# Patient Record
Sex: Female | Born: 1975 | Race: Black or African American | Hispanic: No | Smoking: Former smoker
Health system: Southern US, Community
[De-identification: ages and names within clinical notes are randomized; demographics above are authoritative.]

## PROBLEM LIST (undated history)

## (undated) DIAGNOSIS — N809 Endometriosis, unspecified: Secondary | ICD-10-CM

## (undated) DIAGNOSIS — S83106A Unspecified dislocation of unspecified knee, initial encounter: Secondary | ICD-10-CM

## (undated) DIAGNOSIS — G5600 Carpal tunnel syndrome, unspecified upper limb: Secondary | ICD-10-CM

## (undated) DIAGNOSIS — K5900 Constipation, unspecified: Secondary | ICD-10-CM

## (undated) DIAGNOSIS — I10 Essential (primary) hypertension: Secondary | ICD-10-CM

## (undated) DIAGNOSIS — G459 Transient cerebral ischemic attack, unspecified: Secondary | ICD-10-CM

## (undated) DIAGNOSIS — K5790 Diverticulosis of intestine, part unspecified, without perforation or abscess without bleeding: Secondary | ICD-10-CM

## (undated) DIAGNOSIS — I639 Cerebral infarction, unspecified: Secondary | ICD-10-CM

## (undated) DIAGNOSIS — G9332 Myalgic encephalomyelitis/chronic fatigue syndrome: Secondary | ICD-10-CM

## (undated) DIAGNOSIS — M549 Dorsalgia, unspecified: Secondary | ICD-10-CM

## (undated) DIAGNOSIS — I83893 Varicose veins of bilateral lower extremities with other complications: Secondary | ICD-10-CM

## (undated) DIAGNOSIS — R079 Chest pain, unspecified: Secondary | ICD-10-CM

## (undated) DIAGNOSIS — N80129 Deep endometriosis of ovary, unspecified ovary: Secondary | ICD-10-CM

## (undated) DIAGNOSIS — M779 Enthesopathy, unspecified: Secondary | ICD-10-CM

## (undated) DIAGNOSIS — R002 Palpitations: Secondary | ICD-10-CM

## (undated) DIAGNOSIS — R1310 Dysphagia, unspecified: Secondary | ICD-10-CM

## (undated) DIAGNOSIS — M2550 Pain in unspecified joint: Secondary | ICD-10-CM

## (undated) DIAGNOSIS — M797 Fibromyalgia: Secondary | ICD-10-CM

## (undated) DIAGNOSIS — F419 Anxiety disorder, unspecified: Secondary | ICD-10-CM

## (undated) DIAGNOSIS — K589 Irritable bowel syndrome without diarrhea: Secondary | ICD-10-CM

## (undated) DIAGNOSIS — O2230 Deep phlebothrombosis in pregnancy, unspecified trimester: Secondary | ICD-10-CM

## (undated) DIAGNOSIS — M1990 Unspecified osteoarthritis, unspecified site: Secondary | ICD-10-CM

## (undated) DIAGNOSIS — G589 Mononeuropathy, unspecified: Secondary | ICD-10-CM

## (undated) DIAGNOSIS — R0602 Shortness of breath: Secondary | ICD-10-CM

## (undated) DIAGNOSIS — R5382 Chronic fatigue, unspecified: Secondary | ICD-10-CM

## (undated) DIAGNOSIS — G4730 Sleep apnea, unspecified: Secondary | ICD-10-CM

## (undated) DIAGNOSIS — E7800 Pure hypercholesterolemia, unspecified: Secondary | ICD-10-CM

## (undated) DIAGNOSIS — K760 Fatty (change of) liver, not elsewhere classified: Secondary | ICD-10-CM

## (undated) DIAGNOSIS — K829 Disease of gallbladder, unspecified: Secondary | ICD-10-CM

## (undated) DIAGNOSIS — U071 COVID-19: Secondary | ICD-10-CM

## (undated) DIAGNOSIS — E119 Type 2 diabetes mellitus without complications: Secondary | ICD-10-CM

## (undated) DIAGNOSIS — N3010 Interstitial cystitis (chronic) without hematuria: Secondary | ICD-10-CM

## (undated) DIAGNOSIS — E559 Vitamin D deficiency, unspecified: Secondary | ICD-10-CM

## (undated) DIAGNOSIS — F32A Depression, unspecified: Secondary | ICD-10-CM

## (undated) DIAGNOSIS — M5412 Radiculopathy, cervical region: Secondary | ICD-10-CM

## (undated) DIAGNOSIS — R12 Heartburn: Secondary | ICD-10-CM

## (undated) DIAGNOSIS — M7989 Other specified soft tissue disorders: Secondary | ICD-10-CM

## (undated) DIAGNOSIS — D649 Anemia, unspecified: Secondary | ICD-10-CM

## (undated) DIAGNOSIS — I82409 Acute embolism and thrombosis of unspecified deep veins of unspecified lower extremity: Secondary | ICD-10-CM

## (undated) HISTORY — DX: Sleep apnea, unspecified: G47.30

## (undated) HISTORY — DX: Dorsalgia, unspecified: M54.9

## (undated) HISTORY — DX: Dysphagia, unspecified: R13.10

## (undated) HISTORY — DX: Vitamin D deficiency, unspecified: E55.9

## (undated) HISTORY — DX: Palpitations: R00.2

## (undated) HISTORY — DX: Depression, unspecified: F32.A

## (undated) HISTORY — DX: Endometriosis, unspecified: N80.9

## (undated) HISTORY — DX: Enthesopathy, unspecified: M77.9

## (undated) HISTORY — DX: Essential (primary) hypertension: I10

## (undated) HISTORY — DX: Unspecified osteoarthritis, unspecified site: M19.90

## (undated) HISTORY — DX: Disease of gallbladder, unspecified: K82.9

## (undated) HISTORY — DX: Other specified soft tissue disorders: M79.89

## (undated) HISTORY — DX: Carpal tunnel syndrome, unspecified upper limb: G56.00

## (undated) HISTORY — DX: Chronic fatigue, unspecified: R53.82

## (undated) HISTORY — DX: Transient cerebral ischemic attack, unspecified: G45.9

## (undated) HISTORY — DX: Pure hypercholesterolemia, unspecified: E78.00

## (undated) HISTORY — DX: Myalgic encephalomyelitis/chronic fatigue syndrome: G93.32

## (undated) HISTORY — DX: Anxiety disorder, unspecified: F41.9

## (undated) HISTORY — DX: Heartburn: R12

## (undated) HISTORY — DX: Acute embolism and thrombosis of unspecified deep veins of unspecified lower extremity: I82.409

## (undated) HISTORY — DX: Cerebral infarction, unspecified: I63.9

## (undated) HISTORY — DX: Radiculopathy, cervical region: M54.12

## (undated) HISTORY — DX: Varicose veins of bilateral lower extremities with other complications: I83.893

## (undated) HISTORY — DX: Fatty (change of) liver, not elsewhere classified: K76.0

## (undated) HISTORY — DX: Unspecified dislocation of unspecified knee, initial encounter: S83.106A

## (undated) HISTORY — DX: Constipation, unspecified: K59.00

## (undated) HISTORY — DX: Chest pain, unspecified: R07.9

## (undated) HISTORY — DX: Anemia, unspecified: D64.9

## (undated) HISTORY — DX: Mononeuropathy, unspecified: G58.9

## (undated) HISTORY — DX: Deep endometriosis of ovary, unspecified ovary: N80.129

## (undated) HISTORY — DX: Shortness of breath: R06.02

## (undated) HISTORY — DX: Pain in unspecified joint: M25.50

---

## 1994-01-09 HISTORY — PX: CHOLECYSTECTOMY: SHX55

## 2007-07-25 ENCOUNTER — Emergency Department: Payer: Self-pay | Admitting: Emergency Medicine

## 2007-11-08 ENCOUNTER — Emergency Department: Payer: Self-pay | Admitting: Emergency Medicine

## 2008-02-10 ENCOUNTER — Emergency Department: Payer: Self-pay | Admitting: Emergency Medicine

## 2008-02-21 ENCOUNTER — Inpatient Hospital Stay: Payer: Self-pay | Admitting: Otolaryngology

## 2008-03-06 ENCOUNTER — Ambulatory Visit: Payer: Self-pay | Admitting: Specialist

## 2008-04-21 ENCOUNTER — Ambulatory Visit: Payer: Self-pay | Admitting: Family

## 2008-07-17 ENCOUNTER — Ambulatory Visit: Payer: Self-pay | Admitting: Otolaryngology

## 2008-07-27 ENCOUNTER — Ambulatory Visit: Payer: Self-pay | Admitting: Otolaryngology

## 2008-10-21 ENCOUNTER — Emergency Department: Payer: Self-pay | Admitting: Emergency Medicine

## 2009-02-25 ENCOUNTER — Ambulatory Visit: Payer: Self-pay | Admitting: Family Medicine

## 2009-10-06 ENCOUNTER — Emergency Department: Payer: Self-pay | Admitting: Emergency Medicine

## 2010-06-06 ENCOUNTER — Ambulatory Visit: Payer: Self-pay | Admitting: Family Medicine

## 2010-06-20 ENCOUNTER — Ambulatory Visit: Payer: Self-pay | Admitting: Family Medicine

## 2011-12-12 ENCOUNTER — Emergency Department: Payer: Self-pay | Admitting: Emergency Medicine

## 2011-12-12 LAB — URINALYSIS, COMPLETE
Bilirubin,UR: NEGATIVE
Ketone: NEGATIVE
Nitrite: NEGATIVE
Ph: 5 (ref 4.5–8.0)
Protein: NEGATIVE
RBC,UR: 1 /HPF (ref 0–5)
Specific Gravity: 1.018 (ref 1.003–1.030)

## 2011-12-12 LAB — COMPREHENSIVE METABOLIC PANEL
Albumin: 3.6 g/dL (ref 3.4–5.0)
Alkaline Phosphatase: 123 U/L (ref 50–136)
BUN: 10 mg/dL (ref 7–18)
Bilirubin,Total: 0.7 mg/dL (ref 0.2–1.0)
Chloride: 106 mmol/L (ref 98–107)
Creatinine: 0.73 mg/dL (ref 0.60–1.30)
Glucose: 108 mg/dL — ABNORMAL HIGH (ref 65–99)
Osmolality: 275 (ref 275–301)
SGPT (ALT): 42 U/L (ref 12–78)
Total Protein: 7.9 g/dL (ref 6.4–8.2)

## 2011-12-12 LAB — CBC
HGB: 12 g/dL (ref 12.0–16.0)
MCH: 26.1 pg (ref 26.0–34.0)
MCHC: 31.7 g/dL — ABNORMAL LOW (ref 32.0–36.0)
MCV: 82 fL (ref 80–100)
RBC: 4.6 10*6/uL (ref 3.80–5.20)
RDW: 15.7 % — ABNORMAL HIGH (ref 11.5–14.5)

## 2011-12-12 LAB — PREGNANCY, URINE: Pregnancy Test, Urine: NEGATIVE m[IU]/mL

## 2013-02-05 ENCOUNTER — Emergency Department: Payer: Self-pay | Admitting: Emergency Medicine

## 2013-10-23 ENCOUNTER — Emergency Department: Payer: Self-pay | Admitting: Emergency Medicine

## 2013-10-26 LAB — BETA STREP CULTURE(ARMC)

## 2014-01-18 ENCOUNTER — Emergency Department: Payer: Self-pay | Admitting: Student

## 2014-01-18 LAB — ED INFLUENZA
H1N1FLUPCR: NOT DETECTED
INFLAPCR: NEGATIVE
Influenza B By PCR: NEGATIVE

## 2014-01-18 LAB — URINALYSIS, COMPLETE
Bacteria: NONE SEEN
Bilirubin,UR: NEGATIVE
Glucose,UR: NEGATIVE mg/dL (ref 0–75)
Ketone: NEGATIVE
LEUKOCYTE ESTERASE: NEGATIVE
NITRITE: NEGATIVE
PH: 5 (ref 4.5–8.0)
Protein: NEGATIVE
RBC,UR: <1 /HPF (ref 0–5)
Specific Gravity: 1.019 (ref 1.003–1.030)
Squamous Epithelial: 2
WBC UR: 2 /HPF (ref 0–5)

## 2014-03-03 ENCOUNTER — Ambulatory Visit: Payer: Self-pay | Admitting: Family Medicine

## 2014-03-11 ENCOUNTER — Ambulatory Visit: Payer: Self-pay | Admitting: Family Medicine

## 2014-03-20 ENCOUNTER — Ambulatory Visit: Payer: Self-pay

## 2014-04-20 ENCOUNTER — Ambulatory Visit
Admit: 2014-04-20 | Disposition: A | Discharge status: Home / Self Care | Payer: Self-pay | Attending: Urology | Admitting: Urology

## 2014-04-20 LAB — CBC
HCT: 37.5 % (ref 35.0–47.0)
HGB: 11.7 g/dL — ABNORMAL LOW (ref 12.0–16.0)
MCH: 25.7 pg — ABNORMAL LOW (ref 26.0–34.0)
MCHC: 31.3 g/dL — ABNORMAL LOW (ref 32.0–36.0)
MCV: 82 fL (ref 80–100)
Platelet: 383 10*3/uL (ref 150–440)
RBC: 4.56 10*6/uL (ref 3.80–5.20)
RDW: 16.1 % — AB (ref 11.5–14.5)
WBC: 11.8 10*3/uL — AB (ref 3.6–11.0)

## 2014-04-20 LAB — BASIC METABOLIC PANEL
Anion Gap: 7 (ref 7–16)
BUN: 10 mg/dL
CREATININE: 0.59 mg/dL
Calcium, Total: 9 mg/dL
Chloride: 103 mmol/L
Co2: 30 mmol/L
Glucose: 111 mg/dL — ABNORMAL HIGH
Potassium: 3.7 mmol/L
SODIUM: 140 mmol/L

## 2014-05-10 NOTE — Op Note (Signed)
PATIENT NAME:  Jennifer Dickson, DIPERNA MR#:  [id number] DATE OF BIRTH:  10-Jan-1975  PREOPERATIVE DIAGNOSIS:  Microhematuria.   POSTOPERATIVE DIAGNOSIS:  Microhematuria.   PROCEDURE: cystoscopy bilateral retrograde pyelogram.   FINDINGS:  Normal bilateral retrograde pyelogram.   ANESTHESIA:  General.   PROCEDURE IN DETAIL:  The patient was sterilely prepped and draped after being placed in the supine lithotomy position.  Appropriate timeout was done.  After the appropriate timeout, we did a cystoscopy.  Cystoscopy revealed a normal-appearing bladder with the ureters in the B position, so the patient may actually have minor reflux as a cause of her microhematuria.  A bilateral retrograde was done utilizing a 5-French ureteral catheter.  The ureters have small lumen, no filling defects, normal all the way, and no filling defects in the kidneys.  Both ureters empty promptly and rapidly.  The rest of the bladder, as I said, appeared normal and no tumors, masses, trabeculation, or evidence of neurogenic disease.  The rectal exam revealed no tumors, masses, or growths in the rectum.  This was done at the end of the procedure for the B and O suppository placement.  At the end of the procedure, the bladder was emptied and 30 mL of 0.5% Marcaine were placed in the bladder with the B and O suppository being placed in the rectum.  She was sent to recovery in satisfactory condition.    ____________________________ Janice Coffin. Elnoria Howard, [place] rdh:kc D: 04/20/2014 13:59:23 ET T: 04/20/2014 14:34:36 ET JOB#: [id number]  cc: Janice Coffin. Elnoria Howard, DO, <Dictator> Miku Udall D Jovanka Westgate DO ELECTRONICALLY SIGNED 05/04/2014 10:24

## 2014-10-09 ENCOUNTER — Emergency Department (INDEPENDENT_AMBULATORY_CARE_PROVIDER_SITE_OTHER)
Admission: EM | Admit: 2014-10-09 | Discharge: 2014-10-09 | Disposition: A | Discharge status: Nursing Home (Includes ICF) | Payer: Medicaid Other | Source: Home / Self Care | Attending: Emergency Medicine | Admitting: Emergency Medicine

## 2014-10-09 ENCOUNTER — Emergency Department (HOSPITAL_COMMUNITY): Payer: Medicaid Other

## 2014-10-09 ENCOUNTER — Encounter (HOSPITAL_COMMUNITY): Payer: Self-pay | Admitting: Emergency Medicine

## 2014-10-09 ENCOUNTER — Emergency Department (HOSPITAL_COMMUNITY)
Admission: EM | Admit: 2014-10-09 | Discharge: 2014-10-10 | Disposition: A | Discharge status: Home / Self Care | Payer: Medicaid Other | Attending: Emergency Medicine | Admitting: Emergency Medicine

## 2014-10-09 ENCOUNTER — Encounter (HOSPITAL_COMMUNITY): Payer: Self-pay | Admitting: *Deleted

## 2014-10-09 DIAGNOSIS — R079 Chest pain, unspecified: Secondary | ICD-10-CM

## 2014-10-09 DIAGNOSIS — E119 Type 2 diabetes mellitus without complications: Secondary | ICD-10-CM | POA: Insufficient documentation

## 2014-10-09 DIAGNOSIS — Z79899 Other long term (current) drug therapy: Secondary | ICD-10-CM | POA: Diagnosis not present

## 2014-10-09 DIAGNOSIS — T754XXA Electrocution, initial encounter: Secondary | ICD-10-CM

## 2014-10-09 DIAGNOSIS — Z87891 Personal history of nicotine dependence: Secondary | ICD-10-CM | POA: Insufficient documentation

## 2014-10-09 HISTORY — DX: Type 2 diabetes mellitus without complications: E11.9

## 2014-10-09 HISTORY — DX: Interstitial cystitis (chronic) without hematuria: N30.10

## 2014-10-09 LAB — POCT I-STAT, CHEM 8
BUN: 10 mg/dL (ref 6–20)
Calcium, Ion: 1.14 mmol/L (ref 1.12–1.23)
Chloride: 103 mmol/L (ref 101–111)
Creatinine, Ser: 0.7 mg/dL (ref 0.44–1.00)
Glucose, Bld: 103 mg/dL — ABNORMAL HIGH (ref 65–99)
HCT: 41 % (ref 36.0–46.0)
Hemoglobin: 13.9 g/dL (ref 12.0–15.0)
Potassium: 3.6 mmol/L (ref 3.5–5.1)
SODIUM: 140 mmol/L (ref 135–145)
TCO2: 26 mmol/L (ref 0–100)

## 2014-10-09 LAB — POCT URINALYSIS DIP (DEVICE)
BILIRUBIN URINE: NEGATIVE
GLUCOSE, UA: NEGATIVE mg/dL
Ketones, ur: NEGATIVE mg/dL
Leukocytes, UA: NEGATIVE
Nitrite: NEGATIVE
PROTEIN: NEGATIVE mg/dL
Urobilinogen, UA: 0.2 mg/dL (ref 0.0–1.0)
pH: 5.5 (ref 5.0–8.0)

## 2014-10-09 LAB — CBC WITH DIFFERENTIAL/PLATELET
Basophils Absolute: 0 10*3/uL (ref 0.0–0.1)
Basophils Relative: 0 %
Eosinophils Absolute: 0.1 10*3/uL (ref 0.0–0.7)
Eosinophils Relative: 1 %
HCT: 38.1 % (ref 36.0–46.0)
Hemoglobin: 12 g/dL (ref 12.0–15.0)
Lymphocytes Relative: 28 %
Lymphs Abs: 3.1 10*3/uL (ref 0.7–4.0)
MCH: 26 pg (ref 26.0–34.0)
MCHC: 31.5 g/dL (ref 30.0–36.0)
MCV: 82.6 fL (ref 78.0–100.0)
Monocytes Absolute: 0.5 10*3/uL (ref 0.1–1.0)
Monocytes Relative: 5 %
Neutro Abs: 7.4 10*3/uL (ref 1.7–7.7)
Neutrophils Relative %: 66 %
Platelets: 436 10*3/uL — ABNORMAL HIGH (ref 150–400)
RBC: 4.61 MIL/uL (ref 3.87–5.11)
RDW: 15.7 % — ABNORMAL HIGH (ref 11.5–15.5)
WBC: 11.1 10*3/uL — ABNORMAL HIGH (ref 4.0–10.5)

## 2014-10-09 LAB — BASIC METABOLIC PANEL
Anion gap: 8 (ref 5–15)
BUN: 7 mg/dL (ref 6–20)
CO2: 28 mmol/L (ref 22–32)
Calcium: 8.9 mg/dL (ref 8.9–10.3)
Chloride: 100 mmol/L — ABNORMAL LOW (ref 101–111)
Creatinine, Ser: 0.64 mg/dL (ref 0.44–1.00)
GFR calc Af Amer: >60 mL/min (ref >60)
GFR calc non Af Amer: >60 mL/min (ref >60)
Glucose, Bld: 100 mg/dL — ABNORMAL HIGH (ref 65–99)
Potassium: 3.3 mmol/L — ABNORMAL LOW (ref 3.5–5.1)
Sodium: 136 mmol/L (ref 135–145)

## 2014-10-09 LAB — I-STAT TROPONIN, ED: Troponin i, poc: 0 ng/mL (ref 0.00–0.08)

## 2014-10-09 LAB — CK TOTAL AND CKMB (NOT AT ARMC)
CK, MB: 1.6 ng/mL (ref 0.5–5.0)
RELATIVE INDEX: 1 (ref 0.0–2.5)
Total CK: 156 U/L (ref 38–234)

## 2014-10-09 LAB — TROPONIN I: Troponin I: 0.04 ng/mL — ABNORMAL HIGH (ref <0.031)

## 2014-10-09 MED ORDER — SODIUM CHLORIDE 0.9 % IJ SOLN
INTRAMUSCULAR | Status: AC
  Filled 2014-10-09: qty 10

## 2014-10-09 MED ORDER — LORAZEPAM 2 MG/ML IJ SOLN
0.5000 mg | Freq: Once | INTRAMUSCULAR | Status: AC
  Administered 2014-10-09: 0.5 mg via INTRAVENOUS
  Filled 2014-10-09: qty 1

## 2014-10-09 MED ORDER — KETOROLAC TROMETHAMINE 15 MG/ML IJ SOLN
15.0000 mg | Freq: Once | INTRAMUSCULAR | Status: AC
  Administered 2014-10-09: 15 mg via INTRAVENOUS
  Filled 2014-10-09: qty 1

## 2014-10-09 MED ORDER — ASPIRIN 81 MG PO CHEW
324.0000 mg | CHEWABLE_TABLET | Freq: Once | ORAL | Status: DC
Start: 2014-10-09 — End: 2014-10-09

## 2014-10-09 NOTE — ED Notes (Signed)
Patient has pain in center chest and right side, axilla line. Patient is currently hyperventilating.  Patient reports plugging in an extension cord and feeling shock. Patient felt like shock went through torso, from left to right.   Reports hand being black initially.  Patient's hand is not discolored at all, no break in skin integrity.  Right side does not have any injury.  No visible injury to skin or surrounding tissues.

## 2014-10-09 NOTE — ED Provider Notes (Signed)
CSN: [id number]     Arrival date & time 10/09/14  1732 History   First MD Initiated Contact with Patient 10/09/14 1810     Chief Complaint  Patient presents with  . Chest Pain   (Consider location/radiation/quality/duration/timing/severity/associated sxs/prior Treatment) HPI  She is a 39 year old woman here for evaluation of chest pain. She states she was electrocuted last night around 11 PM. She reports holding an extension cord and she didn't realize there was a break in the cord. She was holding the cord in her left hand. She could feel it go through to her right arm and right leg. She denies any loss of consciousness or fall. She states her left hand was black immediately afterwards, but this washed off this morning. Today, she has burning pain in her central chest and right axillary line. She also reports her chest feels tight. She describes some intermittent cramping in her right leg.  Past Medical History  Diagnosis Date  . Interstitial cystitis    Past Surgical History  Procedure Laterality Date  . Cholecystectomy     No family history on file. Social History  Substance Use Topics  . Smoking status: Never Smoker   . Smokeless tobacco: None  . Alcohol Use: No   OB History    No data available     Review of Systems As in history of present illness Allergies  Augmentin and Flexeril  Home Medications   Prior to Admission medications   Not on File   Meds Ordered and Administered this Visit  Medications - No data to display  BP 146/85 mmHg  Pulse 95  Temp(Src) 99.3 F (37.4 C) (Oral)  Resp 16  SpO2 98%  LMP 10/08/2014 No data found.   Physical Exam  Constitutional: She is oriented to person, place, and time. She appears well-developed and well-nourished. She appears distressed.  Neck: Neck supple.  Cardiovascular: Normal rate, regular rhythm and normal heart sounds.   No murmur heard. Pulmonary/Chest: She exhibits tenderness.    Patient is tachypneic,  but normal work of breathing. Lung sounds are clear.  Musculoskeletal: She exhibits no edema.  Neurological: She is alert and oriented to person, place, and time. She exhibits normal muscle tone.  Skin: Skin is warm and dry.  No burns. Skin is intact.    ED Course  Procedures (including critical care time) ED ECG REPORT   Date: 10/09/2014  Rate: 80  Rhythm: normal sinus rhythm  QRS Axis: normal  Intervals: normal  ST/T Wave abnormalities: normal  Conduction Disutrbances:none  Narrative Interpretation: normal ekg  Old EKG Reviewed: unchanged  I have personally reviewed the EKG tracing and agree with the computerized printout as noted.   Labs Review Labs Reviewed  POCT URINALYSIS DIP (DEVICE) - Abnormal; Notable for the following:    Hgb urine dipstick TRACE (*)    All other components within normal limits  CK TOTAL AND CKMB (NOT AT [facility])    Imaging Review No results found.  MDM   1. Chest pain, unspecified chest pain type   2. Electric shock, initial encounter    Spoke with Dr. Ralene Bathe in the emergency room.  Discussed that the electric current was likely not enough to cause significant damage, however a 39 year old woman with chest pain likely needs to be evaluated with troponins. She does have trace hemoglobin in her urine, this raises the possibility of muscle breakdown from electric shock.  I-STAT and CK levels drawn. We'll transfer to [facility] ED via EMS  for additional monitoring and evaluation.    Melony Overly, MD 10/09/14 1910

## 2014-10-09 NOTE — ED Notes (Signed)
Reportedly was shocked w an electrical card last PM, and c/o she has felt faint due to the pain. Drove herself to the [facility]. States she had the cord in her left hand, but the pain is all on her right side

## 2014-10-09 NOTE — ED Notes (Signed)
Pt states she was electocuted last night when plugging in a frayed cord.  Pt states she was being shocked for a "couple of minutes" before letting go.  Pt states she had brown marks on her left hand that are not noticeable now.  Pt states chest pain since last night central chest 5/10 radiates to right side of chest and upper middle abdomin.  Pt states she feels SOB (99%RA).  Pt was given 324 asp, 1 nitro ( helped chest pain but gave her a HA).  EMS 130/9, 90, 100% RA, 16 resp.  18 R AC

## 2014-10-09 NOTE — ED Notes (Signed)
Pt returned to room from xray.

## 2014-10-09 NOTE — ED Notes (Signed)
[facility] notified 

## 2014-10-09 NOTE — Discharge Instructions (Signed)

## 2014-10-09 NOTE — ED Notes (Signed)
Spoke to [facility], reported they have contacted ems to get patient

## 2014-10-09 NOTE — ED Notes (Signed)
Patient is nauseated

## 2014-10-09 NOTE — ED Notes (Signed)
Off unit with Xray. 

## 2014-10-10 NOTE — ED Provider Notes (Signed)
CSN: [id number]     Arrival date & time 10/09/14  2021 History   First MD Initiated Contact with Patient 10/09/14 2040     Chief Complaint  Patient presents with  . Chest Pain     (Consider location/radiation/quality/duration/timing/severity/associated sxs/prior Treatment) HPI   38yF with CP. Noticed this morning when she woke up and has been persist since. Burning in the center of her chest which has been constant. Occasionally gets sharp pain in R axillary region, just below and lateral to R breast. Comes and goes "before I even have a chance to yell." She seems to relate it to electrocuting herself after grabbing an electrical cord last night around 2300.  Past Medical History  Diagnosis Date  . Interstitial cystitis   . Diabetes mellitus without complication    Past Surgical History  Procedure Laterality Date  . Cholecystectomy     History reviewed. No pertinent family history. Social History  Substance Use Topics  . Smoking status: Former Smoker -- 5 years  . Smokeless tobacco: None  . Alcohol Use: No   OB History    No data available     Review of Systems  All systems reviewed and negative, other than as noted in HPI.   Allergies  Shellfish allergy; Augmentin; and Flexeril  Home Medications   Prior to Admission medications   Medication Sig Start Date End Date Taking? Authorizing Provider  acetaminophen-codeine (TYLENOL #3) 300-30 MG tablet Take 1-2 tablets by mouth every 4 (four) hours as needed for moderate pain.   Yes Historical Provider, MD  esomeprazole (NEXIUM) 40 MG capsule Take 40 mg by mouth daily. 11/21/13  Yes Historical Provider, MD  fluticasone (FLONASE) 50 MCG/ACT nasal spray Place 2 sprays into the nose daily as needed for allergies or rhinitis.  03/10/14 03/10/15 Yes Historical Provider, MD  Meth-Hyo-M Barnett Hatter Phos-Ph Sal (URIBEL) 118 MG CAPS Take 118 mg by mouth daily as needed (urinary pain).   Yes Historical Provider, MD  prochlorperazine (COMPAZINE)  5 MG tablet Take 5 mg by mouth every 6 (six) hours as needed for nausea or vomiting.   Yes Historical Provider, MD  topiramate (TOPAMAX) 25 MG tablet Take 25 mg by mouth at bedtime as needed (headaches).  03/10/14  Yes Historical Provider, MD   BP 125/77 mmHg  Pulse 84  Temp(Src) 98.3 F (36.8 C) (Oral)  Resp 20  SpO2 99%  LMP 10/05/2014 Physical Exam  Constitutional: She appears well-developed and well-nourished. No distress.  HENT:  Head: Normocephalic and atraumatic.  Eyes: Conjunctivae are normal. Right eye exhibits no discharge. Left eye exhibits no discharge.  Neck: Neck supple.  Cardiovascular: Normal rate, regular rhythm and normal heart sounds.  Exam reveals no gallop and no friction rub.   No murmur heard. Pulmonary/Chest: Effort normal and breath sounds normal. No respiratory distress.  TTP over sternum. No concerning skin changes. Cannot reproduce pain r axillary region with palpation.   Abdominal: Soft. She exhibits no distension.  Musculoskeletal: She exhibits no edema or tenderness.  Lower extremities symmetric as compared to each other. No calf tenderness. Negative Homan's. No palpable cords.   Neurological: She is alert.  Skin: Skin is warm and dry.  Psychiatric: She has a normal mood and affect. Her behavior is normal. Thought content normal.  Nursing note and vitals reviewed.   ED Course  Procedures (including critical care time) Labs Review Labs Reviewed  CBC WITH DIFFERENTIAL/PLATELET - Abnormal; Notable for the following:    WBC 11.1 (*)  RDW 15.7 (*)    Platelets 436 (*)    All other components within normal limits  BASIC METABOLIC PANEL - Abnormal; Notable for the following:    Potassium 3.3 (*)    Chloride 100 (*)    Glucose, Bld 100 (*)    All other components within normal limits  TROPONIN I - Abnormal; Notable for the following:    Troponin I 0.04 (*)    All other components within normal limits  I-STAT TROPOININ, ED    Imaging Review Dg  Chest 2 View  10/09/2014   CLINICAL DATA:  Status post accidental electrocution. Right-sided chest pain and shortness of breath. Nausea, headache and dizziness. Initial encounter.  EXAM: CHEST  2 VIEW  COMPARISON:  None.  FINDINGS: The lungs are well-aerated and clear. There is no evidence of focal opacification, pleural effusion or pneumothorax.  The heart is normal in size; the mediastinal contour is within normal limits. No acute osseous abnormalities are seen.  IMPRESSION: No acute cardiopulmonary process seen.   Electronically Signed   By: Garald Balding M.D.   On: 10/09/2014 22:24   I have personally reviewed and evaluated these images and lab results as part of my medical decision-making.   EKG Interpretation   Date/Time:  Friday October 09 2014 20:28:57 EDT Ventricular Rate:  84 PR Interval:  141 QRS Duration: 91 QT Interval:  415 QTC Calculation: 491 R Axis:   81 Text Interpretation:  Sinus rhythm Borderline prolonged QT interval  Non-specific ST-t changes Confirmed by Wilson Singer  MD, Ellia Knowlton ([id number]) on  10/09/2014 10:45:59 PM      MDM   Final diagnoses:  Chest pain, unspecified chest pain type    38yF with CP. Seems very atypical. EKG with nonspecific changes. Doesn't appear significantly changed from prior. CXR clear. CK normal. Troponin just over reference range of normal. I think very atypical for ACS. Symptoms have been ongoing for well over 8 hours. Sternal pain is reproducible. Would suspect to significantly higher if "real." Repeated another which is normal. HEART score of 2. I feel stable for DC at this time.     Virgel Manifold, MD 10/15/14 [phone]

## 2015-05-20 ENCOUNTER — Encounter: Payer: Self-pay | Admitting: *Deleted

## 2015-06-16 ENCOUNTER — Encounter: Payer: Self-pay | Admitting: Obstetrics & Gynecology

## 2015-06-16 ENCOUNTER — Ambulatory Visit (INDEPENDENT_AMBULATORY_CARE_PROVIDER_SITE_OTHER): Payer: Medicaid Other | Admitting: Obstetrics & Gynecology

## 2015-06-16 DIAGNOSIS — R1909 Other intra-abdominal and pelvic swelling, mass and lump: Secondary | ICD-10-CM | POA: Diagnosis not present

## 2015-06-16 DIAGNOSIS — Z6841 Body Mass Index (BMI) 40.0 and over, adult: Secondary | ICD-10-CM | POA: Insufficient documentation

## 2015-06-16 NOTE — Progress Notes (Signed)
Patient ID: Jennifer Dickson, female   DOB: 1975-07-06, 40 y.o.   MRN: [id number]  Chief Complaint  Patient presents with  . Referral    [facility]  RLQ abd wall mass s/p C/S 9 yr ago  HPI Jennifer Dickson is a 40 y.o. female.  [id number] Patient's last menstrual period was 06/01/2015 (exact date). [place] and CT showed RLQ abd wall nodule 3x2cm some pain.  Heavy menses with cramps before and after her flow. Has nausea, epigastric pain and heartburn despite Nexium tx.  HPI  Past Medical History  Diagnosis Date  . Interstitial cystitis   . Diabetes mellitus without complication [facility])     Past Surgical History  Procedure Laterality Date  . Cholecystectomy      Family History  Problem Relation Age of Onset  . Diabetes Mother   . Cancer Mother   . Heart disease Mother   . Heart disease Brother   . Cancer Sister     lung  . Cancer Sister     brain    Social History Social History  Substance Use Topics  . Smoking status: Former Smoker -- 5 years  . Smokeless tobacco: None  . Alcohol Use: No    Allergies  Allergen Reactions  . Shellfish Allergy Anaphylaxis  . Augmentin [Amoxicillin-Pot Clavulanate] Rash  . Flexeril [Cyclobenzaprine] Rash    Current Outpatient Prescriptions  Medication Sig Dispense Refill  . esomeprazole (NEXIUM) 40 MG capsule Take 40 mg by mouth daily.    . Hydrocodone-Acetaminophen 5-300 MG TABS Take 1 tablet by mouth every 8 (eight) hours as needed.    . Meth-Hyo-M Bl-Na Phos-Ph Sal (URIBEL) 118 MG CAPS Take 118 mg by mouth daily as needed (urinary pain).    . naproxen (NAPROSYN) 500 MG tablet Take 500 mg by mouth 2 (two) times daily with a meal.    . promethazine (PHENERGAN) 25 MG tablet Take 25 mg by mouth every 6 (six) hours as needed for nausea or vomiting.    . topiramate (TOPAMAX) 25 MG tablet Take 50 mg by mouth daily.     . traMADol (ULTRAM) 50 MG tablet Take by mouth every 8 (eight) hours as needed.    Marland Kitchen acetaminophen-codeine  (TYLENOL #3) 300-30 MG tablet Take 1-2 tablets by mouth every 4 (four) hours as needed for moderate pain. Reported on 06/16/2015    . fluticasone (FLONASE) 50 MCG/ACT nasal spray Place 2 sprays into the nose daily as needed for allergies or rhinitis.      No current facility-administered medications for this visit.    Review of Systems Review of Systems  Constitutional: Negative.  Negative for unexpected weight change.  Respiratory: Negative.   Gastrointestinal: Positive for nausea and abdominal pain (epigastric). Negative for diarrhea and constipation.       Reflux  Genitourinary: Positive for menstrual problem and pelvic pain. Negative for vaginal bleeding, vaginal discharge and vaginal pain.    Blood pressure 115/68, pulse 84, temperature 98.4 F (36.9 C), temperature source Oral, resp. rate 20, height 5\' 7"  (1.702 m), weight 344 lb 9.6 oz (156.31 kg), last menstrual period 06/01/2015.  Physical Exam Physical Exam  Constitutional: She is oriented to person, place, and time. She appears well-developed. No distress.  obese  Pulmonary/Chest: Effort normal. No respiratory distress.  Abdominal: Soft. There is tenderness (mild).  Palpable mass 3 cm abdominal wall RLQ  Neurological: She is alert and oriented to person, place, and time.  Skin: Skin is warm and dry.  Psychiatric: She has a normal mood and affect. Her behavior is normal.    Data Reviewed  INDICATION: PELVIC NODULE, ABNORMAL ULTRASOUND   TECHNIQUE: Axial scans through the pelvis. No intravenous contrast. Oral contrast administered. Radiation dose reduction was utilized (automated exposure control, mA or kV adjustment based on patient size, or iterative image reconstruction). No prior  CT available for comparison.  Exam date/time: 04/28/2015 12:15 PM   FINDINGS: In the deep subcutaneous tissues in the right lower pelvis anterior to the right anterior abdominal fascia and contiguous with the fascia is a 3.1 x 2.5 cm focal  soft tissue mass versus abnormal lymph node. No adnexal mass. No free fluid. Small umbilical hernia that contains fat. No bowel abnormalities evident.   Impression: 3.1 x 2.5 cm soft tissue mass in the deep subcutaneous tissue of the right lower pelvis located anterior to the anterior pelvic fascia and contiguous with the fascia. The etiology of this mass is indeterminate with possibilities including an  abnormal lymph node versus other soft tissue mass. No adnexal mass or other pelvic abnormality evident.    Electronically Signed by Danella Deis    Radiation dose reduction was utilized (automated exposure control, mA or kV adjustment based on patient size, or iterative image reconstruction  TECHNIQUE: Abdominal ultrasound. Compare None.  Indication: Right lower quadrant and pelvic pain  FINDINGS:   Aorta and IVC: Vessels are normal in caliber..  Pancreas:  partially obscured by gas, normal where visible. Liver:  No focal lesions or dilated bile ducts. Liver is enlarged measuring 21.6 cm in length. Possible fatty infiltration Portal venous flow: Hepatopetal.  Gallbladder: Cholecystectomy.   CBD: Unable to be visualized Right kidney: 14.3 cm. No stones, mass or hydronephrosis Left kidney: 12.2 cm. No stones, mass or hydronephrosis Spleen measures 10.4 cm.   No ascites is present.   IMPRESSION:Enlarged possibly fatty liver. No acute findings noted.  Electronically Signed by Sharin Mons  Assessment    Abnormal CT and [place] result reviewed, no adnexal or uterine mass   Dysmenorrhea and menorrhagia Abdominal wall mass possible seroma Plan    Candidate for OCP, Mirena or ablation. Information given and will RTC to discuss.     Consider general surgery f/u for abdominal wall lesion    Murlene Revell 06/16/2015, 2:57 PM

## 2015-06-16 NOTE — Progress Notes (Signed)
Pt referred due to fibroids and possible pelvic mass.

## 2015-06-16 NOTE — Patient Instructions (Signed)
Endometrial Ablation Endometrial ablation removes the lining of the uterus (endometrium). It is usually a same-day, outpatient treatment. Ablation helps avoid major surgery, such as surgery to remove the cervix and uterus (hysterectomy). After endometrial ablation, you will have little or no menstrual bleeding and may not be able to have children. However, if you are premenopausal, you will need to use a reliable method of birth control following the procedure because of the small chance that pregnancy can occur. There are different reasons to have this procedure. These reasons include:  Heavy periods.  Bleeding that is causing anemia.  Irregular bleeding.  Bleeding fibroids on the lining inside the uterus if they are smaller than 3 centimeters. This procedure may not be possible for you if:   You want to have children in the future.   You have severe cramps with your menstrual period.   You have precancerous or cancerous cells in your uterus.   You were recently pregnant.   You have gone through menopause.   You have had major surgery on your uterus, resulting in thinning of the uterine wall. Surgeries may include:  The removal of one or more uterine fibroids (myomectomy).  A cesarean section with a classic (vertical) incision on your uterus. Ask your health care provider what type of cesarean you had. Sometimes the scar on your skin is different than the scar on your uterus. Even if you have had surgery on your uterus, certain types of ablation may still be safe for you. Talk with your health care provider. LET YOUR HEALTH CARE PROVIDER KNOW ABOUT:  Any allergies you have.  All medicines you are taking, including vitamins, herbs, eye drops, creams, and over-the-counter medicines.  Previous problems you or members of your family have had with the use of anesthetics.  Any blood disorders you have.  Previous surgeries you have had.  Medical conditions you have. RISKS AND  COMPLICATIONS  Generally, this is a safe procedure. However, as with any procedure, complications can occur. Possible complications include:  Perforation of the uterus.  Bleeding.  Infection of the uterus, bladder, or vagina.  Injury to surrounding organs.  An air bubble to the lung (air embolus).  Pregnancy following the procedure.  Failure of the procedure to help the problem, requiring hysterectomy.  Decreased ability to diagnose cancer in the lining of the uterus. BEFORE THE PROCEDURE  The lining of the uterus must be tested to make sure there is no pre-cancerous or cancer cells present.  An ultrasound may be performed to look at the size of the uterus and to check for abnormalities.  Medicines may be given to thin the lining of the uterus. PROCEDURE  During the procedure, your health care provider will use a tool called a resectoscope to help see inside your uterus. There are different ways to remove the lining of your uterus.   Radiofrequency - This method uses a radiofrequency-alternating electric current to remove the lining of the uterus.  Cryotherapy - This method uses extreme cold to freeze the lining of the uterus.  Heated-Free Liquid - This method uses heated salt (saline) solution to remove the lining of the uterus.  Microwave - This method uses high-energy microwaves to heat up the lining of the uterus to remove it.  Thermal balloon - This method involves inserting a catheter with a balloon tip into the uterus. The balloon tip is filled with heated fluid to remove the lining of the uterus. AFTER THE PROCEDURE  After your procedure, do   not have sexual intercourse or insert anything into your vagina until permitted by your health care provider. After the procedure, you may experience:  Cramps.  Vaginal discharge.  Frequent urination.   This information is not intended to replace advice given to you by your health care provider. Make sure you discuss any  questions you have with your health care provider.   Document Released: 11/05/2003 Document Revised: 09/16/2014 Document Reviewed: 05/29/2012 Elsevier Interactive Patient Education 2016 [employer]. Intrauterine Device Information An intrauterine device (IUD) is inserted into your uterus to prevent pregnancy. There are two types of IUDs available:   Copper IUD--This type of IUD is wrapped in copper wire and is placed inside the uterus. Copper makes the uterus and fallopian tubes produce a fluid that kills sperm. The copper IUD can stay in place for 10 years.  Hormone IUD--This type of IUD contains the hormone progestin (synthetic progesterone). The hormone thickens the cervical mucus and prevents sperm from entering the uterus. It also thins the uterine lining to prevent implantation of a fertilized egg. The hormone can weaken or kill the sperm that get into the uterus. One type of hormone IUD can stay in place for 5 years, and another type can stay in place for 3 years. Your health care provider will make sure you are a good candidate for a contraceptive IUD. Discuss with your health care provider the possible side effects.  ADVANTAGES OF AN INTRAUTERINE DEVICE  IUDs are highly effective, reversible, long acting, and low maintenance.   There are no estrogen-related side effects.   An IUD can be used when breastfeeding.   IUDs are not associated with weight gain.   The copper IUD works immediately after insertion.   The hormone IUD works right away if inserted within 7 days of your period starting. You will need to use a backup method of birth control for 7 days if the hormone IUD is inserted at any other time in your cycle.  The copper IUD does not interfere with your female hormones.   The hormone IUD can make heavy menstrual periods lighter and decrease cramping.   The hormone IUD can be used for 3 or 5 years.   The copper IUD can be used for 10 years. DISADVANTAGES OF AN  INTRAUTERINE DEVICE  The hormone IUD can be associated with irregular bleeding patterns.   The copper IUD can make your menstrual flow heavier and more painful.   You may experience cramping and vaginal bleeding after insertion.    This information is not intended to replace advice given to you by your health care provider. Make sure you discuss any questions you have with your health care provider.   Document Released: 11/30/2003 Document Revised: 08/28/2012 Document Reviewed: 06/16/2012 Elsevier Interactive Patient Education [employer].

## 2015-07-19 ENCOUNTER — Ambulatory Visit: Payer: Medicaid Other | Admitting: Obstetrics & Gynecology

## 2015-10-12 ENCOUNTER — Other Ambulatory Visit: Payer: Self-pay | Admitting: Vascular Surgery

## 2015-10-12 DIAGNOSIS — I83893 Varicose veins of bilateral lower extremities with other complications: Secondary | ICD-10-CM

## 2015-10-28 ENCOUNTER — Emergency Department (HOSPITAL_COMMUNITY)
Admission: EM | Admit: 2015-10-28 | Discharge: 2015-10-29 | Disposition: A | Discharge status: Home / Self Care | Payer: Medicaid Other | Attending: Emergency Medicine | Admitting: Emergency Medicine

## 2015-10-28 ENCOUNTER — Encounter (HOSPITAL_COMMUNITY): Payer: Self-pay | Admitting: Emergency Medicine

## 2015-10-28 DIAGNOSIS — Z87891 Personal history of nicotine dependence: Secondary | ICD-10-CM | POA: Diagnosis not present

## 2015-10-28 DIAGNOSIS — R51 Headache: Secondary | ICD-10-CM | POA: Diagnosis present

## 2015-10-28 DIAGNOSIS — R1030 Lower abdominal pain, unspecified: Secondary | ICD-10-CM | POA: Insufficient documentation

## 2015-10-28 DIAGNOSIS — D649 Anemia, unspecified: Secondary | ICD-10-CM | POA: Insufficient documentation

## 2015-10-28 DIAGNOSIS — G43809 Other migraine, not intractable, without status migrainosus: Secondary | ICD-10-CM | POA: Insufficient documentation

## 2015-10-28 DIAGNOSIS — E119 Type 2 diabetes mellitus without complications: Secondary | ICD-10-CM | POA: Insufficient documentation

## 2015-10-28 DIAGNOSIS — R1011 Right upper quadrant pain: Secondary | ICD-10-CM | POA: Diagnosis not present

## 2015-10-28 DIAGNOSIS — E876 Hypokalemia: Secondary | ICD-10-CM | POA: Diagnosis not present

## 2015-10-28 HISTORY — DX: Fibromyalgia: M79.7

## 2015-10-28 HISTORY — DX: Diverticulosis of intestine, part unspecified, without perforation or abscess without bleeding: K57.90

## 2015-10-28 HISTORY — DX: Unspecified osteoarthritis, unspecified site: M19.90

## 2015-10-28 HISTORY — DX: Irritable bowel syndrome, unspecified: K58.9

## 2015-10-28 MED ORDER — PROCHLORPERAZINE EDISYLATE 5 MG/ML IJ SOLN
10.0000 mg | Freq: Once | INTRAMUSCULAR | Status: AC
  Administered 2015-10-29: 10 mg via INTRAVENOUS
  Filled 2015-10-28: qty 2

## 2015-10-28 MED ORDER — SODIUM CHLORIDE 0.9 % IV BOLUS (SEPSIS)
1000.0000 mL | Freq: Once | INTRAVENOUS | Status: AC
  Administered 2015-10-29: 1000 mL via INTRAVENOUS

## 2015-10-28 MED ORDER — DIPHENHYDRAMINE HCL 50 MG/ML IJ SOLN
12.5000 mg | Freq: Once | INTRAMUSCULAR | Status: AC
  Administered 2015-10-29: 12.5 mg via INTRAVENOUS
  Filled 2015-10-28: qty 1

## 2015-10-28 NOTE — ED Triage Notes (Signed)
Per EMS, pt from home with c/o migraine x 3 days getting progressively worse. Pt having light sensitivity and nausea. Pt received 4mg  zofran  PTA. CBG-110, BP-134/85, SpO2-98% ra, HR-91

## 2015-10-29 LAB — CBC WITH DIFFERENTIAL/PLATELET
BASOS ABS: 0 10*3/uL (ref 0.0–0.1)
Basophils Relative: 0 %
EOS ABS: 0.1 10*3/uL (ref 0.0–0.7)
EOS PCT: 1 %
HCT: 36.8 % (ref 36.0–46.0)
Hemoglobin: 11.4 g/dL — ABNORMAL LOW (ref 12.0–15.0)
LYMPHS ABS: 2.5 10*3/uL (ref 0.7–4.0)
LYMPHS PCT: 23 %
MCH: 25 pg — AB (ref 26.0–34.0)
MCHC: 31 g/dL (ref 30.0–36.0)
MCV: 80.7 fL (ref 78.0–100.0)
MONO ABS: 0.6 10*3/uL (ref 0.1–1.0)
Monocytes Relative: 5 %
Neutro Abs: 7.9 10*3/uL — ABNORMAL HIGH (ref 1.7–7.7)
Neutrophils Relative %: 71 %
PLATELETS: 488 10*3/uL — AB (ref 150–400)
RBC: 4.56 MIL/uL (ref 3.87–5.11)
RDW: 17.4 % — AB (ref 11.5–15.5)
WBC: 11 10*3/uL — AB (ref 4.0–10.5)

## 2015-10-29 LAB — COMPREHENSIVE METABOLIC PANEL
ALT: 20 U/L (ref 14–54)
ANION GAP: 5 (ref 5–15)
AST: 15 U/L (ref 15–41)
Albumin: 3.3 g/dL — ABNORMAL LOW (ref 3.5–5.0)
Alkaline Phosphatase: 88 U/L (ref 38–126)
BUN: 9 mg/dL (ref 6–20)
CHLORIDE: 103 mmol/L (ref 101–111)
CO2: 29 mmol/L (ref 22–32)
Calcium: 9.3 mg/dL (ref 8.9–10.3)
Creatinine, Ser: 0.74 mg/dL (ref 0.44–1.00)
Glucose, Bld: 117 mg/dL — ABNORMAL HIGH (ref 65–99)
POTASSIUM: 3.4 mmol/L — AB (ref 3.5–5.1)
SODIUM: 137 mmol/L (ref 135–145)
Total Bilirubin: 0.5 mg/dL (ref 0.3–1.2)
Total Protein: 7.1 g/dL (ref 6.5–8.1)

## 2015-10-29 LAB — URINALYSIS, ROUTINE W REFLEX MICROSCOPIC
BILIRUBIN URINE: NEGATIVE
GLUCOSE, UA: NEGATIVE mg/dL
HGB URINE DIPSTICK: NEGATIVE
Ketones, ur: NEGATIVE mg/dL
Leukocytes, UA: NEGATIVE
Nitrite: NEGATIVE
PROTEIN: NEGATIVE mg/dL
Specific Gravity, Urine: 1.012 (ref 1.005–1.030)
pH: 5.5 (ref 5.0–8.0)

## 2015-10-29 LAB — I-STAT BETA HCG BLOOD, ED (MC, WL, AP ONLY)

## 2015-10-29 LAB — LIPASE, BLOOD: LIPASE: 22 U/L (ref 11–51)

## 2015-10-29 NOTE — ED Provider Notes (Signed)
[name]   CSN: [id number] Arrival date & time: 10/28/15  2256     History   Chief Complaint Chief Complaint  Patient presents with  . Migraine  . Abdominal Pain    HPI Jennifer Dickson is a 40 y.o. female with a hx of Non-insulin-dependent diabetes, migraine headache, diverticulosis, fibromyalgia, interstitial cystitis, cholecystectomy presents to the Emergency Department complaining of gradual, persistent, progressively worsening generalized and throbbing headache onset 3 days ago. Associated symptoms include photophobia and nausea.  Patient reports she has taken her pain medications at home without relief. Nothing makes it better and light makes it worse.  Patient reports she has previously been followed by neurology for her migraine headaches. She was also previously on Topamax but this did not help prevent her headaches.  Patient denies constant headache, headache worse in the morning or vision changes.  Additionally patient complains of right upper quadrant and suprapubic abdominal pain onset 2 days ago. She denies fever, chills, vomiting, weakness, dizziness, syncope. She reports it is not common for her to develop abdominal pain with her migraines. Pt reports the pain is rated at a 5/10 and described as sharp and stabbing. It does not radiate. Nothing makes it better or worse..     The history is provided by the patient and medical records. No language interpreter was used.  Abdominal Pain   Associated symptoms include nausea and headaches.    Past Medical History:  Diagnosis Date  . Diabetes mellitus without complication ([place])   . Diverticulosis   . Fibromyalgia   . IBS (irritable bowel syndrome)   . Interstitial cystitis   . Osteoarthritis     Patient Active Problem List   Diagnosis Date Noted  . Obesity, morbid, BMI 50 or higher ([place]) 06/16/2015  . Abdominal wall mass of suprapubic region 06/16/2015    Past Surgical History:    Procedure Laterality Date  . CHOLECYSTECTOMY      OB History    Gravida Para Term Preterm AB Living   5 4 4   1 4    SAB TAB Ectopic Multiple Live Births   1               Home Medications    Prior to Admission medications   Medication Sig Start Date End Date Taking? Authorizing Provider  amLODipine (NORVASC) 5 MG tablet Take 5 mg by mouth daily.   Yes Historical Provider, MD  DULoxetine (CYMBALTA) 30 MG capsule Take 30 mg by mouth at bedtime.   Yes Historical Provider, MD  esomeprazole (NEXIUM) 40 MG capsule Take 40 mg by mouth daily. 11/21/13  Yes Historical Provider, MD  gabapentin (NEURONTIN) 300 MG capsule Take 300 mg by mouth 2 (two) times daily.   Yes Historical Provider, MD  Hydrocodone-Acetaminophen 5-300 MG TABS Take 1 tablet by mouth every 8 (eight) hours.    Yes Historical Provider, MD  ibuprofen (ADVIL,MOTRIN) 200 MG tablet Take 200 mg by mouth every 6 (six) hours as needed for headache.   Yes Historical Provider, MD  promethazine (PHENERGAN) 25 MG tablet Take 25 mg by mouth every 6 (six) hours as needed for nausea or vomiting.   Yes Historical Provider, MD  traMADol (ULTRAM) 50 MG tablet Take 50 mg by mouth every 8 (eight) hours.    Yes Historical Provider, MD    Family History Family History  Problem Relation Age of Onset  . Diabetes Mother   . Cancer Mother   . Heart disease Mother   .  Heart disease Brother   . Cancer Sister     lung  . Cancer Sister     brain    Social History Social History  Substance Use Topics  . Smoking status: Former Smoker    Years: 5.00  . Smokeless tobacco: Not on file  . Alcohol use No     Allergies   Shellfish allergy; Augmentin [amoxicillin-pot clavulanate]; and Flexeril [cyclobenzaprine]   Review of Systems Review of Systems  Eyes: Positive for photophobia.  Gastrointestinal: Positive for abdominal pain and nausea.  Neurological: Positive for headaches.  All other systems reviewed and are  negative.    Physical Exam Updated Vital Signs BP 120/67   Pulse 84   Temp 98.7 F (37.1 C) (Oral)   Resp 18   Ht 5\' 6"  (1.676 m)   Wt (!) 154.2 kg   LMP 10/12/2015   SpO2 94%   BMI 54.88 kg/m   Physical Exam  Constitutional: She is oriented to person, place, and time. She appears well-developed and well-nourished. No distress.  Awake, alert, nontoxic appearance  HENT:  Head: Normocephalic and atraumatic.  Mouth/Throat: Oropharynx is clear and moist. No oropharyngeal exudate.  Eyes: Conjunctivae and EOM are normal. Pupils are equal, round, and reactive to light. No scleral icterus.  No horizontal, vertical or rotational nystagmus  Neck: Normal range of motion. Neck supple.  Full active and passive ROM without pain No midline or paraspinal tenderness No nuchal rigidity or meningeal signs  Cardiovascular: Normal rate, regular rhythm, normal heart sounds and intact distal pulses.   No murmur heard. Pulmonary/Chest: Effort normal and breath sounds normal. No respiratory distress. She has no wheezes. She has no rales.  Equal chest expansion  Abdominal: Soft. Bowel sounds are normal. She exhibits no mass. There is tenderness in the right upper quadrant and suprapubic area. There is no rigidity, no rebound, no guarding and no CVA tenderness.  Genitourinary: Uterus normal.  Musculoskeletal: Normal range of motion. She exhibits no edema.  Lymphadenopathy:    She has no cervical adenopathy.  Neurological: She is alert and oriented to person, place, and time. She has normal reflexes. No cranial nerve deficit. She exhibits normal muscle tone. Coordination normal.  Mental Status:  Alert, oriented, thought content appropriate. Speech fluent without evidence of aphasia. Able to follow 2 step commands without difficulty.  Cranial Nerves:  II:  Peripheral visual fields grossly normal, pupils equal, round, reactive to light III,IV, VI: ptosis not present, extra-ocular motions intact  bilaterally  V,VII: smile symmetric, facial light touch sensation equal VIII: hearing grossly normal bilaterally  IX,X: midline uvula rise  XI: bilateral shoulder shrug equal and strong XII: midline tongue extension  Motor:  5/5 in upper and lower extremities bilaterally including strong and equal grip strength and dorsiflexion/plantar flexion Sensory: Pinprick and light touch normal in all extremities.  Deep Tendon Reflexes: 2+ and symmetric  Cerebellar: normal finger-to-nose with bilateral upper extremities Gait: normal gait and balance CV: distal pulses palpable throughout   Skin: Skin is warm and dry. No rash noted. She is not diaphoretic. No erythema.  Psychiatric: She has a normal mood and affect. Her behavior is normal. Judgment and thought content normal.  Nursing note and vitals reviewed.    ED Treatments / Results  Labs (all labs ordered are listed, but only abnormal results are displayed) Labs Reviewed  CBC WITH DIFFERENTIAL/PLATELET - Abnormal; Notable for the following:       Result Value   WBC 11.0 (*)  Hemoglobin 11.4 (*)    MCH 25.0 (*)    RDW 17.4 (*)    Platelets 488 (*)    Neutro Abs 7.9 (*)    All other components within normal limits  COMPREHENSIVE METABOLIC PANEL - Abnormal; Notable for the following:    Potassium 3.4 (*)    Glucose, Bld 117 (*)    Albumin 3.3 (*)    All other components within normal limits  WET PREP, GENITAL  URINALYSIS, ROUTINE W REFLEX MICROSCOPIC (NOT AT [facility])  LIPASE, BLOOD  I-STAT BETA HCG BLOOD, ED ([facility], [facility], [facility] ONLY)  GC/CHLAMYDIA PROBE AMP ([place]) NOT AT [facility]    EKG  EKG Interpretation None       Radiology No results found.  Procedures Procedures (including critical care time)  Medications Ordered in ED Medications  sodium chloride 0.9 % bolus 1,000 mL (1,000 mLs Intravenous New Bag/Given 10/29/15 0048)  prochlorperazine (COMPAZINE) injection 10 mg (10 mg Intravenous Given 10/29/15 0048)   diphenhydrAMINE (BENADRYL) injection 12.5 mg (12.5 mg Intravenous Given 10/29/15 0048)     Initial Impression / Assessment and Plan / ED Course  I have reviewed the triage vital signs and the nursing notes.  Pertinent labs & imaging results that were available during my care of the patient were reviewed by me and considered in my medical decision making (see chart for details).  Clinical Course  Value Comment By Time  BP: 128/77 VSS, no tachycardia Abigail Butts, PA-C 10/19 2333  I-stat hCG, quantitative: <5.0 Negative Bennetta Rudden, PA-C 10/20 0100  Potassium: (!) 3.4 Mild hypokalemia Abigail Butts, PA-C 10/20 0146  WBC: (!) 11.0 Mild leukocytosis Abigail Butts, PA-C 10/20 0146  Hemoglobin: (!) 11.4 Mild anemia Kannon Baum, PA-C 10/20 0146  Leukocytes, UA: NEGATIVE No evidence of UTI Abigail Butts, PA-C 10/20 0146  Lipase: 22 Within normal limits [facility], PA-C 10/20 0146  AST: 15 No elevation in liver enzymes Abigail Butts, PA-C 10/20 0146   Pt reports her headache is much better.  She is now refusing her pelvic exam.  She reports she would rather her PCP perform this.  I expressed my concern about a potential abd infection including PID that could cause complication, sepsis, infertility or death if not diagnosed and treated. She states understanding and agrees to return if she changes her mind or symptoms worsen.   Jarrett Soho Nimrat Woolworth, PA-C 10/20 0205    Pt HA treated and improved while in ED.  Presentation is like pts typical HA and non concerning for [facility], ICH, Meningitis, or temporal arteritis. Pt is afebrile with no focal neuro deficits, nuchal rigidity, or change in vision. Pt is to follow up with PCP to discuss prophylactic medication. Pt verbalizes understanding and is agreeable with plan to dc.   Patient is nontoxic, nonseptic appearing, in no apparent distress.  Patient's pain and other symptoms improved in emergency  department.  Fluid bolus given.  Labs, imaging and vitals reviewed.  Patient does not meet the SIRS or Sepsis criteria.  On repeat exam patient does not have a surgical abdomin and there are no peritoneal signs.  No indication of appendicitis, bowel obstruction, bowel perforation, cholecystitis, diverticulitis, ectopic pregnancy.  Pt refused pelvic therefore unable to assess for PID. Patient discharged home with strict instructions for follow-up with their primary care physician.  I have also discussed reasons to return immediately to the ER.  We also discussed that she may return at ay time fore further evaluation of her abd pain including a  pelvic exam. Patient expresses understanding and agrees with plan.     Final Clinical Impressions(s) / ED Diagnoses   Final diagnoses:  Other migraine without status migrainosus, not intractable  Right upper quadrant abdominal pain  Lower abdominal pain    New Prescriptions New Prescriptions   No medications on file     Abigail Butts, PA-C 10/29/15 [place], DO 10/29/15 [phone]

## 2015-10-29 NOTE — ED Notes (Signed)
Pt refuses to have a pelvic exam completed by provider.

## 2015-10-29 NOTE — ED Notes (Signed)
Pt refused pelvic exam at this time. EDP aware.

## 2015-10-29 NOTE — Discharge Instructions (Signed)
1. Medications: usual home medications 2. Treatment: rest, drink plenty of fluids,  3. Follow Up: Please followup with your primary doctor in 2 days for discussion of your diagnoses and further evaluation after today's visit; if you do not have a primary care doctor use the resource guide provided to find one; Please return to the ER for persistent vomiting, high fevers or worsening symptoms

## 2015-11-24 ENCOUNTER — Encounter: Payer: Self-pay | Admitting: Vascular Surgery

## 2015-11-30 ENCOUNTER — Ambulatory Visit (HOSPITAL_COMMUNITY)
Admission: RE | Admit: 2015-11-30 | Discharge: 2015-11-30 | Disposition: A | Discharge status: Home / Self Care | Payer: Medicaid Other | Source: Ambulatory Visit | Attending: Vascular Surgery | Admitting: Vascular Surgery

## 2015-11-30 DIAGNOSIS — I83893 Varicose veins of bilateral lower extremities with other complications: Secondary | ICD-10-CM | POA: Insufficient documentation

## 2015-12-01 ENCOUNTER — Ambulatory Visit (INDEPENDENT_AMBULATORY_CARE_PROVIDER_SITE_OTHER): Payer: Medicaid Other | Admitting: Vascular Surgery

## 2015-12-01 ENCOUNTER — Encounter (HOSPITAL_COMMUNITY): Payer: Medicaid Other

## 2015-12-01 ENCOUNTER — Encounter: Payer: Self-pay | Admitting: Vascular Surgery

## 2015-12-01 VITALS — BP 131/90 | HR 85 | Temp 99.4°F | Resp 18 | Ht 66.0 in | Wt 351.3 lb

## 2015-12-01 DIAGNOSIS — I83813 Varicose veins of bilateral lower extremities with pain: Secondary | ICD-10-CM

## 2015-12-01 NOTE — Progress Notes (Signed)
Patient name: Jennifer Dickson MRN: [id number] DOB: 12-08-1975 Sex: female  REASON FOR CONSULT: Bilateral lower extremity varicose veins with pain and swelling. Referred by Gwenlyn Perking, NP.  HPI: Jennifer Dickson is a 40 y.o. female, who has had a long history of pain of both legs more significantly on the right side. This has been going on for least a year. She describes "tightness" in her legs and also swelling. She has noticed some varicose veins in the right leg but no significant varicose veins on the left. She denies any history of DVT or phlebitis.  She also has a history of fibromyalgia and I think some of her leg pain could certainly be related to this.  She does not work. She spends a lot of time at home sitting. She is not especially active because of her fibromyalgia and limitations from her weight.  Past Medical History:  Diagnosis Date  . Diabetes mellitus without complication ([place])   . Diverticulosis   . Fibromyalgia   . IBS (irritable bowel syndrome)   . Interstitial cystitis   . Osteoarthritis     Family History  Problem Relation Age of Onset  . Diabetes Mother   . Cancer Mother   . Heart disease Mother   . Heart disease Brother   . Cancer Sister     lung  . Cancer Sister     brain    SOCIAL HISTORY: Social History   Social History  . Marital status: Single    Spouse name: N/A  . Number of children: N/A  . Years of education: N/A   Occupational History  . Not on file.   Social History Main Topics  . Smoking status: Former Smoker    Years: 5.00  . Smokeless tobacco: Never Used  . Alcohol use No  . Drug use: No  . Sexual activity: No   Other Topics Concern  . Not on file   Social History Narrative  . No narrative on file    Allergies  Allergen Reactions  . Shellfish Allergy Anaphylaxis  . Augmentin [Amoxicillin-Pot Clavulanate] Rash  . Flexeril [Cyclobenzaprine] Rash    Current Outpatient Prescriptions  Medication Sig  Dispense Refill  . amLODipine (NORVASC) 5 MG tablet Take 5 mg by mouth daily.    . DULoxetine (CYMBALTA) 30 MG capsule Take 30 mg by mouth at bedtime.    Marland Kitchen esomeprazole (NEXIUM) 40 MG capsule Take 40 mg by mouth daily.    Marland Kitchen gabapentin (NEURONTIN) 300 MG capsule Take 300 mg by mouth 2 (two) times daily.    . Hydrocodone-Acetaminophen 5-300 MG TABS Take 1 tablet by mouth every 8 (eight) hours.     Marland Kitchen ibuprofen (ADVIL,MOTRIN) 200 MG tablet Take 200 mg by mouth every 6 (six) hours as needed for headache.    . promethazine (PHENERGAN) 25 MG tablet Take 25 mg by mouth every 6 (six) hours as needed for nausea or vomiting.    . traMADol (ULTRAM) 50 MG tablet Take 50 mg by mouth every 8 (eight) hours.      No current facility-administered medications for this visit.     REVIEW OF SYSTEMS:  [X]  denotes positive finding, [ ]  denotes negative finding Cardiac  Comments:  Chest pain or chest pressure: X   Shortness of breath upon exertion:    Short of breath when lying flat:    Irregular heart rhythm:        Vascular    Pain in calf, thigh, or  hip brought on by ambulation: X   Pain in feet at night that wakes you up from your sleep:  X   Blood clot in your veins:    Leg swelling:  X       Pulmonary    Oxygen at home:    Productive cough:     Wheezing:         Neurologic    Sudden weakness in arms or legs:     Sudden numbness in arms or legs:     Sudden onset of difficulty speaking or slurred speech:    Temporary loss of vision in one eye:     Problems with dizziness:  X       Gastrointestinal    Blood in stool:     Vomited blood:         Genitourinary    Burning when urinating:  X   Blood in urine:        Psychiatric    Major depression:         Hematologic    Bleeding problems:    Problems with blood clotting too easily:        Skin    Rashes or ulcers:        Constitutional    Fever or chills:      PHYSICAL EXAM: Vitals:   12/01/15 0937  BP: 131/90  Pulse: 85    Resp: 18  Temp: 99.4 F (37.4 C)  TempSrc: Oral  SpO2: 99%  Weight: (!) 351 lb 4.8 oz (159.3 kg)  Height: 5\' 6"  (1.676 m)    GENERAL: The patient is a well-nourished female, in no acute distress. The vital signs are documented above. CARDIAC: There is a regular rate and rhythm.  VASCULAR: I do not detect carotid bruits. She has palpable dorsalis pedis pulses bilaterally. She has mild bilateral lower extremity swelling. PULMONARY: There is good air exchange bilaterally without wheezing or rales. ABDOMEN: Soft and non-tender with normal pitched bowel sounds.  MUSCULOSKELETAL: There are no major deformities or cyanosis. NEUROLOGIC: No focal weakness or paresthesias are detected. SKIN: There are no ulcers or rashes noted. PSYCHIATRIC: The patient has a normal affect.  DATA:   BILATERAL LOWER EXTREMITY VENOUS DUPLEX: I have reviewed her bilateral lower extreme venous duplex study.  On the right side, there is no evidence of DVT or superficial thrombophlebitis. There is reflux in the deep system on the right involving the common femoral vein. There is also reflux in the saphenofemoral junction on the right. There is no reflux in the great saphenous vein or small saphenous vein.  On the left side, there is no evidence of DVT or superficial thrombophlebitis. She has deep vein reflux on the left involving the common femoral vein. There is also reflux in the left great saphenous vein.  MEDICAL ISSUES:  CHRONIC VENOUS INSUFFICIENCY: Based on her exam and duplex, she has evidence of bilateral lower extremity chronic venous insufficiency. I have discussed with her the importance of intermittent leg elevation in the proper positioning for this. I have also written her a prescription for knee-high compression stockings with a mild gradient. I have encouraged her to avoid prolonged sitting and standing and to exercise and walk as much as possible. We also discussed water aerobics which I think is  very helpful for people with chronic venous insufficiency. I'll be happy to see her in any point in the future if her symptoms progress. Currently I do not think she is a  good candidate for laser ablation of the saphenous vein on the left which does have some reflux in the proximal and mid segments.  Deitra Mayo [facility] [phone]

## 2015-12-07 ENCOUNTER — Encounter: Payer: Self-pay | Admitting: Vascular Surgery

## 2016-01-28 ENCOUNTER — Ambulatory Visit: Payer: Medicaid Other | Admitting: Neurology

## 2016-01-31 ENCOUNTER — Ambulatory Visit: Payer: Medicaid Other | Admitting: Obstetrics & Gynecology

## 2016-02-09 ENCOUNTER — Encounter: Payer: Self-pay | Admitting: Obstetrics and Gynecology

## 2016-02-09 ENCOUNTER — Ambulatory Visit (INDEPENDENT_AMBULATORY_CARE_PROVIDER_SITE_OTHER): Payer: Medicaid Other | Admitting: Obstetrics and Gynecology

## 2016-02-09 VITALS — BP 127/86 | HR 90 | Wt 353.0 lb

## 2016-02-09 DIAGNOSIS — N761 Subacute and chronic vaginitis: Secondary | ICD-10-CM | POA: Diagnosis not present

## 2016-02-09 MED ORDER — NORETHINDRONE 0.35 MG PO TABS: 1.0000 | ORAL_TABLET | Freq: Every day | ORAL | 11 refills | Status: DC

## 2016-02-09 NOTE — Progress Notes (Signed)
41 yo [id number] presenting today for the evaluation of recurrent vaginitis. Patient is currently without symptoms but has questions as to the etiology. Patient reports a 7-day period with passage of clots. She was last seen in the fall when medical management was discussed as well as endometrial ablation. Patient remains undecided on all options. She was not seen by general surgery regarding abdominal wall mass. Patient also reports a history of interstitial cystitis but has not seen a urologist in years  Past Medical History:  Diagnosis Date  . Diabetes mellitus without complication ([place])   . Diverticulosis   . Fibromyalgia   . IBS (irritable bowel syndrome)   . Interstitial cystitis   . Osteoarthritis    Past Surgical History:  Procedure Laterality Date  . CHOLECYSTECTOMY     Family History  Problem Relation Age of Onset  . Diabetes Mother   . Cancer Mother   . Heart disease Mother   . Heart disease Brother   . Cancer Sister     lung  . Cancer Sister     brain   Social History  Substance Use Topics  . Smoking status: Former Smoker    Years: 5.00  . Smokeless tobacco: Never Used  . Alcohol use No   ROS See pertinent in HPI  Blood pressure 127/86, pulse 90, weight (!) 353 lb (160.1 kg). GENERAL: Well-developed, well-nourished female in no acute distress. Obese NEURO: alert and oriented x 3  A/P 41 yo with recurrent vaginitis  - Discussed different environmental factors that may contribute (sweating, poor hygiene, laundry detergent, body soap, underwear brand) as well as her recent diagnosis of diabetes. - Reviewed medical management of heavy cycle and patient is interested in trying the pill- progesterone only option provided. Patient was interested in depo-provera but is aware of the weight gain associated with that method - Advised patient to follow up with PCP regarding abdominal wall mass as all the work up was done through her office for referrals. Along with referral  for IC follow up - RTC prn

## 2016-02-16 ENCOUNTER — Encounter: Payer: Self-pay | Admitting: Neurology

## 2016-02-16 ENCOUNTER — Ambulatory Visit (INDEPENDENT_AMBULATORY_CARE_PROVIDER_SITE_OTHER): Payer: Medicaid Other | Admitting: Neurology

## 2016-02-16 VITALS — BP 133/88 | HR 74 | Ht 66.0 in | Wt 354.4 lb

## 2016-02-16 DIAGNOSIS — R202 Paresthesia of skin: Secondary | ICD-10-CM

## 2016-02-16 DIAGNOSIS — R200 Anesthesia of skin: Secondary | ICD-10-CM

## 2016-02-16 DIAGNOSIS — H539 Unspecified visual disturbance: Secondary | ICD-10-CM

## 2016-02-16 DIAGNOSIS — G43709 Chronic migraine without aura, not intractable, without status migrainosus: Secondary | ICD-10-CM

## 2016-02-16 DIAGNOSIS — R531 Weakness: Secondary | ICD-10-CM | POA: Diagnosis not present

## 2016-02-16 DIAGNOSIS — R42 Dizziness and giddiness: Secondary | ICD-10-CM

## 2016-02-16 DIAGNOSIS — R52 Pain, unspecified: Secondary | ICD-10-CM

## 2016-02-16 DIAGNOSIS — M791 Myalgia, unspecified site: Secondary | ICD-10-CM

## 2016-02-16 DIAGNOSIS — M79601 Pain in right arm: Secondary | ICD-10-CM

## 2016-02-16 DIAGNOSIS — M542 Cervicalgia: Secondary | ICD-10-CM

## 2016-02-16 NOTE — Progress Notes (Signed)
[facility]    Provider:  Dr Jaynee Eagles Referring Provider: Barrie Lyme, FNP Primary Care Physician:  Barrie Lyme, FNP  CC:  Pain all over the body  HPI:  Jennifer Dickson is a 41 y.o. female here as a referral from Dr. Mel Almond for migaines and pain all over he body. PMHx migraines, fibromyalgia, interstitial cystitis, DM, morbid obesity, hypertension, depression, anxiety, cervical radiculopathy, tendinitis, osteoarthritis. She has pains, her hands shake all over due to the pain, and in the legs, her body jumps and she has to grab her right arm to stop it but it can happen on both side. She can see the muscle jump. And hands will shake. Mostly on the right side with pain into the hand and cramping or the right hand. She has neck pain in her shoulders and has swelling in her shoulders and tightness and pain in the cervical muscles and if she moves her arms her shoulder pops out of place. Pain shoots lal over in her back, her legs, her arms, "all over her body" she can't sit or stand too long. Started 3 years ago while working at [employer] and all of a sudden she started having pain all over her body, dizzy spells. She also has chronic headaches.She has severe headaches that shoots up from the back of the head and into the forehead she has been to the hospital, light and sound sensitivity, pounding on the right side and a lot of her pain is on the right side. She has weakness mostly on the right side and numbness on the right side and tremors on the right side. She used ot get shots in her neck for cervical radiculopathy. She also has neck pain and shoulders and back. She has tendinitis and osteoarthritis. She has vision changes and she loses her vision and can't see people. There is significant stress in her life. No other focal neurologic deficits, associated symptoms, inciting events or modifiable factors.  Reviewed notes, labs and imaging from outside physicians, which showed:    Reviewed primary care notes. Patient has fibromyalgia which is a chronic condition with many tender areas throughout the body. Symptoms worse upon waking. She follows with her primary care doctor for this. She has generalized intermittent pain all over. Topamax was restarted and there was no improvement with symptoms. She was started on Cymbalta and advised to follow up with psychiatry for counseling and evaluation. Topamax was stopped for possible side effects and she was given Phenergan for nausea. She was given gabapentin and referred to vascular studies for the lower legs.   She presented to the emergency room department complaining of gradual persistent progressive worsening generalized and throbbing headache onset. She described photophobia and nausea. Nothing made it better and light. It worse. She had been previously followed by neurology for her migraine headaches. She said that she was on Topamax in the past but this did not help. She also is complaining of right upper quadrant and suprapubic abdominal pain. No fevers chills vomiting weakness dizziness. Patient's medication list included Cymbalta and gabapentin. She is a former smoker. No alcohol use. Exam was normal including head ears ears neck cardiovascular abdominal musculoskeletal neurologic cranial nerves and motor cardiovascular skin. They noted that patient appeared in no apparent distress and appears nontoxic. Fluids were given. Patient was discharged with follow-up to primary care. Urinalysis was negative. CMP 10/28/2015 was unremarkable with BUN 9 and creatinine 0.74, CBC showed slightly elevated white blood cells 11 and slight  anemia with hemoglobin 11.4. Her medication list with the times that she was taking 50 mg by mouth daily if Topamax.  Review of Systems: Patient complains of symptoms per HPI as well as the following symptoms: Weight gain, blurred vision, loss of vision, easy bruising, easy bleeding, feeling hot, feeling cold,  joint pain, joint swelling, memory loss, confusion, headache, numbness, weakness, slurred speech, dizziness, sleepiness, snoring, restless legs, depression, anxiety, disinterest in activities. Pertinent negatives per HPI. All others negative.   Social History   Social History  . Marital status: Divorced    Spouse name: N/A  . Number of children: 4  . Years of education: Some college   Occupational History  . N/A    Social History Main Topics  . Smoking status: Former Smoker    Years: 5.00    Quit date: 2007  . Smokeless tobacco: Never Used  . Alcohol use No     Comment: None since 2013  . Drug use: No  . Sexual activity: No   Other Topics Concern  . Not on file   Social History Narrative   Lives at home w/ 3 of her children   Right-handed   Caffeine: 1-2 cups of coffee, 4 teas/sodas per day    Family History  Problem Relation Age of Onset  . Diabetes Mother   . Cancer Mother   . Heart disease Mother   . Heart disease Brother   . Cancer Sister     lung  . Cancer Sister     brain  . Migraines Neg Hx     Past Medical History:  Diagnosis Date  . Cervical radiculopathy   . Diabetes mellitus without complication ([place])   . Diverticulosis   . Endometrioma   . Fibromyalgia   . High blood pressure   . IBS (irritable bowel syndrome)   . Interstitial cystitis   . Osteoarthritis   . Tendonitis     Past Surgical History:  Procedure Laterality Date  . CHOLECYSTECTOMY  1996    Current Outpatient Prescriptions  Medication Sig Dispense Refill  . amLODipine (NORVASC) 5 MG tablet Take 5 mg by mouth daily.    Marland Kitchen esomeprazole (NEXIUM) 40 MG capsule Take 40 mg by mouth daily.    . Hydrocodone-Acetaminophen 5-300 MG TABS Take 1 tablet by mouth every 8 (eight) hours.     Marland Kitchen ibuprofen (ADVIL,MOTRIN) 200 MG tablet Take 200 mg by mouth every 6 (six) hours as needed for headache.    . metFORMIN (GLUCOPHAGE) 500 MG tablet Take by mouth.    . naproxen (NAPROSYN) 500 MG tablet  take 1 tablet BY MOUTH TWICE DAILY w/ food for 2 or 3 weeks then AS NEEDED  1  . norethindrone (MICRONOR,CAMILA,ERRIN) 0.35 MG tablet Take 1 tablet (0.35 mg total) by mouth daily. 1 Package 11  . promethazine (PHENERGAN) 25 MG tablet Take 25 mg by mouth every 6 (six) hours as needed for nausea or vomiting.    . traMADol (ULTRAM) 50 MG tablet Take 50 mg by mouth every 8 (eight) hours.     . DULoxetine (CYMBALTA) 30 MG capsule Take 30 mg by mouth at bedtime.    . gabapentin (NEURONTIN) 300 MG capsule Take 300 mg by mouth 2 (two) times daily.     No current facility-administered medications for this visit.     Allergies as of 02/16/2016 - Review Complete 02/16/2016  Allergen Reaction Noted  . Shellfish allergy Anaphylaxis 10/09/2014  . Augmentin [amoxicillin-pot clavulanate] Rash 10/09/2014  .  Flexeril [cyclobenzaprine] Rash 10/09/2014    Vitals: BP 133/88   Pulse 74   Ht 5\' 6"  (1.676 m)   Wt (!) 354 lb 6.4 oz (160.8 kg)   BMI 57.20 kg/m  Last Weight:  Wt Readings from Last 1 Encounters:  02/16/16 (!) 354 lb 6.4 oz (160.8 kg)   Last Height:   Ht Readings from Last 1 Encounters:  02/16/16 5\' 6"  (1.676 m)    Physical exam: Exam: Gen: anxious, teary, conversant,morbidly  Obese, but in no apparent distress or pain                   CV: RRR, no MRG. No Carotid Bruits. No peripheral edema, warm, nontender Eyes: Conjunctivae clear without exudates or hemorrhage  Neuro: Detailed Neurologic Exam  Speech:    Speech is normal; fluent and spontaneous with normal comprehension.  Cognition:    The patient is oriented to person, place, and time;     recent and remote memory intact;     language fluent;     normal attention, concentration,     fund of knowledge Cranial Nerves:    The pupils are equal, round, and reactive to light. The fundi are normal and spontaneous venous pulsations are present. Visual fields are full to finger confrontation. Extraocular movements are intact.  Trigeminal sensation is intact and the muscles of mastication are normal. The face is symmetric. The palate elevates in the midline. Hearing intact. Voice is normal. Shoulder shrug is normal. The tongue has normal motion without fasciculations.   Coordination:    Normal finger to nose and heel to shin. Normal rapid alternating movements.   Gait:    Wide based (likely due to extremely large body habitus) and antalgic  Motor Observation:    No asymmetry, no atrophy, and no involuntary movements noted. Tone:    Normal muscle tone.    Posture:    Posture is normal. normal erect    Strength: Poor effort and giveway throughout appears normal         Sensation: intact to LT     Reflex Exam:  DTR's:    Deep tendon reflexes in the upper and lower extremities are normal bilaterally.   Toes:    The toes are downgoing bilaterally.   Clonus:    Clonus is absent.      Assessment/Plan:  41 year old with multiple neurologic complaints, migraines and "pain all over my body". PMHx migraines, fibromyalgia, interstitial cystitis, DM, morbid obesity, hypertension, depression, anxiety, cervical radiculopathy, tendinitis, osteoarthritis. Neurologic exam is non focal.   - Emg/ncs of the bilateral upper extremities due to her right hand numbness and arm pain to eval for CTS vs Radiculopathy - MRI of the brain w/wo contrast due to headaches, right-sided symptoms, vision changes, dizziness to eval for intracranial causes such as stroke given her vascular risk factors or MS given her multiple neurologic complaints - Musculoskeletal neck pain and radicular symptoms: Physical Therapy - Morbid obesity: Recommended weight loss  Discussed: To prevent or relieve headaches, try the following: Cool Compress. Lie down and place a cool compress on your head.  Avoid headache triggers. If certain foods or odors seem to have triggered your migraines in the past, avoid them. A headache diary might help you identify  triggers.  Include physical activity in your daily routine. Try a daily walk or other moderate aerobic exercise.  Manage stress. Find healthy ways to cope with the stressors, such as delegating tasks on your to-do  list.  Practice relaxation techniques. Try deep breathing, yoga, massage and visualization.  Eat regularly. Eating regularly scheduled meals and maintaining a healthy diet might help prevent headaches. Also, drink plenty of fluids.  Follow a regular sleep schedule. Sleep deprivation might contribute to headaches Consider biofeedback. With this mind-body technique, you learn to control certain bodily functions - such as muscle tension, heart rate and blood pressure - to prevent headaches or reduce headache pain.    Proceed to emergency room if you experience new or worsening symptoms or symptoms do not resolve, if you have new neurologic symptoms or if headache is severe, or for any concerning symptom.   Cc: Dr. Janann August, MD  [facility] [address] [place], [place] [address]  Phone [phone] Fax [phone]

## 2016-02-16 NOTE — Patient Instructions (Signed)
Remember to drink plenty of fluid, eat healthy meals and do not skip any meals. Try to eat protein with a every meal and eat a healthy snack such as fruit or nuts in between meals. Try to keep a regular sleep-wake schedule and try to exercise daily, particularly in the form of walking, 20-30 minutes a day, if you can.   As far as your medications are concerned, I would like to suggest: We will consider a re-trial of Topiramate (Topamax) after workup complete for migraines  As far as diagnostic testing: MRI brain, emg/ncs and Physical Therapy  I would like to see you back for emg/ncs then in 3 months, sooner if we need to. Please call us with any interim questions, concerns, problems, updates or refill requests.   Our phone number is [phone]. We also have an after hours call service for urgent matters and there is a physician on-call for urgent questions. For any emergencies you know to call 911 or go to the nearest emergency room

## 2016-02-17 ENCOUNTER — Telehealth: Payer: Self-pay

## 2016-02-17 ENCOUNTER — Other Ambulatory Visit: Payer: Self-pay | Admitting: Sports Medicine

## 2016-02-17 DIAGNOSIS — M25511 Pain in right shoulder: Secondary | ICD-10-CM

## 2016-02-17 LAB — BASIC METABOLIC PANEL
BUN / CREAT RATIO: 16 (ref 9–23)
BUN: 12 mg/dL (ref 6–24)
CO2: 23 mmol/L (ref 18–29)
Calcium: 9.6 mg/dL (ref 8.7–10.2)
Chloride: 101 mmol/L (ref 96–106)
Creatinine, Ser: 0.77 mg/dL (ref 0.57–1.00)
GFR, EST AFRICAN AMERICAN: 110 mL/min/{1.73_m2} (ref >59)
GFR, EST NON AFRICAN AMERICAN: 96 mL/min/{1.73_m2} (ref >59)
Glucose: 96 mg/dL (ref 65–99)
POTASSIUM: 4 mmol/L (ref 3.5–5.2)
SODIUM: 142 mmol/L (ref 134–144)

## 2016-02-17 NOTE — Telephone Encounter (Signed)
-----   Message from Melvenia Beam, MD sent at 02/17/2016  9:50 AM EST ----- Labs normal thanks

## 2016-02-17 NOTE — Telephone Encounter (Signed)
Called pt w/ unremarkable lab results. Asks if she could start medication now since her kidney function came back OK.

## 2016-02-20 NOTE — Telephone Encounter (Signed)
I do want to retry Topamax it looks like she was only on 50mg  a day and often we need to go higher than this. I will start her at 50mg  at bedtime and in 1 week she can increase to 100mg  at bedtime. It takes 4-6 weeks for medications to start working and at that time we can further increase as needed. Please discuss with patient thanks

## 2016-02-21 MED ORDER — TOPIRAMATE 50 MG PO TABS: ORAL_TABLET | ORAL | 0 refills | Status: DC

## 2016-02-21 NOTE — Telephone Encounter (Signed)
Called pt with instructions on re-starting Topamax. Rx e-scribed to pharmacy as verified w/ pt. Let her know that dosage could be increase even further as needed. Verbalized understanding and appreciation for call.

## 2016-02-21 NOTE — Addendum Note (Signed)
Addended by: Monte Fantasia on: 02/21/2016 05:35 PM   Modules accepted: Orders

## 2016-02-25 ENCOUNTER — Ambulatory Visit: Payer: Medicaid Other | Attending: Neurology | Admitting: Rehabilitation

## 2016-02-25 ENCOUNTER — Encounter: Payer: Self-pay | Admitting: Rehabilitation

## 2016-02-25 DIAGNOSIS — G8929 Other chronic pain: Secondary | ICD-10-CM | POA: Insufficient documentation

## 2016-02-25 DIAGNOSIS — M6281 Muscle weakness (generalized): Secondary | ICD-10-CM | POA: Insufficient documentation

## 2016-02-25 DIAGNOSIS — R2681 Unsteadiness on feet: Secondary | ICD-10-CM | POA: Diagnosis present

## 2016-02-25 DIAGNOSIS — M542 Cervicalgia: Secondary | ICD-10-CM | POA: Diagnosis present

## 2016-02-25 DIAGNOSIS — M545 Low back pain: Secondary | ICD-10-CM | POA: Insufficient documentation

## 2016-02-25 NOTE — Patient Instructions (Signed)
Thoracic Self-Mobilization (Supine)    With rolled towel placed lengthwise at lower ribs level (do this as able-or if your bed is squishy), lie back on towel with arms outstretched. Hold __1-2__ seconds. Relax. Repeat _2-3___ times per set. Do __2__ sets per session. Do ___2-3_ sessions per day.  http://orth.exer.us/1001   Copyright  VHI. All rights reserved.

## 2016-02-25 NOTE — Therapy (Signed)
[facility] [address] [place], [place], [address] Phone: [phone]   Fax:  [phone]  Physical Therapy Evaluation  Patient Details  Name: Jennifer Dickson MRN: [id number] Date of Birth: [birth year] Referring Provider: Sarina Ill, MD  Encounter Date: 02/25/2016      PT End of Session - 02/25/16 1546    Visit Number 1   Number of Visits 4   Date for PT Re-Evaluation 04/25/16   Authorization Type MCD (awaiting approval)   PT Start Time 0930   PT Stop Time 1016   PT Time Calculation (min) 46 min   Activity Tolerance Patient limited by pain   Behavior During Therapy [facility] for tasks assessed/performed      Past Medical History:  Diagnosis Date  . Cervical radiculopathy   . Diabetes mellitus without complication ([place])   . Diverticulosis   . Endometrioma   . Fibromyalgia   . High blood pressure   . IBS (irritable bowel syndrome)   . Interstitial cystitis   . Osteoarthritis   . Tendonitis     Past Surgical History:  Procedure Laterality Date  . CHOLECYSTECTOMY  1996    There were no vitals filed for this visit.       Subjective Assessment - 02/25/16 0936    Subjective "I was over at the neurologist and I was telling them about the pain in my shoulder and they think its coming from my neck."     Limitations House hold activities;Walking;Standing   How long can you stand comfortably? 5-10 mins   How long can you walk comfortably? 5-10 mins (if that)    Patient Stated Goals "reduce this pain.  I want to be able to move."    Currently in Pain? Yes   Pain Score 10-Worst pain ever   Pain Location Generalized  Most in R shoulder   Pain Orientation Right   Pain Descriptors / Indicators Aching;Shooting;Sharp   Pain Type Chronic pain   Pain Onset More than a month ago   Pain Frequency Intermittent   Aggravating Factors  moving but sometimes laying also   Pain Relieving Factors rest, heating blanket              [facility] PT Assessment - 02/25/16 0941      Assessment   Medical Diagnosis Cervical radiculopathy, back pain, leg pain, shoulder pain   Referring Provider Sarina Ill, MD   Onset Date/Surgical Date --  In the past year pain has gotten worse   Hand Dominance Right     Precautions   Precautions Fall   Precaution Comments Note she is going to GI surgeon next week to scan for abdominal cancer     Restrictions   Weight Bearing Restrictions No     Balance Screen   Has the patient fallen in the past 6 months Yes   How many times? 1   Has the patient had a decrease in activity level because of a fear of falling?  No  more related to pain rather than fall risk   Is the patient reluctant to leave their home because of a fear of falling?  No     Home Environment   Living Environment Private residence   Living Arrangements Children  two 10 y/os and 49 y/o   Available Help at Discharge --  just kids   Type of [place] to enter   Entrance Stairs-Number of Steps 4  Entrance Stairs-Rails Right;Left;Can reach both   Home Layout One level   [employer] - single point;Tub bench     Prior Function   Level of Independence Independent   Vocation Unemployed   Leisure Likes to bake and write poems (daily devotional)     Cognition   Overall Cognitive Status Impaired/Different from baseline   Area of Impairment Attention;Memory   Attention Comments States she has a hard time processing or getting point across to others   Memory Decreased short-term memory     Sensation   Light Touch Impaired Detail   Light Touch Impaired Details Impaired RLE  increased sensitiivty, duller light touch   Hot/Cold Appears Intact     Coordination   Gross Motor Movements are Fluid and Coordinated No   Fine Motor Movements are Fluid and Coordinated No   Coordination and Movement Description Pt limited due to pain, weakness and body habitus      Posture/Postural Control   Posture/Postural Control Postural limitations   Postural Limitations Forward head;Rounded Shoulders;Flexed trunk;Posterior pelvic tilt   Posture Comments Note marked tenderness and upper trap/paraspinal tightness on R<L      ROM / Strength   AROM / PROM / Strength AROM;Strength     AROM   Overall AROM  Deficits   AROM Assessment Site Cervical   Cervical Flexion 22 deg   Cervical Extension 30 deg  with increased pain   Cervical - Right Rotation 20 deg   Cervical - Left Rotation 30 deg     Strength   Overall Strength Deficits   Overall Strength Comments RLE with overall 3/5 strength, unable to provide resistance due to increased pain.  LLE grossly 4/5     Flexibility   Soft Tissue Assessment /Muscle Length no     Ambulation/Gait   Ambulation/Gait Yes   Ambulation/Gait Assistance 5: Supervision   Ambulation/Gait Assistance Details Pt with very antalgic gait pattern due to back pain, leg pain and tendonitis in R ankle, therefore highly recommend she use cane for safety and also recommend she wear tennis shoes for improved support and stability     Ambulation Distance (Feet) 55 Feet   Assistive device None                           PT Education - 02/25/16 1546    Education provided Yes   Education Details evaluation findings, POC, possible need for dry needling-transferring to [employer].    Person(s) Educated Patient   Methods Explanation   Comprehension Verbalized understanding          PT Short Term Goals - 02/25/16 1554      PT SHORT TERM GOAL #1   Title Pt will report compliance with initial HEP to indicate improved functional mobility and decreased pain.  (target: following first visit after eval)   Baseline dependent      PT SHORT TERM GOAL #2   Title Will assess/perform dry needling if able to upper cervical musculature to assist with tightness and decrease pain.     Baseline Have sent for follow up appts at [address] [facility]  for assessment for DN.            PT Long Term Goals - 02/25/16 1555      PT LONG TERM GOAL #1   Title Pt will be independent with final HEP in order to indicate improved functional mobility and decreased pain.  (target: following third visit  after eval)   Baseline dependent      PT LONG TERM GOAL #2   Title Pt will report no more than 7/10 pain with functional mobility in order to indicate pain decreased limiting factor.     Baseline 10/10 pain at all times     PT LONG TERM GOAL #3   Title Pt will improve cervical ROM by 10 deg in all tested ranges in order to indicate improved ability to return to ADLs.     Baseline 22 deg cerv flex, 30 deg cerv ext, 30 deg L rotation, 20 deg R rotation     PT LONG TERM GOAL #4   Title Pt will demonstrate safe use of SPC during gait in order to improve balance and decrease fall risk.     Baseline not using cane, increased fall risk due to antalgic gait pattern.                Plan - 02/25/16 1548    Clinical Impression Statement Pt presents with cervical radiculopathy (Cervical root disorder, not elsewhere specified G54.2), decreased cervical and shoulder muscle flexibility with increased pain to these areas, decreased cervical ROM, low back pain and decreased balance due to pain issues.  Note history of DM, fibromyalgia, and obesity that could impact progress.  Upon PT evaluation, note severely decreased ROM in neck causing pt not to be able to drive,  pain with all ROM, increased muscle tightness/trigger points in upper cervical/paraspinal region and limited shoulder ROM due to pain.  Pt is of evolving presentation and moderate complexity per PT POC standpoing.  Pt will benefit from skilled OP PT in order to address deficits.  This PT to transfer care to ortho clinic for possible dry needling.        Rehab Potential Fair   Clinical Impairments Affecting Rehab Potential co morbidities and visit limitations due to MCD   PT Frequency Other  (comment)  1 visit every 2 weeks for 3 visits total   PT Duration Other (comment)  1 visit every 2 weeks for 3 visit total   PT Treatment/Interventions ADLs/Self Care Home Management;Electrical Stimulation;Moist Heat;Traction;Ultrasound;DME Instruction;Gait training;Functional mobility training;Therapeutic activities;Therapeutic exercise;Balance training;Neuromuscular re-education;Patient/family education;Manual techniques;Passive range of motion;Dry needling;Taping   PT Next Visit Plan (OPPT-please add goals as you see appropriate!) assess for appropriateness for dry needling, see if she went to GI MD regarding abdominal scan (possible stomach cancer??), give HEP for cervical ROM/strengthening, anterior chest stretch, gait with SPC   PT Home Exercise Plan Provided initial HEP for limited cervical ROM/stretching and modified self thoracic mobiliation for ant chest stretch   Consulted and Agree with Plan of Care Patient      Patient will benefit from skilled therapeutic intervention in order to improve the following deficits and impairments:  Abnormal gait, Decreased activity tolerance, Decreased balance, Decreased knowledge of use of DME, Decreased mobility, Decreased range of motion, Decreased safety awareness, Decreased strength, Difficulty walking, Impaired perceived functional ability, Impaired flexibility, Impaired sensation, Impaired UE functional use, Improper body mechanics, Postural dysfunction, Pain  Visit Diagnosis: Cervicalgia - Plan: PT plan of care cert/re-cert  Muscle weakness (generalized) - Plan: PT plan of care cert/re-cert  Chronic bilateral low back pain, with sciatica presence unspecified - Plan: PT plan of care cert/re-cert  Unsteadiness on feet - Plan: PT plan of care cert/re-cert     Problem List Patient Active Problem List   Diagnosis Date Noted  . Obesity, morbid, BMI 50 or higher ([place]) 06/16/2015  .  Abdominal wall mass of suprapubic region 06/16/2015     Cameron Sprang, PT, MPT [facility] [address] [place], [place], [address] Phone: [phone]   Fax:  [phone] 02/25/16, 4:03 PM  Name: Jennifer Dickson MRN: [id number] Date of Birth: 06-24-1975

## 2016-03-01 ENCOUNTER — Other Ambulatory Visit: Payer: Medicaid Other

## 2016-03-07 ENCOUNTER — Inpatient Hospital Stay
Admission: RE | Admit: 2016-03-07 | Discharge: 2016-03-07 | Disposition: A | Discharge status: Home / Self Care | Payer: Medicaid Other | Source: Ambulatory Visit | Attending: Sports Medicine | Admitting: Sports Medicine

## 2016-03-07 ENCOUNTER — Inpatient Hospital Stay: Admission: RE | Admit: 2016-03-07 | Payer: Medicaid Other | Source: Ambulatory Visit

## 2016-03-08 ENCOUNTER — Telehealth: Payer: Self-pay | Admitting: Physical Therapy

## 2016-03-08 ENCOUNTER — Ambulatory Visit: Payer: Medicaid Other | Admitting: Physical Therapy

## 2016-03-08 NOTE — Telephone Encounter (Signed)
LVM about missing her appointment today at 11:45. Reminded pt of when her next scheduled appointment is and if she feels she cannot make it that she can call and cancel.

## 2016-03-15 ENCOUNTER — Telehealth: Payer: Self-pay | Admitting: Physical Therapy

## 2016-03-15 ENCOUNTER — Ambulatory Visit: Payer: Medicaid Other | Attending: Neurology | Admitting: Physical Therapy

## 2016-03-15 DIAGNOSIS — M6281 Muscle weakness (generalized): Secondary | ICD-10-CM | POA: Insufficient documentation

## 2016-03-15 DIAGNOSIS — R2681 Unsteadiness on feet: Secondary | ICD-10-CM | POA: Insufficient documentation

## 2016-03-15 DIAGNOSIS — G8929 Other chronic pain: Secondary | ICD-10-CM | POA: Insufficient documentation

## 2016-03-15 DIAGNOSIS — M545 Low back pain: Secondary | ICD-10-CM | POA: Insufficient documentation

## 2016-03-15 DIAGNOSIS — M542 Cervicalgia: Secondary | ICD-10-CM | POA: Insufficient documentation

## 2016-03-15 NOTE — Telephone Encounter (Signed)
Spoke with pt regarding missing her last 2 scheduled appointments. She stated she didn't even know she had appointments. Discussed missed appointment policy and that we will cancel all visits but the next one; Also that 3 missed appointments is grounds for discharge. Reminded pt of her next appointment schedule, and if she can't make it then she can cancel. Provided her with the clinic address and phone number.

## 2016-03-16 ENCOUNTER — Telehealth: Payer: Self-pay | Admitting: *Deleted

## 2016-03-16 NOTE — Telephone Encounter (Signed)
Pt called in complaining of pain in the back of her right calf that she has had for about a week. She feels a hardness back there and sees some swelling at her ankle, though she reports swelling does happen in her ankle from time to time. Her foot has no swelling. The area feels warm. Reviewed her study from her visit 11/2015. No reflux in the RSSV. Told the patient to try Ibuprofen 600mg  po with food 3x's a day, heat and elevation. Long discussion regarding this probably being acute thrombophlebitis, DVT's, coughing up blood, chest pain. Pt expressed understanding. She will try the measures suggested and call me next week with an update. Follow prn.

## 2016-03-17 ENCOUNTER — Other Ambulatory Visit: Payer: Self-pay | Admitting: Neurology

## 2016-03-22 ENCOUNTER — Encounter: Payer: Self-pay | Admitting: Physical Therapy

## 2016-03-22 ENCOUNTER — Ambulatory Visit: Payer: Medicaid Other | Admitting: Physical Therapy

## 2016-03-22 DIAGNOSIS — M542 Cervicalgia: Secondary | ICD-10-CM | POA: Diagnosis present

## 2016-03-22 DIAGNOSIS — R2681 Unsteadiness on feet: Secondary | ICD-10-CM

## 2016-03-22 DIAGNOSIS — M545 Low back pain: Secondary | ICD-10-CM

## 2016-03-22 DIAGNOSIS — M6281 Muscle weakness (generalized): Secondary | ICD-10-CM | POA: Diagnosis present

## 2016-03-22 DIAGNOSIS — G8929 Other chronic pain: Secondary | ICD-10-CM | POA: Diagnosis present

## 2016-03-22 NOTE — Therapy (Addendum)
[place], [place], [address] Phone: [phone]   Fax:  [phone]  Physical Therapy Treatment / discharge summary  Patient Details  Name: Jennifer Dickson MRN: [id number] Date of Birth: [birth year] Referring Provider: Sarina Ill, MD  Encounter Date: 03/22/2016      PT End of Session - 03/22/16 1247    Visit Number 2   Number of Visits 4   Date for PT Re-Evaluation 04/25/16   PT Start Time 1448   PT Stop Time 1242   PT Time Calculation (min) 55 min   Activity Tolerance Patient tolerated treatment well   Behavior During Therapy [facility] for tasks assessed/performed      Past Medical History:  Diagnosis Date  . Cervical radiculopathy   . Diabetes mellitus without complication ([place])   . Diverticulosis   . Endometrioma   . Fibromyalgia   . High blood pressure   . IBS (irritable bowel syndrome)   . Interstitial cystitis   . Osteoarthritis   . Tendonitis     Past Surgical History:  Procedure Laterality Date  . CHOLECYSTECTOMY  1996    There were no vitals filed for this visit.      Subjective Assessment - 03/22/16 1147    Subjective I am feeling like im in alot of pain, numbness/ tingling in the R.  currently reports Head, double vision, but denies tinnitus. My MD reports it may be due to the fibromyalgia, but I think it goes deeper then and was diagnosed with cervical radiculopathy"    Currently in Pain? Yes   Pain Score 8    Pain Location Neck   Pain Orientation Right   Pain Descriptors / Indicators Sharp   Pain Type Chronic pain   Pain Onset More than a month ago   Pain Frequency Intermittent   Aggravating Factors  looking to the R, lifting the R arm.    Pain Relieving Factors medication, ice/ heat but doesn't help            [facility] PT Assessment - 03/22/16 0001      ROM / Strength   AROM / PROM / Strength AROM     AROM   AROM Assessment Site Shoulder   Right/Left  Shoulder Left;Right   Right Shoulder Extension 35 Degrees  ERP   Right Shoulder Flexion 80 Degrees  end range pain with popping   Right Shoulder ABduction 80 Degrees  end range pain   Right Shoulder Internal Rotation --  to stomach   Right Shoulder External Rotation 37 Degrees   Left Shoulder Extension 60 Degrees   Left Shoulder Flexion 125 Degrees   Left Shoulder ABduction 95 Degrees   Left Shoulder Internal Rotation --  stomach   Left Shoulder External Rotation 46 Degrees     Palpation   Palpation comment significant tightness in bil upper trap/ levator scapule. tightness in sub-occipitals                     [facility] Adult PT Treatment/Exercise - 03/22/16 0001      Neck Exercises: Supine   Neck Retraction 10 reps;3 secs     Modalities   Modalities Moist Heat     Moist Heat Therapy   Number Minutes Moist Heat 10 Minutes   Moist Heat Location Cervical     Manual Therapy   Manual Therapy Soft tissue mobilization   Manual therapy comments sub-occiptial release  provided tennis balls for sub-occiptal release  at home   Soft tissue mobilization DTM over the R upper trap     Neck Exercises: Stretches   Upper Trapezius Stretch 30 seconds;2 reps          Trigger Point Dry Needling - 03/22/16 1242    Consent Given? Yes   Education Handout Provided Yes   Muscles Treated Upper Body Upper trapezius   Upper Trapezius Response Twitch reponse elicited;Palpable increased muscle length              PT Education - 03/22/16 1248    Education provided Yes   Education Details updated HEP with proper form/ rationale, benefits of sub-occipital release and how ot perform at home. Anatomy regarding trigger point formation/ referral of pain. What TPDN is, benefits, what to expect and after care.    Person(s) Educated Patient   Methods Explanation;Verbal cues;Handout   Comprehension Verbalized understanding;Verbal cues required          PT Short Term Goals -  03/22/16 1252      PT SHORT TERM GOAL #1   Title Pt will report compliance with initial HEP to indicate improved functional mobility and decreased pain.  (target: following first visit after eval)   Baseline Not consistent with HEP   Time 2   Period Weeks   Status On-going     PT SHORT TERM GOAL #2   Title Will assess/perform dry needling if able to upper cervical musculature to assist with tightness and decrease pain.     Time 2   Period Weeks   Status Partially Met           PT Long Term Goals - 03/22/16 1253      PT LONG TERM GOAL #1   Title Pt will be independent with final HEP in order to indicate improved functional mobility and decreased pain.  (target: following third visit after eval)   Baseline Not consistent   Time 2   Period Weeks   Status New     PT LONG TERM GOAL #2   Title Pt will report no more than 7/10 pain with functional mobility in order to indicate pain decreased limiting factor.     Baseline 7/10 pain today   Period Weeks   Status On-going     PT LONG TERM GOAL #3   Title Pt will demonstrate reduced upper trap tightness to improve cervical ROM by 10 deg in all tested ranges in order to indicate improved ability to return to ADLs.     Baseline 22 deg cerv flex, 30 deg cerv ext, 30 deg L rotation, 20 deg R rotation   Time 2   Period Weeks   Status Revised     PT LONG TERM GOAL #4   Title Pt will demonstrate safe use of SPC during gait in order to improve balance and decrease fall risk.     Baseline continues to not using cane, increased fall risk due to antalgic gait pattern.    Time 2   Period Weeks   Status On-going               Plan - 03/22/16 1249    Clinical Impression Statement Mrs. Lepp reports no improvement in pain. edcuated and performed TPDN over the R upper trap, soft tissue massage was performed following TPDN. Following DN and sub-occipital release she reported reduced headache. utilized MHP post sessiont to calm down  soreness from DN and decrease tightness.    PT Frequency 1x / week  PT Duration 2 weeks   PT Next Visit Plan assess response to DN, thoracic mobility cervical mobility, posture, manaul PRN,    PT Home Exercise Plan Provided initial HEP for limited cervical ROM/stretching and modified self thoracic mobiliation for ant chest stretch, seated thoracic extension, chin tuck, scapular retraction, upper trap stretch   Consulted and Agree with Plan of Care Patient      Patient will benefit from skilled therapeutic intervention in order to improve the following deficits and impairments:  Abnormal gait, Decreased activity tolerance, Decreased balance, Decreased knowledge of use of DME, Decreased mobility, Decreased range of motion, Decreased safety awareness, Decreased strength, Difficulty walking, Impaired perceived functional ability, Impaired flexibility, Impaired sensation, Impaired UE functional use, Improper body mechanics, Postural dysfunction, Pain  Visit Diagnosis: Cervicalgia  Muscle weakness (generalized)  Chronic bilateral low back pain, with sciatica presence unspecified  Unsteadiness on feet     Problem List Patient Active Problem List   Diagnosis Date Noted  . Obesity, morbid, BMI 50 or higher ([place]) 06/16/2015  . Abdominal wall mass of suprapubic region 06/16/2015   Starr Lake PT, DPT, LAT, ATC  03/22/16  12:56 PM      [facility] [address] [place], [place], [address] Phone: [phone]   Fax:  [phone]  Name: Alisan Dokes MRN: [id number] Date of Birth: 09/12/1975      PHYSICAL THERAPY DISCHARGE SUMMARY  Visits from Start of Care: 2  Current functional level related to goals / functional outcomes: See goals   Remaining deficits: Unknown    Education / Equipment: HEP  Plan: Patient agrees to discharge.  Patient goals were not met. Patient is being discharged due to not returning  since the last visit.  ?????     Hamdan Toscano PT, DPT, LAT, ATC  04/11/16  8:23 AM

## 2016-03-23 ENCOUNTER — Other Ambulatory Visit: Payer: Medicaid Other

## 2016-03-23 ENCOUNTER — Inpatient Hospital Stay
Admission: RE | Admit: 2016-03-23 | Discharge: 2016-03-23 | Disposition: A | Discharge status: Home / Self Care | Payer: Medicaid Other | Source: Ambulatory Visit | Attending: Sports Medicine | Admitting: Sports Medicine

## 2016-03-29 ENCOUNTER — Ambulatory Visit: Payer: Medicaid Other | Admitting: Physical Therapy

## 2016-04-04 ENCOUNTER — Ambulatory Visit
Admission: RE | Admit: 2016-04-04 | Discharge: 2016-04-04 | Disposition: A | Discharge status: Home / Self Care | Payer: Medicaid Other | Source: Ambulatory Visit | Attending: Neurology | Admitting: Neurology

## 2016-04-04 DIAGNOSIS — H539 Unspecified visual disturbance: Secondary | ICD-10-CM

## 2016-04-04 DIAGNOSIS — R200 Anesthesia of skin: Secondary | ICD-10-CM

## 2016-04-04 DIAGNOSIS — M791 Myalgia, unspecified site: Secondary | ICD-10-CM

## 2016-04-04 DIAGNOSIS — G43709 Chronic migraine without aura, not intractable, without status migrainosus: Secondary | ICD-10-CM

## 2016-04-04 DIAGNOSIS — R531 Weakness: Secondary | ICD-10-CM

## 2016-04-04 DIAGNOSIS — R42 Dizziness and giddiness: Secondary | ICD-10-CM

## 2016-04-04 MED ORDER — GADOBENATE DIMEGLUMINE 529 MG/ML IV SOLN
20.0000 mL | Freq: Once | INTRAVENOUS | Status: AC | PRN
  Administered 2016-04-04: 20 mL via INTRAVENOUS

## 2016-04-05 ENCOUNTER — Ambulatory Visit: Payer: Medicaid Other | Admitting: Physical Therapy

## 2016-04-06 ENCOUNTER — Telehealth: Payer: Self-pay

## 2016-04-06 ENCOUNTER — Encounter: Payer: Medicaid Other | Admitting: Neurology

## 2016-04-06 NOTE — Telephone Encounter (Signed)
Pt no-showed EMG/NCV today.

## 2016-04-07 ENCOUNTER — Encounter: Payer: Self-pay | Admitting: Neurology

## 2016-04-10 ENCOUNTER — Encounter: Payer: Self-pay | Admitting: Neurology

## 2016-04-10 ENCOUNTER — Telehealth: Payer: Self-pay | Admitting: *Deleted

## 2016-04-10 DIAGNOSIS — I776 Arteritis, unspecified: Secondary | ICD-10-CM

## 2016-04-10 NOTE — Telephone Encounter (Signed)
-----   Message from Melvenia Beam, MD sent at 04/07/2016 12:19 PM EDT ----- MRI of the brain was unremarkable. She has some white matter changes which are commonly seen on MRIs and can be due to normal again as well as migraines. She is coming in for emg in may so I can show it to her then. In the meantime Id like to order an MRA of the head just to take a look at her blood vessels (to rule out vasculitis) but I think these findings are not worrisome, see them in most people. If she agrees please order MRA head wo contrast for evaluation of vasculitis.

## 2016-04-10 NOTE — Telephone Encounter (Signed)
See result note/fim 

## 2016-04-10 NOTE — Telephone Encounter (Signed)
Pt went on mychart and saw test results but still has questions, is asking for a call back for a better understanding

## 2016-04-10 NOTE — Telephone Encounter (Signed)
I have spoken with Jennifer Dickson this morning and per AA, reviewed MRI results as below.  She verbalized understanding of same, is agreeable with MRA  head.  Order placed in EPIC/fim

## 2016-04-12 ENCOUNTER — Other Ambulatory Visit: Payer: Self-pay | Admitting: Neurology

## 2016-04-13 ENCOUNTER — Ambulatory Visit
Admission: RE | Admit: 2016-04-13 | Discharge: 2016-04-13 | Disposition: A | Discharge status: Home / Self Care | Payer: Medicaid Other | Source: Ambulatory Visit | Attending: Sports Medicine | Admitting: Sports Medicine

## 2016-04-13 ENCOUNTER — Telehealth: Payer: Self-pay | Admitting: Neurology

## 2016-04-13 DIAGNOSIS — M25511 Pain in right shoulder: Secondary | ICD-10-CM

## 2016-04-13 MED ORDER — IOPAMIDOL (ISOVUE-M 200) INJECTION 41%
15.0000 mL | Freq: Once | INTRAMUSCULAR | Status: AC
  Administered 2016-04-13: 15 mL via INTRA_ARTICULAR

## 2016-04-13 NOTE — Telephone Encounter (Signed)
I called peer to peer line and provided additional info.  MRA head without contrast approved (CPT code [id number]), PA# [id number], for dates 04-12-16 thru 05-12-16/fim

## 2016-04-13 NOTE — Telephone Encounter (Signed)
This is a Dr. Jaynee Eagles patient.. Medicaid did not approve the MRA Head needing more clinical information. The case number is [id number] and the phone number for the peer to peer is [phone].Marland KitchenShe is scheduled to have this exam done on Monday 04/17/16 at [facility]. Thank you for your help!

## 2016-04-14 ENCOUNTER — Ambulatory Visit
Admission: RE | Admit: 2016-04-14 | Discharge: 2016-04-14 | Disposition: A | Discharge status: Home / Self Care | Payer: Medicaid Other | Source: Ambulatory Visit | Attending: Neurology | Admitting: Neurology

## 2016-04-14 DIAGNOSIS — I776 Arteritis, unspecified: Secondary | ICD-10-CM

## 2016-04-14 NOTE — Telephone Encounter (Signed)
Noted, thank you Faith.  °

## 2016-04-15 ENCOUNTER — Other Ambulatory Visit: Payer: Medicaid Other

## 2016-04-17 ENCOUNTER — Other Ambulatory Visit: Payer: Medicaid Other

## 2016-04-17 ENCOUNTER — Telehealth: Payer: Self-pay

## 2016-04-17 ENCOUNTER — Other Ambulatory Visit: Payer: Self-pay | Admitting: Neurology

## 2016-04-17 MED ORDER — SUMATRIPTAN SUCCINATE 100 MG PO TABS: 100.0000 mg | ORAL_TABLET | Freq: Once | ORAL | 12 refills | Status: DC | PRN

## 2016-04-17 NOTE — Telephone Encounter (Signed)
-----   Message from Melvenia Beam, MD sent at 04/17/2016  9:31 AM EDT ----- MRI is normal thanks

## 2016-04-17 NOTE — Telephone Encounter (Signed)
Called pt w/ normal MRA. Questions answered re: "small outpouching from the right supraclinoid ICA on reformats is an infundibulum based on source images." Let pt know that despite "outpouching" MRA was negative w/ no vasculitis, aneurysm or stenosis. Verbalized understanding and appreciation for call.  Reports that since she started topiramate, her HAs have gotten worse. However, she has only been taking 1 tab (50 mg). Encouraged her to increase to 100 mg at bedtime for prevention. Would like to consider rescue med for migraines while waiting for Topamax to become therapeutic.

## 2016-04-17 NOTE — Telephone Encounter (Signed)
Patient calling to discuss MRI results. She has questions.

## 2016-04-17 NOTE — Telephone Encounter (Signed)
Yes we can try Imitrex for acute management of her severe headaches. Please take one tablet at the onset of your headache. If it does not improve the symptoms please take one additional tablet in 2 hours. Do not take more then 2 tablets in 24hrs. Do not use more then 2 to 3 times in a week. The most common side-effects are feeling sick (nausea), dizziness and dry mouth. In addition, triptans can also cause some people to experience strange sensations. These may be a tightness, tingling, flushing, and feelings of heaviness or pressure in areas such as the face and limbs, and occasionally the chest. Serious side effects can include stroke, cardiac side effects such as chest tightness, shortness of breath and possible cardiovascular adverse effects.

## 2016-04-19 NOTE — Telephone Encounter (Signed)
Called pt to notify of new rescue med (sent to pharmacy) and instructions. May call back w/ questions/concerns.

## 2016-04-22 ENCOUNTER — Other Ambulatory Visit: Payer: Medicaid Other

## 2016-05-18 ENCOUNTER — Encounter: Payer: Medicaid Other | Admitting: Neurology

## 2016-05-18 ENCOUNTER — Ambulatory Visit: Payer: Medicaid Other | Admitting: Nurse Practitioner

## 2016-05-25 ENCOUNTER — Ambulatory Visit: Payer: Medicaid Other | Admitting: Nurse Practitioner

## 2016-06-06 ENCOUNTER — Telehealth: Payer: Self-pay

## 2016-06-06 MED ORDER — TOPIRAMATE 100 MG PO TABS: 100.0000 mg | ORAL_TABLET | Freq: Every day | ORAL | 1 refills | Status: DC

## 2016-06-06 NOTE — Telephone Encounter (Signed)
Pt called this afternoon w/ c/o severe HA and episodes of "her one side going out." She was seen today by her PCP for c/o R hip pain/popping. Says that she could not tolerate Imitrex d/t side effects. Reports taking only 50 mg of topiramate at bedtime. Advised her again to increase dose to 100 mg (2 tabs) daily. New rx for 100 mg tabs also e-scribed to pharmacy. She was seen as a new pt on 02/16/16 for migraines and numbness, then no showed NCV/EMG in March, cancelled NCV/EMG and f/u appt w/ Hoyle Sauer NP earlier this month.  States that she only has insurance until 06/08/16. Currently, there are no open appts this week on either Dr. Jaynee Eagles or Carolyn's schedule.

## 2016-06-07 MED ORDER — RIZATRIPTAN BENZOATE 10 MG PO TBDP: 10.0000 mg | ORAL_TABLET | ORAL | 11 refills | Status: DC | PRN

## 2016-06-07 NOTE — Addendum Note (Signed)
Addended by: Monte Fantasia on: 06/07/2016 10:04 AM   Modules accepted: Orders

## 2016-06-07 NOTE — Telephone Encounter (Signed)
I agree, increase Topiramate. Can prescribe rizatriptan and see if that works better than imitrex thanks

## 2016-06-07 NOTE — Telephone Encounter (Signed)
Rx for rizatriptan e-scribed to pt's pharmacy.

## 2016-07-06 ENCOUNTER — Telehealth: Payer: Self-pay | Admitting: Neurology

## 2016-07-06 ENCOUNTER — Other Ambulatory Visit: Payer: Self-pay | Admitting: Neurology

## 2016-07-06 MED ORDER — RIZATRIPTAN BENZOATE 10 MG PO TABS: 10.0000 mg | ORAL_TABLET | ORAL | 11 refills | Status: DC | PRN

## 2016-07-06 NOTE — Addendum Note (Signed)
Addended by: Monte Fantasia on: 07/06/2016 05:05 PM   Modules accepted: Orders

## 2016-07-06 NOTE — Telephone Encounter (Signed)
Pt's insurance does not cover disintegrating tabs. New rx sent in for regular tabs.

## 2016-07-06 NOTE — Telephone Encounter (Signed)
Pharmacist Rachel Bo requesting prior auth for rizatriptan (MAXALT-MLT) 10 MG disintegrating tablet

## 2016-07-11 ENCOUNTER — Telehealth: Payer: Self-pay

## 2016-07-11 NOTE — Telephone Encounter (Signed)
Called [employer] to initiate PAs. However, pt only has family planning coverage for meds such as birth control and anti-infectives. Pt will need to call Medicaid case worker to change plan or pay for medications out of pocket.

## 2016-09-03 ENCOUNTER — Encounter (HOSPITAL_COMMUNITY): Payer: Self-pay | Admitting: Emergency Medicine

## 2016-09-03 ENCOUNTER — Emergency Department (HOSPITAL_COMMUNITY): Payer: Medicaid Other

## 2016-09-03 ENCOUNTER — Emergency Department (HOSPITAL_COMMUNITY)
Admission: EM | Admit: 2016-09-03 | Discharge: 2016-09-03 | Disposition: A | Discharge status: Home / Self Care | Payer: Medicaid Other | Attending: Emergency Medicine | Admitting: Emergency Medicine

## 2016-09-03 DIAGNOSIS — Z87891 Personal history of nicotine dependence: Secondary | ICD-10-CM | POA: Insufficient documentation

## 2016-09-03 DIAGNOSIS — R1084 Generalized abdominal pain: Secondary | ICD-10-CM | POA: Diagnosis not present

## 2016-09-03 DIAGNOSIS — E119 Type 2 diabetes mellitus without complications: Secondary | ICD-10-CM | POA: Insufficient documentation

## 2016-09-03 DIAGNOSIS — Z79899 Other long term (current) drug therapy: Secondary | ICD-10-CM | POA: Diagnosis not present

## 2016-09-03 DIAGNOSIS — Z7984 Long term (current) use of oral hypoglycemic drugs: Secondary | ICD-10-CM | POA: Insufficient documentation

## 2016-09-03 LAB — COMPREHENSIVE METABOLIC PANEL
ALBUMIN: 3.8 g/dL (ref 3.5–5.0)
ALT: 21 U/L (ref 14–54)
ANION GAP: 8 (ref 5–15)
AST: 22 U/L (ref 15–41)
Alkaline Phosphatase: 93 U/L (ref 38–126)
BILIRUBIN TOTAL: 0.4 mg/dL (ref 0.3–1.2)
BUN: 12 mg/dL (ref 6–20)
CHLORIDE: 103 mmol/L (ref 101–111)
CO2: 26 mmol/L (ref 22–32)
Calcium: 9.1 mg/dL (ref 8.9–10.3)
Creatinine, Ser: 0.61 mg/dL (ref 0.44–1.00)
GFR calc Af Amer: >60 mL/min (ref >60)
GFR calc non Af Amer: >60 mL/min (ref >60)
GLUCOSE: 101 mg/dL — AB (ref 65–99)
POTASSIUM: 3.6 mmol/L (ref 3.5–5.1)
SODIUM: 137 mmol/L (ref 135–145)
Total Protein: 7.9 g/dL (ref 6.5–8.1)

## 2016-09-03 LAB — CBC WITH DIFFERENTIAL/PLATELET
BASOS PCT: 0 %
Basophils Absolute: 0.1 10*3/uL (ref 0.0–0.1)
Eosinophils Absolute: 0.1 10*3/uL (ref 0.0–0.7)
Eosinophils Relative: 1 %
HEMATOCRIT: 39.4 % (ref 36.0–46.0)
HEMOGLOBIN: 12.4 g/dL (ref 12.0–15.0)
LYMPHS ABS: 3.9 10*3/uL (ref 0.7–4.0)
LYMPHS PCT: 28 %
MCH: 25.4 pg — ABNORMAL LOW (ref 26.0–34.0)
MCHC: 31.5 g/dL (ref 30.0–36.0)
MCV: 80.6 fL (ref 78.0–100.0)
MONO ABS: 0.7 10*3/uL (ref 0.1–1.0)
MONOS PCT: 5 %
NEUTROS ABS: 9.2 10*3/uL — AB (ref 1.7–7.7)
NEUTROS PCT: 66 %
Platelets: 515 10*3/uL — ABNORMAL HIGH (ref 150–400)
RBC: 4.89 MIL/uL (ref 3.87–5.11)
RDW: 16.8 % — AB (ref 11.5–15.5)
WBC: 13.8 10*3/uL — ABNORMAL HIGH (ref 4.0–10.5)

## 2016-09-03 LAB — URINALYSIS, ROUTINE W REFLEX MICROSCOPIC
BILIRUBIN URINE: NEGATIVE
Glucose, UA: NEGATIVE mg/dL
Hgb urine dipstick: NEGATIVE
KETONES UR: NEGATIVE mg/dL
LEUKOCYTES UA: NEGATIVE
NITRITE: NEGATIVE
PH: 7.5 (ref 5.0–8.0)
Protein, ur: NEGATIVE mg/dL
Specific Gravity, Urine: 1.01 (ref 1.005–1.030)

## 2016-09-03 LAB — LIPASE, BLOOD: Lipase: 29 U/L (ref 11–51)

## 2016-09-03 LAB — I-STAT BETA HCG BLOOD, ED (MC, WL, AP ONLY): I-stat hCG, quantitative: <5 m[IU]/mL (ref <5)

## 2016-09-03 MED ORDER — PROMETHAZINE HCL 25 MG PO TABS: 25.0000 mg | ORAL_TABLET | Freq: Four times a day (QID) | ORAL | 0 refills | Status: DC | PRN

## 2016-09-03 MED ORDER — FAMOTIDINE IN NACL 20-0.9 MG/50ML-% IV SOLN
20.0000 mg | Freq: Once | INTRAVENOUS | Status: AC
  Administered 2016-09-03: 20 mg via INTRAVENOUS
  Filled 2016-09-03: qty 50

## 2016-09-03 MED ORDER — DICYCLOMINE HCL 20 MG PO TABS: 20.0000 mg | ORAL_TABLET | Freq: Two times a day (BID) | ORAL | 0 refills | Status: DC

## 2016-09-03 MED ORDER — GI COCKTAIL ~~LOC~~
30.0000 mL | Freq: Once | ORAL | Status: AC
  Administered 2016-09-03: 30 mL via ORAL
  Filled 2016-09-03: qty 30

## 2016-09-03 MED ORDER — PANTOPRAZOLE SODIUM 40 MG IV SOLR
40.0000 mg | Freq: Once | INTRAVENOUS | Status: AC
  Administered 2016-09-03: 40 mg via INTRAVENOUS
  Filled 2016-09-03: qty 40

## 2016-09-03 MED ORDER — ONDANSETRON HCL 4 MG/2ML IJ SOLN
4.0000 mg | Freq: Once | INTRAMUSCULAR | Status: AC
  Administered 2016-09-03: 4 mg via INTRAVENOUS
  Filled 2016-09-03: qty 2

## 2016-09-03 MED ORDER — IOPAMIDOL (ISOVUE-300) INJECTION 61%
INTRAVENOUS | Status: AC
  Administered 2016-09-03: 100 mL via INTRAVENOUS
  Filled 2016-09-03: qty 100

## 2016-09-03 MED ORDER — MORPHINE SULFATE (PF) 4 MG/ML IV SOLN
4.0000 mg | Freq: Once | INTRAVENOUS | Status: AC
  Administered 2016-09-03: 4 mg via INTRAVENOUS
  Filled 2016-09-03: qty 1

## 2016-09-03 MED ORDER — SODIUM CHLORIDE 0.9 % IV BOLUS (SEPSIS)
1000.0000 mL | Freq: Once | INTRAVENOUS | Status: AC
  Administered 2016-09-03: 1000 mL via INTRAVENOUS

## 2016-09-03 MED ORDER — IOPAMIDOL (ISOVUE-300) INJECTION 61%
INTRAVENOUS | Status: AC
  Filled 2016-09-03: qty 100

## 2016-09-03 NOTE — ED Notes (Signed)
Patient tolerating PO fluids 

## 2016-09-03 NOTE — ED Notes (Signed)
Patient given water

## 2016-09-03 NOTE — ED Notes (Signed)
Writer states they went into patients room to speak with patient and the visitor at bed side began asking questions about why Probation officer had radio on and why Probation officer had an earpiece in the radio, and "what is the Probation officer listening to?". Writer explained it was how the staff communicated with the rest of the staff in the ED and the earpiece was to keep things confidential. Visitor began explaining HIPAA laws to Probation officer. Writer listened to what visitor had to say and began speaking with patient after the visitor finished speaking.

## 2016-09-03 NOTE — Discharge Instructions (Signed)
There were no significant or acute abnormalities on the lab work or imaging. Please follow-up with your primary care provider as soon as possible on this matter for any further management. Return to the ED as needed.  May take the Bentyl for abdominal pain or discomfort. Phenergan for nausea.

## 2016-09-03 NOTE — ED Notes (Signed)
Patient transported to CT 

## 2016-09-03 NOTE — ED Notes (Signed)
Patient stated she could not void at this time but would give urine specimen when able.

## 2016-09-03 NOTE — ED Triage Notes (Signed)
Patient here from home via EMS with complaints of upper abdominal pain x3 days. Nausea/vomiting. Hx of IBS and diverticulitis. Ambulatory.

## 2016-09-03 NOTE — ED Provider Notes (Signed)
[facility] DEPT Provider Note   CSN: [id number] Arrival date & time: 09/03/16  1329     History   Chief Complaint Chief Complaint  Patient presents with  . Abdominal Pain    HPI Jennifer Dickson is a 41 y.o. female.  HPI   Jennifer Dickson is a 41 y.o. female, with a history of fibromyalgia, endometrioma, interstitial cystitis, and DM, presenting to the ED with abdominal pain beginning 2-3 days ago. Pain waxes and wanes, sharp, initially only RLQ, but now also periumbilical and radiating to the epigastrium, rated 10/10. Also endorses nausea, vomiting, and constipation. She started taking a stool softener yesterday. Last BM was this morning, but was minimal and hard.   She has had a "knot" in the region for almost ten years, but it has been causing monthly pain for the past year. She states she can not remember the cause of the problem, but has been there since she gave birth to her last child. Is being followed by [facility] in [place] for the mass in the RLQ. She states she was told she would need surgery, but they wanted to put her on hormonal therapy first. She has been on OCP for almost a year.   LMP "about 2 weeks ago."   Denies fever, hematemesis, hematochezia/melena, abnormal vaginal discharge/bleeding, urinary complaints, back pain, or any other complaints.     Past Medical History:  Diagnosis Date  . Cervical radiculopathy   . Diabetes mellitus without complication ([place])   . Diverticulosis   . Endometrioma   . Fibromyalgia   . High blood pressure   . IBS (irritable bowel syndrome)   . Interstitial cystitis   . Osteoarthritis   . Tendonitis     Patient Active Problem List   Diagnosis Date Noted  . Obesity, morbid, BMI 50 or higher ([place]) 06/16/2015  . Abdominal wall mass of suprapubic region 06/16/2015    Past Surgical History:  Procedure Laterality Date  . CHOLECYSTECTOMY  1996    OB History    Gravida Para Term Preterm AB Living    5 4 4   1 4    SAB TAB Ectopic Multiple Live Births   1               Home Medications    Prior to Admission medications   Medication Sig Start Date End Date Taking? Authorizing Provider  amLODipine (NORVASC) 5 MG tablet Take 5 mg by mouth daily.   Yes [provider]  DULoxetine (CYMBALTA) 30 MG capsule Take 30 mg by mouth at bedtime.   Yes [provider]  esomeprazole (NEXIUM) 40 MG capsule Take 40 mg by mouth daily. 11/21/13  Yes [provider]  gabapentin (NEURONTIN) 300 MG capsule Take 300 mg by mouth 2 (two) times daily.   Yes [provider]  Hydrocodone-Acetaminophen 5-300 MG TABS Take 1 tablet by mouth every 8 (eight) hours.    Yes [provider]  ibuprofen (ADVIL,MOTRIN) 200 MG tablet Take 200 mg by mouth every 6 (six) hours as needed for headache.   Yes [provider]  metFORMIN (GLUCOPHAGE) 500 MG tablet Take by mouth. 01/19/16  Yes [provider]  naproxen (NAPROSYN) 500 MG tablet take 1 tablet BY MOUTH TWICE DAILY w/ food for 2 or 3 weeks then AS NEEDED 02/14/16  Yes [provider]  norethindrone (MICRONOR,CAMILA,ERRIN) 0.35 MG tablet Take 1 tablet (0.35 mg total) by mouth daily. 02/09/16  Yes Constant, Vickii Chafe, MD  promethazine (PHENERGAN) 25 MG tablet Take 25 mg by mouth every 6 (six) hours as needed for nausea or vomiting.   Yes [provider]  rizatriptan (MAXALT) 10 MG tablet Take 1 tablet (10 mg total) by mouth as needed for migraine. May repeat in 2 hours if needed 07/06/16  Yes Melvenia Beam, MD  topiramate (TOPAMAX) 100 MG tablet Take 1 tablet (100 mg total) by mouth daily. 06/06/16  Yes Melvenia Beam, MD  traMADol (ULTRAM) 50 MG tablet Take 50 mg by mouth every 8 (eight) hours.    Yes [provider]  dicyclomine (BENTYL) 20 MG tablet Take 1 tablet (20 mg total) by mouth 2 (two) times daily. 09/03/16   Joy, Shawn C, PA-C  promethazine (PHENERGAN) 25 MG tablet Take 1 tablet  (25 mg total) by mouth every 6 (six) hours as needed for nausea or vomiting. 09/03/16   Joy, Helane Gunther, PA-C    Family History Family History  Problem Relation Age of Onset  . Diabetes Mother   . Cancer Mother   . Heart disease Mother   . Heart disease Brother   . Cancer Sister        lung  . Cancer Sister        brain  . Migraines Neg Hx     Social History Social History  Substance Use Topics  . Smoking status: Former Smoker    Years: 5.00    Quit date: 2007  . Smokeless tobacco: Never Used  . Alcohol use No     Comment: None since 2013     Allergies   Shellfish allergy; Augmentin [amoxicillin-pot clavulanate]; and Flexeril [cyclobenzaprine]   Review of Systems Review of Systems  Constitutional: Negative for chills, diaphoresis and fever.  Respiratory: Negative for cough and shortness of breath.   Cardiovascular: Negative for chest pain.  Gastrointestinal: Positive for abdominal pain, constipation, nausea and vomiting. Negative for blood in stool.  Genitourinary: Negative for difficulty urinating, dysuria, hematuria, vaginal bleeding and vaginal discharge.  Musculoskeletal: Negative for back pain.  All other systems reviewed and are negative.    Physical Exam Updated Vital Signs BP 132/83 (BP Location: Left Arm)   Pulse 86   Temp 98.2 F (36.8 C) (Oral)   Resp 18   SpO2 95%   Physical Exam  Constitutional: She appears well-developed and well-nourished. No distress.  Morbidly obese black female  HENT:  Head: Normocephalic and atraumatic.  Eyes: Conjunctivae are normal.  Neck: Neck supple.  Cardiovascular: Normal rate, regular rhythm, normal heart sounds and intact distal pulses.   Pulmonary/Chest: Effort normal and breath sounds normal. No respiratory distress.  Abdominal: Soft. Bowel sounds are normal. There is tenderness in the right lower quadrant, epigastric area and periumbilical area. There is no guarding and no CVA tenderness.  Musculoskeletal: She  exhibits no edema.  Lymphadenopathy:    She has no cervical adenopathy.  Neurological: She is alert.  Skin: Skin is warm and dry. She is not diaphoretic.  Psychiatric: She has a normal mood and affect. Her behavior is normal.  Nursing note and vitals reviewed.    ED Treatments / Results  Labs (all labs ordered are listed, but only abnormal results are displayed) Labs Reviewed  COMPREHENSIVE METABOLIC PANEL - Abnormal; Notable for the following:       Result Value   Glucose, Bld 101 (*)    All other components within normal limits  CBC WITH DIFFERENTIAL/PLATELET - Abnormal; Notable for the following:  WBC 13.8 (*)    MCH 25.4 (*)    RDW 16.8 (*)    Platelets 515 (*)    Neutro Abs 9.2 (*)    All other components within normal limits  URINALYSIS, ROUTINE W REFLEX MICROSCOPIC  LIPASE, BLOOD  I-STAT BETA HCG BLOOD, ED ([facility], [facility], [facility] ONLY)    EKG  EKG Interpretation None       Radiology Ct Abdomen Pelvis W Contrast  Result Date: 09/03/2016 CLINICAL DATA:  Upper abdominal pain x3 days EXAM: CT ABDOMEN AND PELVIS WITH CONTRAST TECHNIQUE: Multidetector CT imaging of the abdomen and pelvis was performed using the standard protocol following bolus administration of intravenous contrast. CONTRAST:  100 mL Isovue 300 IV COMPARISON:  None. FINDINGS: Lower chest: Lung bases are clear. Hepatobiliary: Liver is within normal limits. Status post cholecystectomy. Pancreas: Within normal limits. Spleen: Within normal limits. Adrenals/Urinary Tract: Adrenal glands are within normal limits. Malrotated right kidney. Left kidney is within normal limits. No renal calculi or hydronephrosis. Bladder is within normal limits. Stomach/Bowel: Stomach is within normal limits. No evidence of bowel obstruction. Normal appendix (series 2/image 59). Vascular/Lymphatic: No evidence of abdominal aortic aneurysm. No suspicious abdominopelvic lymphadenopathy. Reproductive: Heterogeneous appearance of the uterus with  is suspected dominant 3.8 cm subserosal right fundal fibroid (series 2/image 68). Bilateral ovaries are within normal limits. Other: No abdominopelvic ascites. Musculoskeletal: 2.4 x 2.8 x 2.9 cm soft tissue lesion along the right lower anterior abdominal wall (series 2/image 69). When correlating with prior [facility] CT pelvis report dated 04/28/2015 (using [place]), this previously measured 3.1 x 2.5 cm, grossly unchanged. Visualized osseous structures are within normal limits. IMPRESSION: No CT findings to account for the patient's upper abdominal pain. 2.9 cm soft tissue lesion along the right lower abdominal wall, likely reflecting scar endometriosis versus an abdominal wall desmoid, unchanged since 2017 by report. Electronically Signed   By: Julian Hy M.D.   On: 09/03/2016 18:50    Procedures Procedures (including critical care time)  Medications Ordered in ED Medications  morphine 4 MG/ML injection 4 mg (4 mg Intravenous Given 09/03/16 1711)  ondansetron (ZOFRAN) injection 4 mg (4 mg Intravenous Given 09/03/16 1706)  sodium chloride 0.9 % bolus 1,000 mL (0 mLs Intravenous Stopped 09/03/16 2010)  iopamidol (ISOVUE-300) 61 % injection (100 mLs Intravenous Contrast Given 09/03/16 1823)  pantoprazole (PROTONIX) injection 40 mg (40 mg Intravenous Given 09/03/16 1851)  famotidine (PEPCID) IVPB 20 mg premix (0 mg Intravenous Stopped 09/03/16 1943)  gi cocktail (Maalox,Lidocaine,Donnatal) (30 mLs Oral Given 09/03/16 1956)  morphine 4 MG/ML injection 4 mg (4 mg Intravenous Given 09/03/16 1956)     Initial Impression / Assessment and Plan / ED Course  I have reviewed the triage vital signs and the nursing notes.  Pertinent labs & imaging results that were available during my care of the patient were reviewed by me and considered in my medical decision making (see chart for details).  Clinical Course as of Sep 05 10  Sun Sep 03, 2016  1807 Patient states her GERD symptoms are starting  to "act up." Requests protonix, pepcid, and GI cocktail.  [SJ]    Clinical Course User Index [SJ] Joy, Shawn C, PA-C    Patient presents with abdominal pain. Patient is nontoxic appearing, afebrile, not tachycardic, not tachypneic, not hypotensive, and maintains excellent SPO2 on room air. Patient has no signs of sepsis or other serious or life-threatening condition. Lab results reassuring. No acute abnormality on CT. Able to pass  PO challenge. PCP follow-up. The patient was given instructions for home care as well as return precautions. Patient voices understanding of these instructions, accepts the plan, and is comfortable with discharge.  Pain and exam is not suggestive of AAA. LE pulses equal bilaterally. She has no abdominal aortic abnormalities on previous abdominal CT in 2016. No hypertension here in the ED. Non-smoker.   Chart review reveals patient was diagnosed with an endometrioma in Feb 2018. Is being evaluated for it by [facility]. Her medical management appears to have been done by Gwenlyn Perking, [place].    Findings and plan of care discussed with Tanna Furry, MD.   Vitals:   09/03/16 1341 09/03/16 1515 09/03/16 1710 09/03/16 1912  BP: 132/83 119/69 134/80 117/70  Pulse: 86 75 74 77  Resp: 18 16 18 18   Temp: 98.2 F (36.8 C)     TempSrc: Oral     SpO2: 95% 97% 100% 100%     Final Clinical Impressions(s) / ED Diagnoses   Final diagnoses:  Generalized abdominal pain    New Prescriptions Discharge Medication List as of 09/03/2016  7:50 PM    START taking these medications   Details  dicyclomine (BENTYL) 20 MG tablet Take 1 tablet (20 mg total) by mouth 2 (two) times daily., Starting Sun 09/03/2016, Print    !! promethazine (PHENERGAN) 25 MG tablet Take 1 tablet (25 mg total) by mouth every 6 (six) hours as needed for nausea or vomiting., Starting Sun 09/03/2016, Print     !! - Potential duplicate medications found.  Please discuss with provider.       Lorayne Bender, PA-C 09/04/16 0012    Tanna Furry, MD 09/12/16 1450

## 2016-09-04 ENCOUNTER — Encounter (HOSPITAL_COMMUNITY): Payer: Self-pay | Admitting: Emergency Medicine

## 2016-09-04 ENCOUNTER — Emergency Department (HOSPITAL_COMMUNITY)
Admission: EM | Admit: 2016-09-04 | Discharge: 2016-09-05 | Disposition: A | Discharge status: Home / Self Care | Payer: Medicaid Other | Attending: Emergency Medicine | Admitting: Emergency Medicine

## 2016-09-04 ENCOUNTER — Emergency Department (HOSPITAL_COMMUNITY): Payer: Medicaid Other

## 2016-09-04 DIAGNOSIS — Z87891 Personal history of nicotine dependence: Secondary | ICD-10-CM | POA: Diagnosis not present

## 2016-09-04 DIAGNOSIS — E119 Type 2 diabetes mellitus without complications: Secondary | ICD-10-CM | POA: Insufficient documentation

## 2016-09-04 DIAGNOSIS — Z79899 Other long term (current) drug therapy: Secondary | ICD-10-CM | POA: Diagnosis not present

## 2016-09-04 DIAGNOSIS — R072 Precordial pain: Secondary | ICD-10-CM | POA: Insufficient documentation

## 2016-09-04 DIAGNOSIS — R0602 Shortness of breath: Secondary | ICD-10-CM | POA: Diagnosis not present

## 2016-09-04 DIAGNOSIS — R079 Chest pain, unspecified: Secondary | ICD-10-CM | POA: Diagnosis present

## 2016-09-04 LAB — BASIC METABOLIC PANEL
ANION GAP: 9 (ref 5–15)
BUN: 6 mg/dL (ref 6–20)
CALCIUM: 8.9 mg/dL (ref 8.9–10.3)
CO2: 25 mmol/L (ref 22–32)
Chloride: 105 mmol/L (ref 101–111)
Creatinine, Ser: 0.67 mg/dL (ref 0.44–1.00)
GFR calc Af Amer: >60 mL/min (ref >60)
GFR calc non Af Amer: >60 mL/min (ref >60)
GLUCOSE: 101 mg/dL — AB (ref 65–99)
Potassium: 3.9 mmol/L (ref 3.5–5.1)
Sodium: 139 mmol/L (ref 135–145)

## 2016-09-04 LAB — CBC WITH DIFFERENTIAL/PLATELET
Basophils Absolute: 0 10*3/uL (ref 0.0–0.1)
Basophils Relative: 0 %
EOS PCT: 1 %
Eosinophils Absolute: 0.1 10*3/uL (ref 0.0–0.7)
HEMATOCRIT: 37.1 % (ref 36.0–46.0)
HEMOGLOBIN: 11.2 g/dL — AB (ref 12.0–15.0)
LYMPHS ABS: 3.8 10*3/uL (ref 0.7–4.0)
LYMPHS PCT: 37 %
MCH: 24.5 pg — AB (ref 26.0–34.0)
MCHC: 30.2 g/dL (ref 30.0–36.0)
MCV: 81.2 fL (ref 78.0–100.0)
Monocytes Absolute: 0.4 10*3/uL (ref 0.1–1.0)
Monocytes Relative: 4 %
NEUTROS ABS: 5.9 10*3/uL (ref 1.7–7.7)
NEUTROS PCT: 58 %
Platelets: 479 10*3/uL — ABNORMAL HIGH (ref 150–400)
RBC: 4.57 MIL/uL (ref 3.87–5.11)
RDW: 16.9 % — ABNORMAL HIGH (ref 11.5–15.5)
WBC: 10.1 10*3/uL (ref 4.0–10.5)

## 2016-09-04 LAB — PROTIME-INR
INR: 1.05
Prothrombin Time: 13.7 seconds (ref 11.4–15.2)

## 2016-09-04 LAB — I-STAT TROPONIN, ED: Troponin i, poc: 0 ng/mL (ref 0.00–0.08)

## 2016-09-04 MED ORDER — MORPHINE SULFATE (PF) 4 MG/ML IV SOLN
4.0000 mg | Freq: Once | INTRAVENOUS | Status: AC
  Administered 2016-09-04: 4 mg via INTRAVENOUS
  Filled 2016-09-04: qty 1

## 2016-09-04 MED ORDER — SODIUM CHLORIDE 0.9 % IV SOLN
Freq: Once | INTRAVENOUS | Status: AC
  Administered 2016-09-04: 20:00:00 via INTRAVENOUS

## 2016-09-04 MED ORDER — IOPAMIDOL (ISOVUE-370) INJECTION 76%
INTRAVENOUS | Status: AC
  Administered 2016-09-04: 100 mL via INTRAVENOUS
  Filled 2016-09-04: qty 100

## 2016-09-04 NOTE — ED Notes (Signed)
Pt ambulatory to restroom. Pt reports cp with exertion.

## 2016-09-04 NOTE — ED Triage Notes (Signed)
Pt in from home via [facility] with c/o NR cp underneath L breast that began 24h ago. Per EMS, pt was seen at [facility] yesterday and treated for GERD. Also today, c/o sob, nausea but no vomit seen. Sats 98% on RA. Given 324 ASA, 1 NTG and 4mg  Zofran given en route. Hx of DM, Diverticultis, HTN. Alert, VSS, NAD

## 2016-09-04 NOTE — ED Notes (Signed)
Patient transported to CT 

## 2016-09-05 NOTE — ED Notes (Signed)
Pt reports understanding of d/c instructions. Pt retains all valuables at discharge.

## 2016-09-05 NOTE — ED Provider Notes (Signed)
[facility] DEPT Provider Note   CSN: [id number] Arrival date & time: 09/04/16  1900     History   Chief Complaint Chief Complaint  Patient presents with  . Chest Pain    HPI Jennifer Dickson is a 41 y.o. female.  HPI Patient reports she's got pain on the left side of her chest. Third approximate 1 day ago. Is very sharp and worse with deep breaths and movement. Patient reports that she feels short of breath as well. Some nausea but no vomiting. No fever. No lower extremity pain. Past Medical History:  Diagnosis Date  . Cervical radiculopathy   . Diabetes mellitus without complication ([place])   . Diverticulosis   . Endometrioma   . Fibromyalgia   . High blood pressure   . IBS (irritable bowel syndrome)   . Interstitial cystitis   . Osteoarthritis   . Tendonitis     Patient Active Problem List   Diagnosis Date Noted  . Obesity, morbid, BMI 50 or higher ([place]) 06/16/2015  . Abdominal wall mass of suprapubic region 06/16/2015    Past Surgical History:  Procedure Laterality Date  . CHOLECYSTECTOMY  1996    OB History    Gravida Para Term Preterm AB Living   5 4 4   1 4    SAB TAB Ectopic Multiple Live Births   1               Home Medications    Prior to Admission medications   Medication Sig Start Date End Date Taking? Authorizing Provider  acetaminophen (TYLENOL) 325 MG tablet Take 325-650 mg by mouth every 6 (six) hours as needed (for headaches).    Yes [provider]  amLODipine (NORVASC) 5 MG tablet Take 5 mg by mouth daily.   Yes [provider]  esomeprazole (NEXIUM) 40 MG capsule Take 40 mg by mouth daily. 11/21/13  Yes [provider]  naproxen (NAPROSYN) 500 MG tablet Take 500 mg by mouth two times a day with food as needed for pain 02/14/16  Yes [provider]  norethindrone (MICRONOR,CAMILA,ERRIN) 0.35 MG tablet Take 1 tablet (0.35 mg total) by mouth daily. 02/09/16  Yes Constant, Peggy, MD  promethazine  (PHENERGAN) 25 MG tablet Take 1 tablet (25 mg total) by mouth every 6 (six) hours as needed for nausea or vomiting. 09/03/16  Yes Joy, Shawn C, PA-C  dicyclomine (BENTYL) 20 MG tablet Take 1 tablet (20 mg total) by mouth 2 (two) times daily. 09/03/16   Joy, Shawn C, PA-C  DULoxetine (CYMBALTA) 30 MG capsule Take 30 mg by mouth at bedtime.    [provider]  rizatriptan (MAXALT) 10 MG tablet Take 1 tablet (10 mg total) by mouth as needed for migraine. May repeat in 2 hours if needed Patient not taking: Reported on 09/04/2016 07/06/16   Melvenia Beam, MD  topiramate (TOPAMAX) 100 MG tablet Take 1 tablet (100 mg total) by mouth daily. Patient not taking: Reported on 09/04/2016 06/06/16   Melvenia Beam, MD    Family History Family History  Problem Relation Age of Onset  . Diabetes Mother   . Cancer Mother   . Heart disease Mother   . Heart disease Brother   . Cancer Sister        lung  . Cancer Sister        brain  . Migraines Neg Hx     Social History Social History  Substance Use Topics  . Smoking status: Former  Smoker    Years: 5.00    Quit date: 2007  . Smokeless tobacco: Never Used  . Alcohol use No     Comment: None since 2013     Allergies   Shellfish allergy; Augmentin [amoxicillin-pot clavulanate]; and Flexeril [cyclobenzaprine]   Review of Systems Review of Systems 10 Systems reviewed and are negative for acute change except as noted in the HPI.   Physical Exam Updated Vital Signs BP (!) 110/58   Pulse 66   Temp 98.7 F (37.1 C) (Oral)   Resp (!) 23   Ht 5\' 7"  (1.702 m)   Wt (!) 160.6 kg (354 lb)   LMP 08/21/2016 (Approximate)   SpO2 98%   BMI 55.44 kg/m   Physical Exam  Constitutional: She is oriented to person, place, and time.  Patient is alert and nontoxic. No respiratory distress at rest. Morbid obesity.  HENT:  Head: Normocephalic and atraumatic.  Mouth/Throat: Oropharynx is clear and moist.  Eyes: EOM are normal.  Cardiovascular:  Normal rate, regular rhythm, normal heart sounds and intact distal pulses.   Pulmonary/Chest: Effort normal and breath sounds normal. She exhibits tenderness.  Abdominal: Soft. She exhibits no distension. There is no tenderness. There is no guarding.  Musculoskeletal: Normal range of motion. She exhibits no edema, tenderness or deformity.  Neurological: She is alert and oriented to person, place, and time. No cranial nerve deficit. She exhibits normal muscle tone. Coordination normal.  Skin: Skin is warm and dry.  Psychiatric: She has a normal mood and affect.     ED Treatments / Results  Labs (all labs ordered are listed, but only abnormal results are displayed) Labs Reviewed  BASIC METABOLIC PANEL - Abnormal; Notable for the following:       Result Value   Glucose, Bld 101 (*)    All other components within normal limits  CBC WITH DIFFERENTIAL/PLATELET - Abnormal; Notable for the following:    Hemoglobin 11.2 (*)    MCH 24.5 (*)    RDW 16.9 (*)    Platelets 479 (*)    All other components within normal limits  PROTIME-INR  I-STAT TROPONIN, ED    EKG  EKG Interpretation  Date/Time:  Monday September 04 2016 19:01:44 EDT Ventricular Rate:  79 PR Interval:    QRS Duration: 103 QT Interval:  426 QTC Calculation: 489 R Axis:   5 Text Interpretation:  Sinus rhythm Borderline prolonged QT interval Baseline wander in lead(s) II III aVF Confirmed by Charlesetta Shanks [phone]) on 09/04/2016 7:04:25 PM       Radiology Ct Angio Chest Pe W/cm &/or Wo Cm  Result Date: 09/05/2016 CLINICAL DATA:  Chest pain beneath the left breast, short of breath and nausea EXAM: CT ANGIOGRAPHY CHEST WITH CONTRAST TECHNIQUE: Multidetector CT imaging of the chest was performed using the standard protocol during bolus administration of intravenous contrast. Multiplanar CT image reconstructions and MIPs were obtained to evaluate the vascular anatomy. CONTRAST:  100 mL Isovue 370 intravenous COMPARISON:   09/03/2016 FINDINGS: Cardiovascular: Satisfactory opacification of the pulmonary arteries to the segmental level. No evidence of pulmonary embolism. Normal heart size. No pericardial effusion. Non aneurysmal aorta. No dissection is seen. Mediastinum/Nodes: No enlarged mediastinal, hilar, or axillary lymph nodes. Thyroid gland, trachea, and esophagus demonstrate no significant findings. Lungs/Pleura: Lungs are clear. No pleural effusion or pneumothorax. Upper Abdomen: No acute abnormality. Musculoskeletal: No chest wall abnormality. No acute or significant osseous findings. Review of the MIP images confirms the above findings. IMPRESSION: Negative  for acute pulmonary embolus or aortic dissection. Clear lung fields. Electronically Signed   By: Donavan Foil M.D.   On: 09/05/2016 00:08   Ct Abdomen Pelvis W Contrast  Result Date: 09/03/2016 CLINICAL DATA:  Upper abdominal pain x3 days EXAM: CT ABDOMEN AND PELVIS WITH CONTRAST TECHNIQUE: Multidetector CT imaging of the abdomen and pelvis was performed using the standard protocol following bolus administration of intravenous contrast. CONTRAST:  100 mL Isovue 300 IV COMPARISON:  None. FINDINGS: Lower chest: Lung bases are clear. Hepatobiliary: Liver is within normal limits. Status post cholecystectomy. Pancreas: Within normal limits. Spleen: Within normal limits. Adrenals/Urinary Tract: Adrenal glands are within normal limits. Malrotated right kidney. Left kidney is within normal limits. No renal calculi or hydronephrosis. Bladder is within normal limits. Stomach/Bowel: Stomach is within normal limits. No evidence of bowel obstruction. Normal appendix (series 2/image 59). Vascular/Lymphatic: No evidence of abdominal aortic aneurysm. No suspicious abdominopelvic lymphadenopathy. Reproductive: Heterogeneous appearance of the uterus with is suspected dominant 3.8 cm subserosal right fundal fibroid (series 2/image 68). Bilateral ovaries are within normal limits. Other:  No abdominopelvic ascites. Musculoskeletal: 2.4 x 2.8 x 2.9 cm soft tissue lesion along the right lower anterior abdominal wall (series 2/image 69). When correlating with prior [facility] CT pelvis report dated 04/28/2015 (using [place]), this previously measured 3.1 x 2.5 cm, grossly unchanged. Visualized osseous structures are within normal limits. IMPRESSION: No CT findings to account for the patient's upper abdominal pain. 2.9 cm soft tissue lesion along the right lower abdominal wall, likely reflecting scar endometriosis versus an abdominal wall desmoid, unchanged since 2017 by report. Electronically Signed   By: Julian Hy M.D.   On: 09/03/2016 18:50    Procedures Procedures (including critical care time)  Medications Ordered in ED Medications  0.9 %  sodium chloride infusion ( Intravenous New Bag/Given 09/04/16 2024)  morphine 4 MG/ML injection 4 mg (4 mg Intravenous Given 09/04/16 2024)  iopamidol (ISOVUE-370) 76 % injection (100 mLs Intravenous Contrast Given 09/04/16 2304)     Initial Impression / Assessment and Plan / ED Course  I have reviewed the triage vital signs and the nursing notes.  Pertinent labs & imaging results that were available during my care of the patient were reviewed by me and considered in my medical decision making (see chart for details).     Final Clinical Impressions(s) / ED Diagnoses   Final diagnoses:  Precordial chest pain  Shortness of breath   Patient expressed significant pleuritic-type chest pain with dyspnea. She does have significantly reproducible chest wall pain as well. CT PE study normal without any evidence of infection or pulmonary embolus. At this time with physical exam positive for reproducible pain, I feels is most likely musculoskeletal pain were the patient's fibromyalgia. Patient can continue home medications for pain as per usual regimen. New Prescriptions New Prescriptions   No medications on file     Charlesetta Shanks, MD 09/05/16 [phone]

## 2016-09-09 ENCOUNTER — Emergency Department (HOSPITAL_COMMUNITY): Payer: Self-pay

## 2016-09-09 ENCOUNTER — Emergency Department (HOSPITAL_COMMUNITY)
Admission: EM | Admit: 2016-09-09 | Discharge: 2016-09-09 | Disposition: A | Discharge status: Home / Self Care | Payer: Self-pay | Attending: Emergency Medicine | Admitting: Emergency Medicine

## 2016-09-09 ENCOUNTER — Encounter (HOSPITAL_COMMUNITY): Payer: Self-pay | Admitting: *Deleted

## 2016-09-09 DIAGNOSIS — E119 Type 2 diabetes mellitus without complications: Secondary | ICD-10-CM | POA: Insufficient documentation

## 2016-09-09 DIAGNOSIS — Z79899 Other long term (current) drug therapy: Secondary | ICD-10-CM | POA: Insufficient documentation

## 2016-09-09 DIAGNOSIS — Z87891 Personal history of nicotine dependence: Secondary | ICD-10-CM | POA: Insufficient documentation

## 2016-09-09 DIAGNOSIS — R1084 Generalized abdominal pain: Secondary | ICD-10-CM

## 2016-09-09 DIAGNOSIS — B9689 Other specified bacterial agents as the cause of diseases classified elsewhere: Secondary | ICD-10-CM | POA: Insufficient documentation

## 2016-09-09 DIAGNOSIS — N760 Acute vaginitis: Secondary | ICD-10-CM | POA: Insufficient documentation

## 2016-09-09 DIAGNOSIS — N730 Acute parametritis and pelvic cellulitis: Secondary | ICD-10-CM | POA: Insufficient documentation

## 2016-09-09 DIAGNOSIS — R0789 Other chest pain: Secondary | ICD-10-CM

## 2016-09-09 LAB — WET PREP, GENITAL
Sperm: NONE SEEN
Trich, Wet Prep: NONE SEEN
YEAST WET PREP: NONE SEEN

## 2016-09-09 LAB — CBC WITH DIFFERENTIAL/PLATELET
BASOS PCT: 0 %
Basophils Absolute: 0 10*3/uL (ref 0.0–0.1)
EOS ABS: 0.1 10*3/uL (ref 0.0–0.7)
EOS PCT: 0 %
HCT: 38 % (ref 36.0–46.0)
Hemoglobin: 12.1 g/dL (ref 12.0–15.0)
LYMPHS ABS: 3.6 10*3/uL (ref 0.7–4.0)
Lymphocytes Relative: 25 %
MCH: 25.1 pg — AB (ref 26.0–34.0)
MCHC: 31.8 g/dL (ref 30.0–36.0)
MCV: 78.7 fL (ref 78.0–100.0)
MONO ABS: 0.7 10*3/uL (ref 0.1–1.0)
MONOS PCT: 5 %
NEUTROS PCT: 70 %
Neutro Abs: 9.8 10*3/uL — ABNORMAL HIGH (ref 1.7–7.7)
PLATELETS: 518 10*3/uL — AB (ref 150–400)
RBC: 4.83 MIL/uL (ref 3.87–5.11)
RDW: 16.8 % — AB (ref 11.5–15.5)
WBC: 14.2 10*3/uL — ABNORMAL HIGH (ref 4.0–10.5)

## 2016-09-09 LAB — I-STAT CHEM 8, ED
BUN: 20 mg/dL (ref 6–20)
CALCIUM ION: 1.1 mmol/L — AB (ref 1.15–1.40)
CHLORIDE: 102 mmol/L (ref 101–111)
Creatinine, Ser: 0.6 mg/dL (ref 0.44–1.00)
GLUCOSE: 109 mg/dL — AB (ref 65–99)
HCT: 40 % (ref 36.0–46.0)
HEMOGLOBIN: 13.6 g/dL (ref 12.0–15.0)
Potassium: 3.9 mmol/L (ref 3.5–5.1)
SODIUM: 138 mmol/L (ref 135–145)
TCO2: 28 mmol/L (ref 22–32)

## 2016-09-09 LAB — COMPREHENSIVE METABOLIC PANEL
ALK PHOS: 91 U/L (ref 38–126)
ALT: 17 U/L (ref 14–54)
AST: 34 U/L (ref 15–41)
Albumin: 3.7 g/dL (ref 3.5–5.0)
Anion gap: 9 (ref 5–15)
BUN: 18 mg/dL (ref 6–20)
CALCIUM: 9.1 mg/dL (ref 8.9–10.3)
CHLORIDE: 101 mmol/L (ref 101–111)
CO2: 24 mmol/L (ref 22–32)
CREATININE: 0.78 mg/dL (ref 0.44–1.00)
GFR calc non Af Amer: >60 mL/min (ref >60)
Glucose, Bld: 108 mg/dL — ABNORMAL HIGH (ref 65–99)
Potassium: 5.4 mmol/L — ABNORMAL HIGH (ref 3.5–5.1)
SODIUM: 134 mmol/L — AB (ref 135–145)
Total Bilirubin: 1.6 mg/dL — ABNORMAL HIGH (ref 0.3–1.2)
Total Protein: 8.1 g/dL (ref 6.5–8.1)

## 2016-09-09 LAB — RAPID URINE DRUG SCREEN, HOSP PERFORMED
Amphetamines: NOT DETECTED
BARBITURATES: NOT DETECTED
BENZODIAZEPINES: NOT DETECTED
COCAINE: NOT DETECTED
Opiates: NOT DETECTED
Tetrahydrocannabinol: NOT DETECTED

## 2016-09-09 LAB — URINALYSIS, ROUTINE W REFLEX MICROSCOPIC
BILIRUBIN URINE: NEGATIVE
GLUCOSE, UA: NEGATIVE mg/dL
Hgb urine dipstick: NEGATIVE
KETONES UR: NEGATIVE mg/dL
LEUKOCYTES UA: NEGATIVE
NITRITE: NEGATIVE
PROTEIN: NEGATIVE mg/dL
Specific Gravity, Urine: 1.027 (ref 1.005–1.030)
pH: 5 (ref 5.0–8.0)

## 2016-09-09 LAB — TROPONIN I: Troponin I: <0.03 ng/mL (ref <0.03)

## 2016-09-09 LAB — LIPASE, BLOOD: LIPASE: 26 U/L (ref 11–51)

## 2016-09-09 LAB — PREGNANCY, URINE: PREG TEST UR: NEGATIVE

## 2016-09-09 MED ORDER — DOXYCYCLINE HYCLATE 100 MG PO CAPS: 100.0000 mg | ORAL_CAPSULE | Freq: Two times a day (BID) | ORAL | 0 refills | Status: AC

## 2016-09-09 MED ORDER — CEFTRIAXONE SODIUM 250 MG IJ SOLR
250.0000 mg | Freq: Once | INTRAMUSCULAR | Status: AC
  Administered 2016-09-09: 250 mg via INTRAMUSCULAR
  Filled 2016-09-09: qty 250

## 2016-09-09 MED ORDER — MORPHINE SULFATE (PF) 4 MG/ML IV SOLN
4.0000 mg | Freq: Once | INTRAVENOUS | Status: AC
  Administered 2016-09-09: 4 mg via INTRAVENOUS
  Filled 2016-09-09: qty 1

## 2016-09-09 MED ORDER — STERILE WATER FOR INJECTION IJ SOLN
INTRAMUSCULAR | Status: AC
  Administered 2016-09-09: 10 mL
  Filled 2016-09-09: qty 10

## 2016-09-09 MED ORDER — METRONIDAZOLE 500 MG PO TABS: 500.0000 mg | ORAL_TABLET | Freq: Two times a day (BID) | ORAL | 0 refills | Status: DC

## 2016-09-09 MED ORDER — SODIUM CHLORIDE 0.9 % IV BOLUS (SEPSIS)
1000.0000 mL | Freq: Once | INTRAVENOUS | Status: AC
  Administered 2016-09-09: 1000 mL via INTRAVENOUS

## 2016-09-09 NOTE — ED Notes (Signed)
ED Provider at bedside. 

## 2016-09-09 NOTE — Discharge Instructions (Signed)
You may have diarrhea from the antibiotics.  It is very important that you continue to take the antibiotics even if you get diarrhea unless a medical professional tells you that you may stop taking them.  If you stop too early the bacteria you are being treated for will become stronger and you may need different, more powerful antibiotics that have more side effects and worsening diarrhea.  Please stay well hydrated and consider probiotics as they may decrease the severity of your diarrhea.  Please be aware that if you take any hormonal contraception (birth control pills, nexplanon, the ring, etc) that your birth control will not work while you are taking antibiotics and you need to use back up protection as directed on the birth control medication information insert.   Today your diagnosed with bacterial vaginosis and received a prescription for metronidazole also known as Flagyl. It is very important that you do not consume any alcohol while taking this medication as it will cause you to become violently ill.  It will take 2-3 days for your Gonorrhea and chlamydia tests come back.   Today you received medications that may make you sleepy or impair your ability to make decisions.  For the next 24 hours please do not drive, operate heavy machinery, care for a small child with out another adult present, or perform any activities that may cause harm to you or someone else if you were to fall asleep or be impaired.

## 2016-09-09 NOTE — ED Triage Notes (Signed)
EMS reports patient has been seen twice this week for same abd pain, has history of abd pain, EKG ok, complete work up 2 days ago. Iv # 20 Rt AC with 4mg  Zofran given.
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
## 2016-09-09 NOTE — ED Provider Notes (Signed)
Complains of right lower quadrant pain radiating to the epigastrium onset 5-6 days ago. She vomited 2 or 3 days ago she is presently hungry. She denies any fever. Her last bowel movement was yesterday which was normal. On exam no distress, abdomen morbidly obese, nontender, no masses   Orlie Dakin, MD 09/09/16 1517

## 2016-09-09 NOTE — ED Notes (Signed)
Pt reports pain in the right lower quadrant that shoots up. Pt states she has a cyst on her uterine walls that is causing the pain.  Pt states she feels like the pain radiates up and to the left upper quadrant.  Pt reports the pain as a 10/10

## 2016-09-14 LAB — GC/CHLAMYDIA PROBE AMP (~~LOC~~) NOT AT ARMC
CHLAMYDIA, DNA PROBE: NEGATIVE
NEISSERIA GONORRHEA: NEGATIVE

## 2016-10-09 ENCOUNTER — Telehealth: Payer: Self-pay | Admitting: Neurology

## 2016-10-09 NOTE — Telephone Encounter (Signed)
Called [place] [facility] to start a PA on the patient's Rizatriptan and once I called they informed me that the patient only has family planning medicaid. I have been informed to call and let the patient know about this and that they can contact the local Health dept if they feel this is a mistake. Confirmation # to describe the call was [id number]

## 2016-10-09 NOTE — Telephone Encounter (Signed)
Called and spoke with the patient and made her aware that we had received the PA for this medication. I informed her that the insurance the pharmacy attempted to run it on was family planning only. Pt states that she is in between restarting her medicaid at this time. Pt has lots of questions about her medication but until her medicaid gets resumed she is unable to schedule a apt. Pt states that she is needing forms for disability.patient states that she will keep [place] in the loop with what she needs from [place]. I also informed the patient of [email or web address] and that she could use a coupon off of that website to get her medication if she needed to in the meantime.

## 2016-10-23 ENCOUNTER — Telehealth: Payer: Self-pay | Admitting: *Deleted

## 2016-10-23 MED ORDER — SUMATRIPTAN SUCCINATE 100 MG PO TABS: 100.0000 mg | ORAL_TABLET | Freq: Once | ORAL | 0 refills | Status: AC | PRN

## 2016-10-23 MED ORDER — TOPIRAMATE 100 MG PO TABS: 100.0000 mg | ORAL_TABLET | Freq: Every day | ORAL | 0 refills | Status: DC

## 2016-10-23 NOTE — Telephone Encounter (Signed)
Refills faxed to office. Refilled sumatriptan and topiramate for one month. Patient needs OV before any further refills.

## 2016-11-13 ENCOUNTER — Emergency Department (HOSPITAL_COMMUNITY)
Admission: EM | Admit: 2016-11-13 | Discharge: 2016-11-13 | Disposition: A | Discharge status: Home / Self Care | Payer: Self-pay | Attending: Emergency Medicine | Admitting: Emergency Medicine

## 2016-11-13 ENCOUNTER — Encounter (HOSPITAL_COMMUNITY): Payer: Self-pay | Admitting: Family Medicine

## 2016-11-13 DIAGNOSIS — R1031 Right lower quadrant pain: Secondary | ICD-10-CM | POA: Insufficient documentation

## 2016-11-13 DIAGNOSIS — R110 Nausea: Secondary | ICD-10-CM | POA: Insufficient documentation

## 2016-11-13 DIAGNOSIS — M25551 Pain in right hip: Secondary | ICD-10-CM | POA: Insufficient documentation

## 2016-11-13 DIAGNOSIS — Z87891 Personal history of nicotine dependence: Secondary | ICD-10-CM | POA: Insufficient documentation

## 2016-11-13 DIAGNOSIS — Z7984 Long term (current) use of oral hypoglycemic drugs: Secondary | ICD-10-CM | POA: Insufficient documentation

## 2016-11-13 DIAGNOSIS — Z79899 Other long term (current) drug therapy: Secondary | ICD-10-CM | POA: Insufficient documentation

## 2016-11-13 DIAGNOSIS — G8929 Other chronic pain: Secondary | ICD-10-CM | POA: Insufficient documentation

## 2016-11-13 DIAGNOSIS — E119 Type 2 diabetes mellitus without complications: Secondary | ICD-10-CM | POA: Insufficient documentation

## 2016-11-13 HISTORY — DX: Deep phlebothrombosis in pregnancy, unspecified trimester: O22.30

## 2016-11-13 LAB — COMPREHENSIVE METABOLIC PANEL
ALBUMIN: 3.5 g/dL (ref 3.5–5.0)
ALT: 35 U/L (ref 14–54)
AST: 17 U/L (ref 15–41)
Alkaline Phosphatase: 110 U/L (ref 38–126)
Anion gap: 8 (ref 5–15)
BUN: 9 mg/dL (ref 6–20)
CALCIUM: 8.9 mg/dL (ref 8.9–10.3)
CHLORIDE: 103 mmol/L (ref 101–111)
CO2: 30 mmol/L (ref 22–32)
CREATININE: 0.69 mg/dL (ref 0.44–1.00)
GFR calc Af Amer: >60 mL/min (ref >60)
GLUCOSE: 103 mg/dL — AB (ref 65–99)
POTASSIUM: 3.2 mmol/L — AB (ref 3.5–5.1)
SODIUM: 141 mmol/L (ref 135–145)
TOTAL PROTEIN: 7.8 g/dL (ref 6.5–8.1)
Total Bilirubin: 0.3 mg/dL (ref 0.3–1.2)

## 2016-11-13 LAB — CBC
HCT: 36.9 % (ref 36.0–46.0)
Hemoglobin: 11.6 g/dL — ABNORMAL LOW (ref 12.0–15.0)
MCH: 25.6 pg — ABNORMAL LOW (ref 26.0–34.0)
MCHC: 31.4 g/dL (ref 30.0–36.0)
MCV: 81.5 fL (ref 78.0–100.0)
PLATELETS: 492 10*3/uL — AB (ref 150–400)
RBC: 4.53 MIL/uL (ref 3.87–5.11)
RDW: 16.4 % — ABNORMAL HIGH (ref 11.5–15.5)
WBC: 10 10*3/uL (ref 4.0–10.5)

## 2016-11-13 LAB — URINALYSIS, ROUTINE W REFLEX MICROSCOPIC
BILIRUBIN URINE: NEGATIVE
Bacteria, UA: NONE SEEN
GLUCOSE, UA: NEGATIVE mg/dL
KETONES UR: NEGATIVE mg/dL
LEUKOCYTES UA: NEGATIVE
Nitrite: NEGATIVE
PH: 6 (ref 5.0–8.0)
Protein, ur: NEGATIVE mg/dL
Specific Gravity, Urine: 1.016 (ref 1.005–1.030)

## 2016-11-13 LAB — LIPASE, BLOOD: LIPASE: 28 U/L (ref 11–51)

## 2016-11-13 MED ORDER — PROMETHAZINE HCL 25 MG PO TABS
25.0000 mg | ORAL_TABLET | Freq: Once | ORAL | Status: AC
  Administered 2016-11-13: 25 mg via ORAL
  Filled 2016-11-13: qty 1

## 2016-11-13 MED ORDER — PROMETHAZINE HCL 25 MG PO TABS: 25.0000 mg | ORAL_TABLET | Freq: Four times a day (QID) | ORAL | 0 refills | Status: AC | PRN

## 2016-11-13 MED ORDER — OXYCODONE-ACETAMINOPHEN 5-325 MG PO TABS
1.0000 | ORAL_TABLET | Freq: Once | ORAL | Status: AC
  Administered 2016-11-13: 1 via ORAL
  Filled 2016-11-13: qty 1

## 2016-11-13 NOTE — ED Notes (Signed)
Pt ambulatory and independent at discharge.  Verbalized understanding of discharge instructions 

## 2016-11-13 NOTE — Discharge Instructions (Signed)
Please call [place] [facility] and tell them you are following up from emergency department visit and need to get established with primary care.   You can call Dr. Gala Lewandowsky office to get established with general surgery, especially since the pain in your abdomen and right hip is getting worse.   Take 1 tablet of Phenergan every 6 hours as needed for nausea and/or vomiting.  If you develop new or concerning symptoms, including vomiting despite taking Phenergan, a fever that does not improve with Tylenol, or other new concerning symptoms, please return to the emergency department for re-evaluation.

## 2016-11-13 NOTE — ED Provider Notes (Signed)
[place] DEPT Provider Note   CSN: [id number] Arrival date & time: 11/13/16  1335     History   Chief Complaint Chief Complaint  Patient presents with  . Pelvic Pain  . Hip Pain    HPI Jennifer Dickson is a 41 y.o. female with a h/o of endometrioma, abdominal wall mass, cystitis, diverticulosis, DM, and morbid obesity who presents to the Emergency Department with constant RLQ and  right hip pain that began months ago and has gradually worsened. She reports nausea x1 week. No fever, chills, emesis, diarrhea, dysuria, frequency, vaginal itching, discharge, or pain. LMP began 3 days ago and she is currently experiencing menstrual bleeding. No sick contacts or recent travel. No constipation. Last bowel movement yesterday was soft and did not require straining.   She reports she was evaluated once by general surgery for a mass in the abdominal wall of the RLQ several years ago, and was advised to try conservative measure for her symptoms, but never followed back up. She reports she lost her medical insurance earlier this year. Surgical hx includes a cholecystectomy.   The history is provided by the patient. No language interpreter was used.  Hip Pain  This is a chronic problem. The current episode started more than 1 week ago. The problem occurs constantly. The problem has been gradually worsening. Associated symptoms include abdominal pain. Pertinent negatives include no chest pain, no headaches and no shortness of breath. The symptoms are aggravated by walking. The symptoms are relieved by lying down. Treatments tried: NSAIDs. The treatment provided no relief.  Abdominal Pain   This is a chronic problem. The current episode started more than 1 week ago. The problem occurs constantly. The problem has been gradually worsening. The pain is located in the RLQ. The quality of the pain is aching and sharp. Associated symptoms include nausea, arthralgias and  myalgias. Pertinent negatives include anorexia, fever, belching, diarrhea, flatus, hematochezia, melena, vomiting, constipation, dysuria, frequency, hematuria and headaches. Her past medical history does not include PUD, gallstones or ulcerative colitis.    Past Medical History:  Diagnosis Date  . Cervical radiculopathy   . Diabetes mellitus without complication ([place])   . Diverticulosis   . DVT (deep vein thrombosis) in pregnancy ([place])   . Endometrioma   . Fibromyalgia   . High blood pressure   . IBS (irritable bowel syndrome)   . Interstitial cystitis   . Osteoarthritis   . Tendonitis     Patient Active Problem List   Diagnosis Date Noted  . Obesity, morbid, BMI 50 or higher ([place]) 06/16/2015  . Abdominal wall mass of suprapubic region 06/16/2015    Past Surgical History:  Procedure Laterality Date  . CHOLECYSTECTOMY  1996    OB History    Gravida Para Term Preterm AB Living   5 4 4   1 4    SAB TAB Ectopic Multiple Live Births   1               Home Medications    Prior to Admission medications   Medication Sig Start Date End Date Taking? Authorizing Provider  amLODipine (NORVASC) 10 MG tablet Take 10 mg by mouth daily. 09/04/16  Yes [provider]  baclofen (LIORESAL) 10 MG tablet Take 10 mg by mouth every 8 (eight) hours as needed for muscle spasms.   Yes [provider]  chlorthalidone (HYGROTON) 25 MG tablet Take 25 mg by mouth daily. 09/06/16  Yes [provider]  esomeprazole (NEXIUM) 40 MG capsule Take 40 mg by mouth daily. 11/21/13  Yes [provider]  fluticasone (FLONASE) 50 MCG/ACT nasal spray Place 2 sprays into both nostrils daily. 09/04/16  Yes [provider]  metFORMIN (GLUCOPHAGE) 500 MG tablet Take 500 mg by mouth daily. 09/04/16  Yes [provider]  naproxen (NAPROSYN) 500 MG tablet Take 500 mg by mouth two times a day with food as needed for pain 02/14/16  Yes [provider]    norethindrone (MICRONOR,CAMILA,ERRIN) 0.35 MG tablet Take 1 tablet (0.35 mg total) by mouth daily. 02/09/16  Yes Constant, Peggy, MD  nystatin (MYCOSTATIN/NYSTOP) powder Apply 1 g 4 (four) times daily as needed topically (Dry Skin).   Yes [provider]  nystatin cream (MYCOSTATIN) Apply 1 application 2 (two) times daily as needed topically for dry skin.   Yes [provider]  SUMAtriptan (IMITREX) 100 MG tablet Take 1 tablet (100 mg total) by mouth once as needed for migraine. May repeat in 2 hours if headache persists or recurs. 10/23/16  Yes Melvenia Beam, MD  topiramate (TOPAMAX) 100 MG tablet Take 1 tablet (100 mg total) by mouth daily. 10/23/16  Yes Melvenia Beam, MD  promethazine (PHENERGAN) 25 MG tablet Take 1 tablet (25 mg total) every 6 (six) hours as needed by mouth for nausea or vomiting. 11/13/16   McDonald, Laymond Purser, PA-C    Family History Family History  Problem Relation Age of Onset  . Diabetes Mother   . Cancer Mother   . Heart disease Mother   . Heart disease Brother   . Cancer Sister        lung  . Cancer Sister        brain  . Migraines Neg Hx     Social History Social History   Tobacco Use  . Smoking status: Former Smoker    Years: 5.00    Last attempt to quit: 2007    Years since quitting: 11.8  . Smokeless tobacco: Never Used  Substance Use Topics  . Alcohol use: No    Comment: None since 2013  . Drug use: No     Allergies   Shellfish allergy; Augmentin [amoxicillin-pot clavulanate]; Flexeril [cyclobenzaprine]; and Sumatriptan   Review of Systems Review of Systems  Constitutional: Negative for activity change, chills and fever.  Respiratory: Negative for shortness of breath.   Cardiovascular: Negative for chest pain.  Gastrointestinal: Positive for abdominal pain and nausea. Negative for anorexia, constipation, diarrhea, flatus, hematochezia, melena and vomiting.  Genitourinary: Positive for vaginal bleeding (Menses).  Negative for dysuria, flank pain, frequency, hematuria, urgency, vaginal discharge and vaginal pain.  Musculoskeletal: Positive for arthralgias and myalgias. Negative for back pain.  Skin: Negative for rash.  Neurological: Negative for headaches.     Physical Exam Updated Vital Signs BP 127/83   Pulse 69   Resp 17   Ht 5\' 7"  (1.702 m)   Wt (!) 149.7 kg (330 lb)   LMP 11/10/2016   SpO2 98%   BMI 51.69 kg/m   Physical Exam  Constitutional: She is oriented to person, place, and time. No distress.  Morbidly obese.   HENT:  Head: Normocephalic.  Eyes: Conjunctivae are normal.  Neck: Neck supple.  Cardiovascular: Normal rate, regular rhythm, normal heart sounds and intact distal pulses. Exam reveals no gallop and no friction rub.  No murmur heard. Pulmonary/Chest: Effort normal and breath sounds normal. No stridor. No respiratory distress. She has no wheezes. She has no  rales. She exhibits no tenderness.  Abdominal: Soft. Bowel sounds are normal. She exhibits no distension and no mass. There is tenderness. There is no rebound and no guarding. No hernia.  Moderate TTP over the RLQ. No rebound or guarding. No tenderness over McBurney's point. No CVA tenderness bilaterally. Minimal tenderness over the RUQ. No left-sided abdominal tenderness.   Neurological: She is alert and oriented to person, place, and time.  TTP over the anterolateral right hip.No TTP over the posterior hip. FROM of the right hip, knee, and ankle. Right knee and ankle are non-tender. Left lower extremity exam is unremarkable. No overlying erythema, edema, warm, or ecchymosis to the skin of the right hip or of the abdomen. No rashes.   Skin: Skin is warm. No rash noted.  Psychiatric: Her behavior is normal.  Nursing note and vitals reviewed.    ED Treatments / Results  Labs (all labs ordered are listed, but only abnormal results are displayed) Labs Reviewed  COMPREHENSIVE METABOLIC PANEL - Abnormal; Notable for  the following components:      Result Value   Potassium 3.2 (*)    Glucose, Bld 103 (*)    All other components within normal limits  CBC - Abnormal; Notable for the following components:   Hemoglobin 11.6 (*)    MCH 25.6 (*)    RDW 16.4 (*)    Platelets 492 (*)    All other components within normal limits  URINALYSIS, ROUTINE W REFLEX MICROSCOPIC - Abnormal; Notable for the following components:   Hgb urine dipstick LARGE (*)    Squamous Epithelial / LPF 0-5 (*)    All other components within normal limits  LIPASE, BLOOD    EKG  EKG Interpretation None       Radiology No results found.  Procedures Procedures (including critical care time)  Medications Ordered in ED Medications  oxyCODONE-acetaminophen (PERCOCET/ROXICET) 5-325 MG per tablet 1 tablet (1 tablet Oral Given 11/13/16 2134)  promethazine (PHENERGAN) tablet 25 mg (25 mg Oral Given 11/13/16 2134)     Initial Impression / Assessment and Plan / ED Course  I have reviewed the triage vital signs and the nursing notes.  Pertinent labs & imaging results that were available during my care of the patient were reviewed by me and considered in my medical decision making (see chart for details).    41 year old female presenting with acute on chronic right lower quadrant pain, hip pain, right upper quadrant pain, and nausea that has been worsening for the last month.  Urinalysis was unremarkable despite Hgb; the patient is currently on her menstrual cycle.  No transaminitis or elevated lipase. No leukocytosis. Doubt intraabdominal infection, pancreatitis, hepatitis, pancreatitis at this time. Pelvic exam is not indicated at this time as she is having no GU symptoms. On physical exam, the patient's pain is reproducible over the right hip and with palpation of the subcutaneous tissue of the abdominal wall as opposed to with deep palpation.  No flank pain. Doubt panniculitis, gout, or septic joint. Low suspicion for rupture of the  ovarian cyst or ovarian torsion, despite cyst noted on the right ovary on 9/18, given the chronicity of the patient's symptoms without significant change. Of note, the patient has a chronic thrombocytosis, but I do not think it is related to her presentation today.  An extensive review of the patient's medical record reveals the patient has been seen multiple times for both generalized abdominal pain and right hip complaints.  CT of the abdomen and  pelvis in August 2018 demonstrated a 2.9 cm soft tissue lesion along the right lower abdominal wall, questionable for an abdominal wall desmoid versus scar endometriosis.  At the location of the patient's pain over the right lower quadrant and right hip is consistent with the soft tissue lesion.  Pelvic ultrasound in September 2018 demonstrated a cyst on the right ovary.  The patient was previously referred to general surgery and had one follow-up appointment several years ago, but has not followed up since.  At this time, I feel no further imaging or workup is indicated.  We will discharge the patient with a referral to primary care since the patient recently lost her insurance as well as a general surgery consult.  I feel that the desmoid wall lesion versus the scar endometriosis lesion seen on CT is the source of her symptoms.  Discussed this plan at length with the patient who is agreeable with the plan.  Strict return precautions given.  She is in no acute distress.  Vital signs are reassuring.  Will discharge the patient home at this time.  Final Clinical Impressions(s) / ED Diagnoses   Final diagnoses:  Chronic right lower quadrant pain    ED Discharge Orders        Ordered    promethazine (PHENERGAN) 25 MG tablet  Every 6 hours PRN     11/13/16 2126       Joline Maxcy A, PA-C 11/15/16 1750    Isla Pence, MD 11/21/16 [phone]

## 2016-11-13 NOTE — ED Triage Notes (Addendum)
Patient is complaining of right lower abd pain/pelvic pain and right hip pain. Patient reports pain started about a week ago. Denies any recent injury. Also, has nausea with the abd pain. Patient started her menstrual on Friday.

## 2017-02-14 DIAGNOSIS — Z0271 Encounter for disability determination: Secondary | ICD-10-CM

## 2017-11-06 ENCOUNTER — Emergency Department (HOSPITAL_COMMUNITY)
Admission: EM | Admit: 2017-11-06 | Discharge: 2017-11-06 | Disposition: A | Discharge status: Home / Self Care | Payer: Medicaid Other | Attending: Emergency Medicine | Admitting: Emergency Medicine

## 2017-11-06 ENCOUNTER — Encounter (HOSPITAL_COMMUNITY): Payer: Self-pay | Admitting: *Deleted

## 2017-11-06 ENCOUNTER — Emergency Department (HOSPITAL_COMMUNITY): Payer: Medicaid Other

## 2017-11-06 ENCOUNTER — Other Ambulatory Visit: Payer: Self-pay

## 2017-11-06 DIAGNOSIS — Z87891 Personal history of nicotine dependence: Secondary | ICD-10-CM | POA: Insufficient documentation

## 2017-11-06 DIAGNOSIS — M25511 Pain in right shoulder: Secondary | ICD-10-CM | POA: Diagnosis not present

## 2017-11-06 DIAGNOSIS — Z7984 Long term (current) use of oral hypoglycemic drugs: Secondary | ICD-10-CM | POA: Diagnosis not present

## 2017-11-06 DIAGNOSIS — E119 Type 2 diabetes mellitus without complications: Secondary | ICD-10-CM | POA: Diagnosis not present

## 2017-11-06 LAB — I-STAT BETA HCG BLOOD, ED (MC, WL, AP ONLY)

## 2017-11-06 MED ORDER — KETOROLAC TROMETHAMINE 60 MG/2ML IM SOLN
60.0000 mg | Freq: Once | INTRAMUSCULAR | Status: AC
  Administered 2017-11-06: 60 mg via INTRAMUSCULAR
  Filled 2017-11-06: qty 2

## 2017-11-06 MED ORDER — METHOCARBAMOL 500 MG PO TABS: 500.0000 mg | ORAL_TABLET | Freq: Three times a day (TID) | ORAL | 0 refills | Status: AC

## 2017-11-06 MED ORDER — METHOCARBAMOL 500 MG PO TABS
500.0000 mg | ORAL_TABLET | Freq: Once | ORAL | Status: AC
  Administered 2017-11-06: 500 mg via ORAL
  Filled 2017-11-06: qty 1

## 2017-11-06 MED ORDER — HYDROCODONE-ACETAMINOPHEN 5-325 MG PO TABS
1.0000 | ORAL_TABLET | Freq: Once | ORAL | Status: AC
  Administered 2017-11-06: 1 via ORAL
  Filled 2017-11-06: qty 1

## 2017-11-06 MED ORDER — IBUPROFEN 800 MG PO TABS: 800.0000 mg | ORAL_TABLET | Freq: Three times a day (TID) | ORAL | 0 refills | Status: AC

## 2017-11-06 NOTE — ED Provider Notes (Signed)
[place] EMERGENCY DEPARTMENT Provider Note   CSN: [id number] Arrival date & time: 11/06/17  1901     History   Chief Complaint Chief Complaint  Patient presents with  . Back Pain  . Shoulder Pain    HPI Jennifer Dickson is a 42 y.o. female.  The history is provided by the patient.  Shoulder Pain   This is a recurrent problem. The current episode started more than 1 week ago. The problem occurs every several days. The problem has been gradually worsening. The pain is present in the right shoulder (upper back). The quality of the pain is described as aching. The pain is at a severity of 3/10. The pain is moderate. Associated symptoms include stiffness. Pertinent negatives include no numbness, no tingling and no itching. The symptoms are aggravated by contact. She has tried OTC pain medications for the symptoms. The treatment provided no relief. There has been a history of trauma.    Past Medical History:  Diagnosis Date  . Cervical radiculopathy   . Diabetes mellitus without complication ([place])   . Diverticulosis   . DVT (deep vein thrombosis) in pregnancy   . Endometrioma   . Fibromyalgia   . High blood pressure   . IBS (irritable bowel syndrome)   . Interstitial cystitis   . Osteoarthritis   . Tendonitis     Patient Active Problem List   Diagnosis Date Noted  . Obesity, morbid, BMI 50 or higher ([place]) 06/16/2015  . Abdominal wall mass of suprapubic region 06/16/2015    Past Surgical History:  Procedure Laterality Date  . CHOLECYSTECTOMY  1996     OB History    Gravida  5   Para  4   Term  4   Preterm      AB  1   Living  4     SAB  1   TAB      Ectopic      Multiple      Live Births               Home Medications    Prior to Admission medications   Medication Sig Start Date End Date Taking? Authorizing Provider  amLODipine (NORVASC) 10 MG tablet Take 10 mg by mouth daily. 09/04/16   [provider]    baclofen (LIORESAL) 10 MG tablet Take 10 mg by mouth every 8 (eight) hours as needed for muscle spasms.    [provider]  chlorthalidone (HYGROTON) 25 MG tablet Take 25 mg by mouth daily. 09/06/16   [provider]  esomeprazole (NEXIUM) 40 MG capsule Take 40 mg by mouth daily. 11/21/13   [provider]  fluticasone (FLONASE) 50 MCG/ACT nasal spray Place 2 sprays into both nostrils daily. 09/04/16   [provider]  ibuprofen (ADVIL,MOTRIN) 800 MG tablet Take 1 tablet (800 mg total) by mouth 3 (three) times daily for 30 doses. 11/06/17 11/16/17  Andrell Tallman, DO  metFORMIN (GLUCOPHAGE) 500 MG tablet Take 500 mg by mouth daily. 09/04/16   [provider]  methocarbamol (ROBAXIN) 500 MG tablet Take 1 tablet (500 mg total) by mouth 3 (three) times daily for 20 doses. 11/06/17 11/13/17  Shenea Giacobbe, DO  naproxen (NAPROSYN) 500 MG tablet Take 500 mg by mouth two times a day with food as needed for pain 02/14/16   [provider]  norethindrone (MICRONOR,CAMILA,ERRIN) 0.35 MG tablet Take 1 tablet (0.35 mg total) by mouth daily. 02/09/16  Constant, Peggy, MD  nystatin (MYCOSTATIN/NYSTOP) powder Apply 1 g 4 (four) times daily as needed topically (Dry Skin).    [provider]  nystatin cream (MYCOSTATIN) Apply 1 application 2 (two) times daily as needed topically for dry skin.    [provider]  promethazine (PHENERGAN) 25 MG tablet Take 1 tablet (25 mg total) every 6 (six) hours as needed by mouth for nausea or vomiting. 11/13/16   McDonald, Mia A, PA-C  SUMAtriptan (IMITREX) 100 MG tablet Take 1 tablet (100 mg total) by mouth once as needed for migraine. May repeat in 2 hours if headache persists or recurs. 10/23/16   Melvenia Beam, MD  topiramate (TOPAMAX) 100 MG tablet Take 1 tablet (100 mg total) by mouth daily. 10/23/16   Melvenia Beam, MD    Family History Family History  Problem Relation Age of Onset  . Diabetes  Mother   . Cancer Mother   . Heart disease Mother   . Heart disease Brother   . Cancer Sister        lung  . Cancer Sister        brain  . Migraines Neg Hx     Social History Social History   Tobacco Use  . Smoking status: Former Smoker    Years: 5.00    Last attempt to quit: 2007    Years since quitting: 12.8  . Smokeless tobacco: Never Used  Substance Use Topics  . Alcohol use: No    Comment: None since 2013  . Drug use: No     Allergies   Shellfish allergy; Augmentin [amoxicillin-pot clavulanate]; Flexeril [cyclobenzaprine]; and Sumatriptan   Review of Systems Review of Systems  Constitutional: Negative for chills and fever.  HENT: Negative for ear pain and sore throat.   Eyes: Negative for pain and visual disturbance.  Respiratory: Negative for cough and shortness of breath.   Cardiovascular: Negative for chest pain and palpitations.  Gastrointestinal: Negative for abdominal pain and vomiting.  Genitourinary: Negative for dysuria and hematuria.  Musculoskeletal: Positive for arthralgias, back pain and stiffness.  Skin: Negative for color change, itching and rash.  Neurological: Negative for tingling, seizures, syncope and numbness.  All other systems reviewed and are negative.    Physical Exam Updated Vital Signs  ED Triage Vitals  Enc Vitals Group     BP 11/06/17 1905 (!) 136/99     Pulse Rate 11/06/17 1905 93     Resp 11/06/17 1905 18     Temp 11/06/17 1905 98.2 F (36.8 C)     Temp Source 11/06/17 1905 Oral     SpO2 11/06/17 1905 100 %     Weight --      Height --      Head Circumference --      Peak Flow --      Pain Score 11/06/17 1910 10     Pain Loc --      Pain Edu? --      Excl. in [place]? --     Physical Exam  Constitutional: She appears well-developed and well-nourished. No distress.  HENT:  Head: Normocephalic and atraumatic.  Eyes: Pupils are equal, round, and reactive to light. Conjunctivae and EOM are normal.  Neck: Normal range  of motion. Neck supple.  Cardiovascular: Normal rate, regular rhythm, normal heart sounds and intact distal pulses.  No murmur heard. Pulmonary/Chest: Effort normal and breath sounds normal. No respiratory distress.  Abdominal: Soft. There is no tenderness.  Musculoskeletal:  Normal range of motion. She exhibits tenderness (TTP around right shoulder area). She exhibits no edema.  TTP to paraspinal thoracic muscles, no midline spinal tenderness  Neurological: She is alert. No cranial nerve deficit or sensory deficit. She exhibits normal muscle tone. Coordination normal.  5+/5 strength in RUE and normal sensation in RUE  Skin: Skin is warm and dry.  Psychiatric: She has a normal mood and affect.  Nursing note and vitals reviewed.    ED Treatments / Results  Labs (all labs ordered are listed, but only abnormal results are displayed) Labs Reviewed  I-STAT BETA HCG BLOOD, ED ([facility], [facility], [facility] ONLY)    EKG None  Radiology Dg Thoracic Spine 2 View  Result Date: 11/06/2017 CLINICAL DATA:  Back pain EXAM: THORACIC SPINE 2 VIEWS COMPARISON:  None. FINDINGS: There is no evidence of thoracic spine fracture. Alignment is normal. No other significant bone abnormalities are identified. IMPRESSION: Negative. Electronically Signed   By: Ulyses Jarred M.D.   On: 11/06/2017 20:39   Dg Shoulder Right  Result Date: 11/06/2017 CLINICAL DATA:  Shoulder pain. EXAM: RIGHT SHOULDER - 2+ VIEW COMPARISON:  None. FINDINGS: There is no evidence of fracture or dislocation. There is no evidence of arthropathy or other focal bone abnormality. Soft tissues are unremarkable. IMPRESSION: Negative. Electronically Signed   By: Ulyses Jarred M.D.   On: 11/06/2017 20:33    Procedures Procedures (including critical care time)  Medications Ordered in ED Medications  HYDROcodone-acetaminophen (NORCO/VICODIN) 5-325 MG per tablet 1 tablet (1 tablet Oral Given 11/06/17 1932)  methocarbamol (ROBAXIN) tablet 500 mg (500 mg Oral  Given 11/06/17 1931)  ketorolac (TORADOL) injection 60 mg (60 mg Intramuscular Given 11/06/17 2055)     Initial Impression / Assessment and Plan / ED Course  I have reviewed the triage vital signs and the nursing notes.  Pertinent labs & imaging results that were available during my care of the patient were reviewed by me and considered in my medical decision making (see chart for details).     Jennifer Dickson is a 42 year old female history of right shoulder pain who presents to the ED with right shoulder pain upper back pain.  Patient with normal vitals.  No fever.  Patient states that she was lifting up her grandson and her right shoulder and upper back acutely hurt.  Patient has had a bad right shoulder for the last several months.  She is supposed to have surgery on her rotator cuff.  Patient has no midline spinal pain on exam.  Has some tenderness around the right shoulder.  No obvious dislocation.  Patient is neurovascularly intact.  X-rays of the right shoulder and back are unremarkable.  History and physical is most likely consistent with a muscle spasm, muscle strain.  Likely acute on chronic pain.  Patient was given Norco, Robaxin, Toradol in the ED.  Given a sling for comfort.  Patient given Motrin and Robaxin prescription for likely msk pain and recommend follow-up with orthopedics and discharged from ED in good condition.  Recommend follow-up with primary care doctor as well for further pain management.  Final Clinical Impressions(s) / ED Diagnoses   Final diagnoses:  Acute pain of right shoulder    ED Discharge Orders         Ordered    ibuprofen (ADVIL,MOTRIN) 800 MG tablet  3 times daily     11/06/17 2055    methocarbamol (ROBAXIN) 500 MG tablet  3 times daily     11/06/17  2055           Lennice Sites, DO 11/06/17 2100

## 2017-11-06 NOTE — ED Triage Notes (Signed)
Pt reports she was playing with her grandson when her R mid-back "gave out" on her x 1 week ago.  She is also c/o chronic R shoulder pain d/t rotator cuff which she needs surgery but her insurance ran out. She has been taking Baclofen for the pain without relief.  She also endorses tingling in her R leg.

## 2017-11-06 NOTE — ED Notes (Signed)
Patient transported to X-ray 

## 2018-01-17 ENCOUNTER — Emergency Department (HOSPITAL_COMMUNITY)
Admission: EM | Admit: 2018-01-17 | Discharge: 2018-01-17 | Disposition: A | Discharge status: Home / Self Care | Payer: Medicaid Other | Attending: Emergency Medicine | Admitting: Emergency Medicine

## 2018-01-17 ENCOUNTER — Emergency Department (HOSPITAL_COMMUNITY): Payer: Medicaid Other

## 2018-01-17 ENCOUNTER — Encounter (HOSPITAL_COMMUNITY): Payer: Self-pay | Admitting: Emergency Medicine

## 2018-01-17 DIAGNOSIS — E119 Type 2 diabetes mellitus without complications: Secondary | ICD-10-CM | POA: Insufficient documentation

## 2018-01-17 DIAGNOSIS — Z79899 Other long term (current) drug therapy: Secondary | ICD-10-CM | POA: Insufficient documentation

## 2018-01-17 DIAGNOSIS — Z87891 Personal history of nicotine dependence: Secondary | ICD-10-CM | POA: Insufficient documentation

## 2018-01-17 DIAGNOSIS — Z7984 Long term (current) use of oral hypoglycemic drugs: Secondary | ICD-10-CM | POA: Insufficient documentation

## 2018-01-17 DIAGNOSIS — R079 Chest pain, unspecified: Secondary | ICD-10-CM

## 2018-01-17 LAB — I-STAT TROPONIN, ED
Troponin i, poc: 0 ng/mL (ref 0.00–0.08)
Troponin i, poc: 0.01 ng/mL (ref 0.00–0.08)

## 2018-01-17 LAB — BASIC METABOLIC PANEL
Anion gap: 8 (ref 5–15)
BUN: 6 mg/dL (ref 6–20)
CO2: 26 mmol/L (ref 22–32)
Calcium: 9 mg/dL (ref 8.9–10.3)
Chloride: 105 mmol/L (ref 98–111)
Creatinine, Ser: 0.68 mg/dL (ref 0.44–1.00)
GFR calc Af Amer: >60 mL/min (ref >60)
GFR calc non Af Amer: >60 mL/min (ref >60)
Glucose, Bld: 106 mg/dL — ABNORMAL HIGH (ref 70–99)
Potassium: 3.5 mmol/L (ref 3.5–5.1)
Sodium: 139 mmol/L (ref 135–145)

## 2018-01-17 LAB — CBC
HCT: 39.5 % (ref 36.0–46.0)
Hemoglobin: 11.5 g/dL — ABNORMAL LOW (ref 12.0–15.0)
MCH: 24 pg — ABNORMAL LOW (ref 26.0–34.0)
MCHC: 29.1 g/dL — ABNORMAL LOW (ref 30.0–36.0)
MCV: 82.3 fL (ref 80.0–100.0)
Platelets: 499 K/uL — ABNORMAL HIGH (ref 150–400)
RBC: 4.8 MIL/uL (ref 3.87–5.11)
RDW: 15.9 % — ABNORMAL HIGH (ref 11.5–15.5)
WBC: 10.3 K/uL (ref 4.0–10.5)
nRBC: 0 % (ref 0.0–0.2)

## 2018-01-17 LAB — I-STAT BETA HCG BLOOD, ED (MC, WL, AP ONLY): I-stat hCG, quantitative: <5 m[IU]/mL (ref <5)

## 2018-01-17 MED ORDER — MELOXICAM 15 MG PO TABS: 15.0000 mg | ORAL_TABLET | Freq: Every day | ORAL | 0 refills | Status: DC | PRN

## 2018-01-17 MED ORDER — HYDROMORPHONE HCL 1 MG/ML IJ SOLN
0.7500 mg | Freq: Once | INTRAMUSCULAR | Status: AC
  Administered 2018-01-17: 0.75 mg via INTRAVENOUS
  Filled 2018-01-17: qty 1

## 2018-01-17 MED ORDER — IBUPROFEN 400 MG PO TABS
400.0000 mg | ORAL_TABLET | Freq: Once | ORAL | Status: AC | PRN
  Administered 2018-01-17: 400 mg via ORAL
  Filled 2018-01-17: qty 1

## 2018-01-17 MED ORDER — KETOROLAC TROMETHAMINE 15 MG/ML IJ SOLN
15.0000 mg | Freq: Once | INTRAMUSCULAR | Status: AC
  Administered 2018-01-17: 15 mg via INTRAVENOUS
  Filled 2018-01-17: qty 1

## 2018-01-17 NOTE — ED Triage Notes (Signed)
Per EMS:  Patient presents for assessment of left sided chest pain , no radiation, with nausea, malaise and increase in sensation with palpation or coughing.  Pt has been out of her BP meds x 2 months, and is also diabetic (CBG 119 en route).

## 2018-01-17 NOTE — ED Notes (Signed)
Reviewed d/c instructions with pt, who verbalized understanding and had no outstanding questions. Pt departed in NAD.   

## 2018-01-27 NOTE — ED Provider Notes (Signed)
[facility] EMERGENCY DEPARTMENT Provider Note   CSN: [id number] Arrival date & time: 01/17/18  1643     History   Chief Complaint Chief Complaint  Patient presents with  . Chest Pain    HPI Jennifer Dickson is a 43 y.o. female.  HPI   43 year old female with chest pain.  Onset 2 days ago.  Left anterior to left axillary region.  Sharp.  Worse with palpation over the area and with coughing.  Does not feel short of breath though.  Cough is nonproductive.  No nausea, palpitations or diaphoresis.  Patient is a diabetic and has hypertension.  She is been out of her hypertensive medications for approximately 2 months.  Past Medical History:  Diagnosis Date  . Cervical radiculopathy   . Diabetes mellitus without complication ([place])   . Diverticulosis   . DVT (deep vein thrombosis) in pregnancy   . Endometrioma   . Fibromyalgia   . High blood pressure   . IBS (irritable bowel syndrome)   . Interstitial cystitis   . Osteoarthritis   . Tendonitis     Patient Active Problem List   Diagnosis Date Noted  . Obesity, morbid, BMI 50 or higher ([place]) 06/16/2015  . Abdominal wall mass of suprapubic region 06/16/2015    Past Surgical History:  Procedure Laterality Date  . CHOLECYSTECTOMY  1996     OB History    Gravida  5   Para  4   Term  4   Preterm      AB  1   Living  4     SAB  1   TAB      Ectopic      Multiple      Live Births               Home Medications    Prior to Admission medications   Medication Sig Start Date End Date Taking? Authorizing Provider  amLODipine (NORVASC) 10 MG tablet Take 10 mg by mouth daily. 09/04/16   [provider]  baclofen (LIORESAL) 10 MG tablet Take 10 mg by mouth every 8 (eight) hours as needed for muscle spasms.    [provider]  chlorthalidone (HYGROTON) 25 MG tablet Take 25 mg by mouth daily. 09/06/16   [provider]  esomeprazole (NEXIUM) 40 MG capsule Take 40  mg by mouth daily. 11/21/13   [provider]  fluticasone (FLONASE) 50 MCG/ACT nasal spray Place 2 sprays into both nostrils daily. 09/04/16   [provider]  meloxicam (MOBIC) 15 MG tablet Take 1 tablet (15 mg total) by mouth daily as needed for pain. 01/17/18   Virgel Manifold, MD  metFORMIN (GLUCOPHAGE) 500 MG tablet Take 500 mg by mouth daily. 09/04/16   [provider]  naproxen (NAPROSYN) 500 MG tablet Take 500 mg by mouth two times a day with food as needed for pain 02/14/16   [provider]  norethindrone (MICRONOR,CAMILA,ERRIN) 0.35 MG tablet Take 1 tablet (0.35 mg total) by mouth daily. 02/09/16   Constant, Peggy, MD  nystatin (MYCOSTATIN/NYSTOP) powder Apply 1 g 4 (four) times daily as needed topically (Dry Skin).    [provider]  nystatin cream (MYCOSTATIN) Apply 1 application 2 (two) times daily as needed topically for dry skin.    [provider]  promethazine (PHENERGAN) 25 MG tablet Take 1 tablet (25 mg total) every 6 (six) hours as needed by mouth for nausea or vomiting. 11/13/16  McDonald, Mia A, PA-C  SUMAtriptan (IMITREX) 100 MG tablet Take 1 tablet (100 mg total) by mouth once as needed for migraine. May repeat in 2 hours if headache persists or recurs. 10/23/16   Melvenia Beam, MD  topiramate (TOPAMAX) 100 MG tablet Take 1 tablet (100 mg total) by mouth daily. 10/23/16   Melvenia Beam, MD    Family History Family History  Problem Relation Age of Onset  . Diabetes Mother   . Cancer Mother   . Heart disease Mother   . Heart disease Brother   . Cancer Sister        lung  . Cancer Sister        brain  . Migraines Neg Hx     Social History Social History   Tobacco Use  . Smoking status: Former Smoker    Years: 5.00    Last attempt to quit: 2007    Years since quitting: 13.0  . Smokeless tobacco: Never Used  Substance Use Topics  . Alcohol use: No    Comment: None since 2013  . Drug use: No      Allergies   Shellfish allergy; Augmentin [amoxicillin-pot clavulanate]; Flexeril [cyclobenzaprine]; and Sumatriptan   Review of Systems Review of Systems  All systems reviewed and negative, other than as noted in HPI.  Physical Exam Updated Vital Signs BP 131/84 (BP Location: Right Arm)   Pulse 78   Temp 98.3 F (36.8 C) (Oral)   Resp 19   SpO2 96%   Physical Exam Vitals signs and nursing note reviewed.  Constitutional:      General: She is not in acute distress.    Appearance: She is well-developed. She is obese.  HENT:     Head: Normocephalic and atraumatic.  Eyes:     General:        Right eye: No discharge.        Left eye: No discharge.     Conjunctiva/sclera: Conjunctivae normal.  Neck:     Musculoskeletal: Neck supple.  Cardiovascular:     Rate and Rhythm: Normal rate and regular rhythm.     Heart sounds: Normal heart sounds. No murmur. No friction rub. No gallop.   Pulmonary:     Effort: Pulmonary effort is normal. No respiratory distress.     Breath sounds: Normal breath sounds.  Abdominal:     General: There is no distension.     Palpations: Abdomen is soft.     Tenderness: There is no abdominal tenderness.  Musculoskeletal:        General: No tenderness.     Comments: Lower extremities symmetric as compared to each other. No calf tenderness. Negative Homan's. No palpable cords.   Skin:    General: Skin is warm and dry.  Neurological:     Mental Status: She is alert.  Psychiatric:        Behavior: Behavior normal.        Thought Content: Thought content normal.      ED Treatments / Results  Labs (all labs ordered are listed, but only abnormal results are displayed) Labs Reviewed  BASIC METABOLIC PANEL - Abnormal; Notable for the following components:      Result Value   Glucose, Bld 106 (*)    All other components within normal limits  CBC - Abnormal; Notable for the following components:   Hemoglobin 11.5 (*)    MCH 24.0 (*)     MCHC 29.1 (*)    RDW  15.9 (*)    Platelets 499 (*)    All other components within normal limits  I-STAT TROPONIN, ED  I-STAT BETA HCG BLOOD, ED ([facility], [facility], [facility] ONLY)  I-STAT TROPONIN, ED    EKG EKG Interpretation  Date/Time:  Thursday January 17 2018 20:24:15 EST Ventricular Rate:  84 PR Interval:    QRS Duration: 89 QT Interval:  387 QTC Calculation: 458 R Axis:   -10 Text Interpretation:  Sinus rhythm LVH by voltage Confirmed by Virgel Manifold [phone]) on 01/17/2018 10:28:27 PM   Radiology No results found.   Dg Chest 2 View  Result Date: 01/17/2018 CLINICAL DATA:  Left-sided chest and breast pain. EXAM: CHEST - 2 VIEW COMPARISON:  11/06/2017 FINDINGS: The heart size and mediastinal contours are within normal limits. Both lungs are clear. The visualized skeletal structures are unremarkable. IMPRESSION: No active cardiopulmonary disease. Electronically Signed   By: Ashley Royalty M.D.   On: 01/17/2018 17:54    Procedures Procedures (including critical care time)  Medications Ordered in ED Medications  ibuprofen (ADVIL,MOTRIN) tablet 400 mg (400 mg Oral Given 01/17/18 1952)  ketorolac (TORADOL) 15 MG/ML injection 15 mg (15 mg Intravenous Given 01/17/18 2330)  HYDROmorphone (DILAUDID) injection 0.75 mg (0.75 mg Intravenous Given 01/17/18 2327)     Initial Impression / Assessment and Plan / ED Course  I have reviewed the triage vital signs and the nursing notes.  Pertinent labs & imaging results that were available during my care of the patient were reviewed by me and considered in my medical decision making (see chart for details).     43 year old female with chest pain.  Atypical for ACS.  Doubt PE, dissection or other emergent process.  ED work-up fairly unremarkable.  It has been determined that no acute conditions requiring further emergency intervention are present at this time. The patient has been advised of the diagnosis and plan. I reviewed any labs and imaging including any  potential incidental findings. I have reviewed nursing notes and appropriate previous records. We have discussed signs and symptoms that warrant return to the ED and they are listed in the discharge instructions.      Final Clinical Impressions(s) / ED Diagnoses   Final diagnoses:  Nonspecific chest pain    ED Discharge Orders         Ordered    meloxicam (MOBIC) 15 MG tablet  Daily PRN     01/17/18 2303           Virgel Manifold, MD 01/27/18 1616

## 2018-06-28 ENCOUNTER — Institutional Professional Consult (permissible substitution): Payer: Medicaid Other | Admitting: Internal Medicine

## 2018-07-23 ENCOUNTER — Institutional Professional Consult (permissible substitution): Payer: Medicaid Other | Admitting: Internal Medicine

## 2018-08-02 LAB — HM PAP SMEAR

## 2018-08-05 ENCOUNTER — Institutional Professional Consult (permissible substitution): Payer: Medicaid Other | Admitting: Internal Medicine

## 2018-08-20 ENCOUNTER — Other Ambulatory Visit: Payer: Self-pay

## 2018-08-20 DIAGNOSIS — M7989 Other specified soft tissue disorders: Secondary | ICD-10-CM

## 2018-09-02 ENCOUNTER — Other Ambulatory Visit: Payer: Self-pay

## 2018-09-02 ENCOUNTER — Ambulatory Visit
Admission: RE | Admit: 2018-09-02 | Discharge: 2018-09-02 | Disposition: A | Discharge status: Home / Self Care | Payer: Medicaid Other | Source: Ambulatory Visit | Attending: Physician Assistant | Admitting: Physician Assistant

## 2018-09-02 ENCOUNTER — Other Ambulatory Visit: Payer: Self-pay | Admitting: Physician Assistant

## 2018-09-02 DIAGNOSIS — M25551 Pain in right hip: Secondary | ICD-10-CM

## 2018-09-02 DIAGNOSIS — M25552 Pain in left hip: Secondary | ICD-10-CM

## 2018-09-12 ENCOUNTER — Other Ambulatory Visit: Payer: Self-pay

## 2018-09-12 ENCOUNTER — Ambulatory Visit (HOSPITAL_COMMUNITY)
Admission: RE | Admit: 2018-09-12 | Discharge: 2018-09-12 | Disposition: A | Discharge status: Home / Self Care | Payer: Medicaid Other | Source: Ambulatory Visit | Attending: Family | Admitting: Family

## 2018-09-12 ENCOUNTER — Ambulatory Visit (INDEPENDENT_AMBULATORY_CARE_PROVIDER_SITE_OTHER): Payer: Medicaid Other | Admitting: Vascular Surgery

## 2018-09-12 ENCOUNTER — Encounter: Payer: Self-pay | Admitting: Vascular Surgery

## 2018-09-12 VITALS — BP 135/92 | HR 85 | Temp 96.9°F | Resp 16 | Ht 66.0 in | Wt 370.0 lb

## 2018-09-12 DIAGNOSIS — I872 Venous insufficiency (chronic) (peripheral): Secondary | ICD-10-CM

## 2018-09-12 DIAGNOSIS — M7989 Other specified soft tissue disorders: Secondary | ICD-10-CM

## 2018-09-12 NOTE — Progress Notes (Signed)
REASON FOR CONSULT:    Chronic venous insufficiency.  ASSESSMENT & PLAN:   CHRONIC VENOUS INSUFFICIENCY: I believe that this patient's symptoms are related to her chronic venous insufficiency.  She has some deep venous reflux and also some superficial venous reflux. I have discussed conservative measures for her venous insufficiency.  I have encouraged her to elevate her legs at least daily and we have discussed the proper positioning for this.  I have encouraged her to wear her compression stockings especially when she is on her feet or sitting for prolonged periods of time.  I have encouraged her to avoid prolonged sitting and standing.  We discussed the importance of exercise specifically walking and water aerobics.  In addition we discussed the importance of maintaining a healthy weight.  Currently I do not think she is a good candidate for laser ablation of the right great saphenous vein as there is only a short segment in the thigh which has reflux and currently this is not especially dilated.  I have explained that if her symptoms progress in the future then I would certainly be happy to see her back at any time and reevaluate her venous disease.  If she does come back I think we probably should evaluate both legs as her symptoms are significant on the left also.  Deitra Mayo, MD, FACS [facility] [phone] Office: [phone]   HPI:   Jennifer Dickson is a pleasant 43 y.o. female, who I saw on November 2017 with chronic venous insufficiency.  At that time she did not have any significant superficial venous reflux.  I recommended knee-high compression stockings leg elevation and exercise.  She was then lost to follow-up.  She returns now as she has developed increasing symptoms in both legs.  She describes achiness and heaviness in both legs which is aggravated by sitting and standing and relieved somewhat with elevation.  She does wear her knee-high compression stockings on which does  help.  She is unaware of any previous history of DVT or phlebitis.  She is currently on disability.  Her activity is fairly limited because of her weight.  Past Medical History:  Diagnosis Date  . Cervical radiculopathy   . Diabetes mellitus without complication ([place])   . Diverticulosis   . DVT (deep vein thrombosis) in pregnancy   . Endometrioma   . Fibromyalgia   . High blood pressure   . IBS (irritable bowel syndrome)   . Interstitial cystitis   . Osteoarthritis   . Tendonitis     Family History  Problem Relation Age of Onset  . Diabetes Mother   . Cancer Mother   . Heart disease Mother   . Heart disease Brother   . Cancer Sister        lung  . Cancer Sister        brain  . Migraines Neg Hx     SOCIAL HISTORY: Social History   Socioeconomic History  . Marital status: Divorced    Spouse name: Not on file  . Number of children: 4  . Years of education: Some college  . Highest education level: Not on file  Occupational History  . Occupation: N/A  Social Needs  . Financial resource strain: Not on file  . Food insecurity    Worry: Not on file    Inability: Not on file  . Transportation needs    Medical: Not on file    Non-medical: Not on file  Tobacco Use  . Smoking  status: Former Smoker    Years: 5.00    Quit date: 2007    Years since quitting: 13.6  . Smokeless tobacco: Never Used  Substance and Sexual Activity  . Alcohol use: No    Comment: None since 2013  . Drug use: No  . Sexual activity: Never  Lifestyle  . Physical activity    Days per week: Not on file    Minutes per session: Not on file  . Stress: Not on file  Relationships  . Social Herbalist on phone: Not on file    Gets together: Not on file    Attends religious service: Not on file    Active member of club or organization: Not on file    Attends meetings of clubs or organizations: Not on file    Relationship status: Not on file  . Intimate partner violence    Fear of  current or ex partner: Not on file    Emotionally abused: Not on file    Physically abused: Not on file    Forced sexual activity: Not on file  Other Topics Concern  . Not on file  Social History Narrative   Lives at home w/ 3 of her children   Right-handed   Caffeine: 1-2 cups of coffee, 4 teas/sodas per day    Allergies  Allergen Reactions  . Cyclobenzaprine Rash and Other (See Comments)    Also made heart race Also made heart race Other Reaction: rapid heart rate  . Shellfish Allergy Anaphylaxis  . Amoxicillin-Pot Clavulanate Rash  . Sumatriptan Palpitations    Current Outpatient Medications  Medication Sig Dispense Refill  . amLODipine (NORVASC) 10 MG tablet Take 10 mg by mouth daily.  1  . baclofen (LIORESAL) 10 MG tablet Take 10 mg by mouth every 8 (eight) hours as needed for muscle spasms.    . chlorthalidone (HYGROTON) 25 MG tablet Take 25 mg by mouth daily.  5  . esomeprazole (NEXIUM) 40 MG capsule Take 40 mg by mouth daily.    . metFORMIN (GLUCOPHAGE) 500 MG tablet Take 500 mg by mouth daily.  2  . naproxen (NAPROSYN) 500 MG tablet Take 500 mg by mouth two times a day with food as needed for pain  1  . norethindrone (MICRONOR,CAMILA,ERRIN) 0.35 MG tablet Take 1 tablet (0.35 mg total) by mouth daily. 1 Package 11  . nystatin (MYCOSTATIN/NYSTOP) powder Apply 1 g 4 (four) times daily as needed topically (Dry Skin).    . nystatin cream (MYCOSTATIN) Apply 1 application 2 (two) times daily as needed topically for dry skin.    . promethazine (PHENERGAN) 25 MG tablet Take 1 tablet (25 mg total) every 6 (six) hours as needed by mouth for nausea or vomiting. 15 tablet 0  . fluticasone (FLONASE) 50 MCG/ACT nasal spray Place 2 sprays into both nostrils daily.  3  . meloxicam (MOBIC) 15 MG tablet Take 1 tablet (15 mg total) by mouth daily as needed for pain. (Patient not taking: Reported on 09/12/2018) 10 tablet 0  . SUMAtriptan (IMITREX) 100 MG tablet Take 1 tablet (100 mg total) by  mouth once as needed for migraine. May repeat in 2 hours if headache persists or recurs. (Patient not taking: Reported on 09/12/2018) 10 tablet 0  . topiramate (TOPAMAX) 100 MG tablet Take 1 tablet (100 mg total) by mouth daily. (Patient not taking: Reported on 09/12/2018) 30 tablet 0   No current facility-administered medications for this visit.  REVIEW OF SYSTEMS:  [X]  denotes positive finding, [ ]  denotes negative finding Cardiac  Comments:  Chest pain or chest pressure: x   Shortness of breath upon exertion: x   Short of breath when lying flat: x   Irregular heart rhythm:        Vascular    Pain in calf, thigh, or hip brought on by ambulation: x   Pain in feet at night that wakes you up from your sleep:  x   Blood clot in your veins:    Leg swelling:  x       Pulmonary    Oxygen at home:    Productive cough:     Wheezing:  x       Neurologic    Sudden weakness in arms or legs:     Sudden numbness in arms or legs:     Sudden onset of difficulty speaking or slurred speech:    Temporary loss of vision in one eye:     Problems with dizziness:  x       Gastrointestinal    Blood in stool:  x   Vomited blood:         Genitourinary    Burning when urinating:  x   Blood in urine:        Psychiatric    Major depression:  x       Hematologic    Bleeding problems:    Problems with blood clotting too easily:        Skin    Rashes or ulcers:        Constitutional    Fever or chills:     PHYSICAL EXAM:   Vitals:   09/12/18 1332  BP: (!) 135/92  Pulse: 85  Resp: 16  Temp: (!) 96.9 F (36.1 C)  TempSrc: Temporal  SpO2: 98%  Weight: (!) 370 lb (167.8 kg)  Height: 5\' 6"  (1.676 m)   Body mass index is 59.72 kg/m.  GENERAL: The patient is a well-nourished female, in no acute distress. The vital signs are documented above. CARDIAC: There is a regular rate and rhythm.  VASCULAR: I do not detect carotid bruits. She has palpable dorsalis pedis pulses bilaterally.  She has mild bilateral lower extremity swelling. I did look at her proximal right great saphenous vein with the SonoSite myself.  She does have reflux in the proximal thigh but the vein is not especially dilated. PULMONARY: There is good air exchange bilaterally without wheezing or rales. ABDOMEN: Soft and non-tender with normal pitched bowel sounds.  MUSCULOSKELETAL: There are no major deformities or cyanosis. NEUROLOGIC: No focal weakness or paresthesias are detected. SKIN: There are no ulcers or rashes noted. PSYCHIATRIC: The patient has a normal affect.  DATA:    VENOUS DUPLEX: I have independently interpreted her right lower extremity venous duplex scan today.  There is no evidence of DVT.  There is deep venous reflux involving the common femoral vein.  There is superficial venous reflux in the proximal right great saphenous vein and the vein is significantly dilated up to 0.65 cm.  There is no significant reflux in the small saphenous vein.

## 2018-09-18 ENCOUNTER — Encounter: Payer: Self-pay | Admitting: Neurology

## 2018-09-18 ENCOUNTER — Ambulatory Visit: Payer: Self-pay | Admitting: Neurology

## 2018-09-23 ENCOUNTER — Ambulatory Visit: Payer: Medicaid Other | Attending: Orthopedic Surgery

## 2018-09-23 ENCOUNTER — Other Ambulatory Visit: Payer: Self-pay

## 2018-09-23 DIAGNOSIS — M542 Cervicalgia: Secondary | ICD-10-CM

## 2018-09-23 DIAGNOSIS — G8929 Other chronic pain: Secondary | ICD-10-CM

## 2018-09-23 DIAGNOSIS — R252 Cramp and spasm: Secondary | ICD-10-CM | POA: Diagnosis present

## 2018-09-23 DIAGNOSIS — M25611 Stiffness of right shoulder, not elsewhere classified: Secondary | ICD-10-CM | POA: Diagnosis present

## 2018-09-23 DIAGNOSIS — R293 Abnormal posture: Secondary | ICD-10-CM | POA: Diagnosis present

## 2018-09-23 DIAGNOSIS — M25512 Pain in left shoulder: Secondary | ICD-10-CM | POA: Diagnosis not present

## 2018-09-23 NOTE — Patient Instructions (Signed)
Scapula and cervical retraction 2-3 reps every hour as able

## 2018-09-23 NOTE — Therapy (Signed)
[facility] [place], [place], [address] Phone: [phone]   Fax:  [phone]  Physical Therapy Evaluation  Patient Details  Name: Jennifer Dickson MRN: [id number] Date of Birth: [birth year] Referring Provider (PT): Jennifer Lynch, MD    Encounter Date: 09/23/2018  PT End of Session - 09/23/18 1444    Visit Number  1    Number of Visits  12    Date for PT Re-Evaluation  11/08/18    Authorization Type  MCD    PT Start Time  0245    PT Stop Time  0320    PT Time Calculation (min)  35 min    Activity Tolerance  Patient tolerated treatment well;Patient limited by pain    Behavior During Therapy  [facility] for tasks assessed/performed       Past Medical History:  Diagnosis Date  . Cervical radiculopathy   . Diabetes mellitus without complication ([place])   . Diverticulosis   . DVT (deep vein thrombosis) in pregnancy   . Endometrioma   . Fibromyalgia   . High blood pressure   . IBS (irritable bowel syndrome)   . Interstitial cystitis   . Osteoarthritis   . Tendonitis     Past Surgical History:  Procedure Laterality Date  . CESAREAN SECTION    . CHOLECYSTECTOMY  1996    There were no vitals filed for this visit.   Subjective Assessment - 09/23/18 1447    Subjective  She reports RT shoulder limited with motion.  Get knots. Did fall in oce which may have started pain.    Diagnostic tests  No MRI   Xray:    Patient Stated Goals  She wants to  be able to move arm withou pain.    Pain Score  6     Pain Location  Shoulder    Pain Orientation  Right   traps RT   Pain Descriptors / Indicators  Aching;Dull    Pain Onset  More than a month ago    Pain Frequency  Constant         [facility] PT Assessment - 09/23/18 0001      Assessment   Medical Diagnosis  RT shoulder and neck pain.     Referring Provider (PT)  Jennifer Lynch, MD     Onset Date/Surgical Date  --   a year plus.    Hand Dominance  Right    Next  MD Visit  As needed maybe in a few weeks    Prior Therapy  Yes a few years agoe      Precautions   Precautions  None      Restrictions   Weight Bearing Restrictions  No      Balance Screen   Has the patient fallen in the past 6 months  Yes    How many times?  2   going down steps   Has the patient had a decrease in activity level because of a fear of falling?   Yes    Is the patient reluctant to leave their home because of a fear of falling?   No      Home Environment   Living Environment  Private residence    Living Arrangements  Alone    Available Help at Discharge  Family    Type of [place] to enter    Entrance Stairs-Number of Steps  5    Entrance  Stairs-Rails  Right;Left    Home Layout  One level    Home Equipment  Walker - 4 wheels      Prior Function   Level of Independence  Needs assistance with homemaking;Needs assistance with ADLs      Cognition   Overall Cognitive Status  Within Functional Limits for tasks assessed      Posture/Postural Control   Posture Comments  forward head and rounded shoulders      ROM / Strength   AROM / PROM / Strength  AROM;PROM;Strength      AROM   AROM Assessment Site  Shoulder;Cervical    Right/Left Shoulder  Right;Left    Right Shoulder Flexion  95 Degrees    Right Shoulder ABduction  125 Degrees   scaption   Right Shoulder Internal Rotation  20 Degrees    Right Shoulder External Rotation  70 Degrees    Left Shoulder Flexion  130 Degrees    Left Shoulder ABduction  140 Degrees    Left Shoulder External Rotation  90 Degrees    Cervical Flexion  35    Cervical Extension  35    Cervical - Right Side Bend  28    Cervical - Left Side Bend  30    Cervical - Right Rotation  52    Cervical - Left Rotation  55                Objective measurements completed on examination: See above findings.              PT Education - 09/23/18 1519    Education Details  HEP POC    Person(s)  Educated  Patient    Methods  Explanation;Demonstration;Tactile cues;Verbal cues;Handout    Comprehension  Verbalized understanding;Returned demonstration       PT Short Term Goals - 09/23/18 1526      PT SHORT TERM GOAL #1   Title  Pt will report compliance with initial HEP to indicate improved functional mobility and decreased pain.  (target: following first visit after eval)    Baseline  Not consistent with HEP    Time  2    Period  Weeks    Status  New      PT SHORT TERM GOAL #2   Title  She will report 1-15% decr pain generally    Baseline  6-10/10 pain depending on activity    Time  2    Period  Weeks    Status  New      PT SHORT TERM GOAL #3   Title  She will demo understanding of good posture    Baseline  forward head and rounded shoulders RT>LT with tight pectorals    Time  2    Period  Weeks    Status  New        PT Long Term Goals - 09/23/18 1528      PT LONG TERM GOAL #1   Title  Pt will be independent with final HEP in order to indicate improved functional mobility and decreased pain.  (target: following third visit after eval)    Baseline  independent with initial HEP    Time  6    Period  Weeks    Status  New      PT LONG TERM GOAL #2   Title  Pt will report no more than 7/10 pain with functional mobility of RT arm and generally 4-5/10 or better.    Baseline  7/10 pain today    Time  6    Period  Weeks    Status  New             Plan - 09/23/18 1520    Clinical Impression Statement  [name] preents  with chronic RT shoulder pain , stiffness  of RT shoulder limiting functional use.    She demo forward head and rounded shoulder posture and is tendres over whole shoulder but in pecs and traps more.      She also reports  hip pain bilaterally. she walks with walker and this extra pressure to RT shoulder does not help with her pain.    She should make some gains with PT and consistent HEP.    Personal Factors and Comorbidities  Other;Time since  onset of injury/illness/exacerbation;Past/Current Experience   obesity, bilateral hip pain   Examination-Activity Limitations  Locomotion Level;Bend;Carry;Squat;Dressing;Stairs;Stand    Examination-Participation Restrictions  Community Activity   home tasks   Stability/Clinical Decision Making  Stable/Uncomplicated    Clinical Decision Making  Low    Rehab Potential  Fair    PT Frequency  --   3 visits   PT Duration  2 weeks   then 2x/week for 4 weeks   PT Treatment/Interventions  Cryotherapy;Electrical Stimulation;Moist Heat;Therapeutic exercise;Patient/family education;Passive range of motion;Manual techniques;Taping    PT Next Visit Plan  Manual , modalities,  HEP  assess HEP    PT Home Exercise Plan  scapula and cervical retraction    Consulted and Agree with Plan of Care  Patient       Patient will benefit from skilled therapeutic intervention in order to improve the following deficits and impairments:  Difficulty walking, Decreased range of motion, Obesity, Increased muscle spasms, Pain, Impaired UE functional use, Decreased activity tolerance, Decreased strength, Postural dysfunction  Visit Diagnosis: Chronic left shoulder pain  Cervicalgia  Stiffness of right shoulder, not elsewhere classified  Cramp and spasm  Abnormal posture     Problem List Patient Active Problem List   Diagnosis Date Noted  . Obesity, morbid, BMI 50 or higher ([place]) 06/16/2015  . Abdominal wall mass of suprapubic region 06/16/2015    Darrel Hoover  PT 09/23/2018, 3:30 PM  [facility] [address] [place], [place], [address] Phone: [phone]   Fax:  [phone]  Name: Jennifer Dickson MRN: [id number] Date of Birth: 1975-06-22

## 2018-10-01 ENCOUNTER — Ambulatory Visit: Payer: Medicaid Other | Admitting: Physical Therapy

## 2018-10-05 ENCOUNTER — Other Ambulatory Visit: Payer: Self-pay

## 2018-10-05 ENCOUNTER — Encounter (HOSPITAL_COMMUNITY): Payer: Self-pay

## 2018-10-05 ENCOUNTER — Emergency Department (HOSPITAL_COMMUNITY): Payer: Medicaid Other

## 2018-10-05 ENCOUNTER — Emergency Department (HOSPITAL_COMMUNITY)
Admission: EM | Admit: 2018-10-05 | Discharge: 2018-10-06 | Disposition: A | Discharge status: Home / Self Care | Payer: Medicaid Other | Attending: Emergency Medicine | Admitting: Emergency Medicine

## 2018-10-05 DIAGNOSIS — E119 Type 2 diabetes mellitus without complications: Secondary | ICD-10-CM | POA: Diagnosis not present

## 2018-10-05 DIAGNOSIS — Z7984 Long term (current) use of oral hypoglycemic drugs: Secondary | ICD-10-CM | POA: Insufficient documentation

## 2018-10-05 DIAGNOSIS — Z87891 Personal history of nicotine dependence: Secondary | ICD-10-CM | POA: Insufficient documentation

## 2018-10-05 DIAGNOSIS — R0789 Other chest pain: Secondary | ICD-10-CM | POA: Insufficient documentation

## 2018-10-05 DIAGNOSIS — Z86718 Personal history of other venous thrombosis and embolism: Secondary | ICD-10-CM | POA: Diagnosis not present

## 2018-10-05 DIAGNOSIS — Z79899 Other long term (current) drug therapy: Secondary | ICD-10-CM | POA: Insufficient documentation

## 2018-10-05 DIAGNOSIS — R079 Chest pain, unspecified: Secondary | ICD-10-CM | POA: Diagnosis present

## 2018-10-05 LAB — LIPASE, BLOOD: Lipase: 23 U/L (ref 11–51)

## 2018-10-05 LAB — TROPONIN I (HIGH SENSITIVITY)
Troponin I (High Sensitivity): 3 ng/L (ref <18)
Troponin I (High Sensitivity): 4 ng/L (ref <18)

## 2018-10-05 LAB — BASIC METABOLIC PANEL
Anion gap: 11 (ref 5–15)
BUN: 7 mg/dL (ref 6–20)
CO2: 26 mmol/L (ref 22–32)
Calcium: 9.2 mg/dL (ref 8.9–10.3)
Chloride: 100 mmol/L (ref 98–111)
Creatinine, Ser: 0.83 mg/dL (ref 0.44–1.00)
GFR calc Af Amer: >60 mL/min (ref >60)
GFR calc non Af Amer: >60 mL/min (ref >60)
Glucose, Bld: 138 mg/dL — ABNORMAL HIGH (ref 70–99)
Potassium: 3.5 mmol/L (ref 3.5–5.1)
Sodium: 137 mmol/L (ref 135–145)

## 2018-10-05 LAB — CBC
HCT: 39.3 % (ref 36.0–46.0)
Hemoglobin: 11.8 g/dL — ABNORMAL LOW (ref 12.0–15.0)
MCH: 25.2 pg — ABNORMAL LOW (ref 26.0–34.0)
MCHC: 30 g/dL (ref 30.0–36.0)
MCV: 84 fL (ref 80.0–100.0)
Platelets: 563 10*3/uL — ABNORMAL HIGH (ref 150–400)
RBC: 4.68 MIL/uL (ref 3.87–5.11)
RDW: 16.9 % — ABNORMAL HIGH (ref 11.5–15.5)
WBC: 11.7 10*3/uL — ABNORMAL HIGH (ref 4.0–10.5)
nRBC: 0 % (ref 0.0–0.2)

## 2018-10-05 LAB — HEPATIC FUNCTION PANEL
ALT: 27 U/L (ref 0–44)
AST: 19 U/L (ref 15–41)
Albumin: 3.3 g/dL — ABNORMAL LOW (ref 3.5–5.0)
Alkaline Phosphatase: 110 U/L (ref 38–126)
Bilirubin, Direct: <0.1 mg/dL (ref 0.0–0.2)
Total Bilirubin: 0.5 mg/dL (ref 0.3–1.2)
Total Protein: 7.3 g/dL (ref 6.5–8.1)

## 2018-10-05 LAB — I-STAT BETA HCG BLOOD, ED (MC, WL, AP ONLY): I-stat hCG, quantitative: <5 m[IU]/mL (ref <5)

## 2018-10-05 LAB — D-DIMER, QUANTITATIVE: D-Dimer, Quant: 0.45 ug/mL-FEU (ref 0.00–0.50)

## 2018-10-05 MED ORDER — ACETAMINOPHEN 325 MG PO TABS
650.0000 mg | ORAL_TABLET | Freq: Once | ORAL | Status: AC
  Administered 2018-10-05: 650 mg via ORAL
  Filled 2018-10-05: qty 2

## 2018-10-05 MED ORDER — FENTANYL CITRATE (PF) 100 MCG/2ML IJ SOLN: 50.0000 ug | Freq: Once | INTRAMUSCULAR | Status: DC

## 2018-10-05 MED ORDER — SODIUM CHLORIDE 0.9% FLUSH
3.0000 mL | Freq: Once | INTRAVENOUS | Status: AC
  Administered 2018-10-05: 23:00:00 3 mL via INTRAVENOUS

## 2018-10-05 MED ORDER — ALUM & MAG HYDROXIDE-SIMETH 200-200-20 MG/5ML PO SUSP
30.0000 mL | Freq: Once | ORAL | Status: AC
  Administered 2018-10-05: 30 mL via ORAL
  Filled 2018-10-05: qty 30

## 2018-10-05 MED ORDER — LIDOCAINE VISCOUS HCL 2 % MT SOLN
15.0000 mL | Freq: Once | OROMUCOSAL | Status: AC
  Administered 2018-10-05: 15 mL via ORAL
  Filled 2018-10-05: qty 15

## 2018-10-05 MED ORDER — FAMOTIDINE IN NACL 20-0.9 MG/50ML-% IV SOLN
20.0000 mg | Freq: Once | INTRAVENOUS | Status: AC
  Administered 2018-10-05: 20 mg via INTRAVENOUS
  Filled 2018-10-05: qty 50

## 2018-10-05 NOTE — ED Provider Notes (Signed)
[facility] EMERGENCY DEPARTMENT Provider Note   CSN: [id number] Arrival date & time: 10/05/18  2009     History   Chief Complaint Chief Complaint  Patient presents with  . Chest Pain    HPI Jennifer Dickson is a 43 y.o. female with a past medical history of fibromyalgia, IBS, diabetes, diverticulosis, morbid obesity, hypertension presents to ED for evaluation of chest pain, shortness of breath, cough productive with mucus for the past 3 days.  Reports nausea, vomiting that has improved since yesterday.  States that her symptoms got worse today.  She reports sick contacts with similar symptoms but no known exposures to COVID-19.  She has a history of GERD but states that this feels different.  She denies any leg swelling, hemoptysis, history of PE.  States that she has a family history of CAD at a young age including her brother and sister.  Denies any dyspnea on exertion, abdominal pain, hematemesis, urinary symptoms.   HPI   Past Medical History:  Diagnosis Date  . Cervical radiculopathy   . Diabetes mellitus without complication ([place])   . Diverticulosis   . DVT (deep vein thrombosis) in pregnancy   . Endometrioma   . Fibromyalgia   . High blood pressure   . IBS (irritable bowel syndrome)   . Interstitial cystitis   . Osteoarthritis   . Tendonitis     Patient Active Problem List   Diagnosis Date Noted  . Obesity, morbid, BMI 50 or higher ([place]) 06/16/2015  . Abdominal wall mass of suprapubic region 06/16/2015    Past Surgical History:  Procedure Laterality Date  . CESAREAN SECTION    . CHOLECYSTECTOMY  1996     OB History    Gravida  5   Para  4   Term  4   Preterm      AB  1   Living  4     SAB  1   TAB      Ectopic      Multiple      Live Births               Home Medications    Prior to Admission medications   Medication Sig Start Date End Date Taking? Authorizing Provider  amLODipine (NORVASC) 10 MG tablet  Take 10 mg by mouth daily. 09/04/16   [provider]  baclofen (LIORESAL) 10 MG tablet Take 10 mg by mouth every 8 (eight) hours as needed for muscle spasms.    [provider]  chlorthalidone (HYGROTON) 25 MG tablet Take 25 mg by mouth daily. 09/06/16   [provider]  esomeprazole (NEXIUM) 40 MG capsule Take 40 mg by mouth daily. 11/21/13   [provider]  fluticasone (FLONASE) 50 MCG/ACT nasal spray Place 2 sprays into both nostrils daily. 09/04/16   [provider]  meloxicam (MOBIC) 15 MG tablet Take 1 tablet (15 mg total) by mouth daily as needed for pain. Patient not taking: Reported on 09/12/2018 01/17/18   Virgel Manifold, MD  metFORMIN (GLUCOPHAGE) 500 MG tablet Take 500 mg by mouth daily. 09/04/16   [provider]  naproxen (NAPROSYN) 500 MG tablet Take 500 mg by mouth two times a day with food as needed for pain 02/14/16   [provider]  norethindrone (MICRONOR,CAMILA,ERRIN) 0.35 MG tablet Take 1 tablet (0.35 mg total) by mouth daily. 02/09/16   Constant, Peggy, MD  nystatin (MYCOSTATIN/NYSTOP) powder Apply 1 g 4 (four) times daily  as needed topically (Dry Skin).    [provider]  nystatin cream (MYCOSTATIN) Apply 1 application 2 (two) times daily as needed topically for dry skin.    [provider]  promethazine (PHENERGAN) 25 MG tablet Take 1 tablet (25 mg total) every 6 (six) hours as needed by mouth for nausea or vomiting. 11/13/16   McDonald, Mia A, PA-C  SUMAtriptan (IMITREX) 100 MG tablet Take 1 tablet (100 mg total) by mouth once as needed for migraine. May repeat in 2 hours if headache persists or recurs. Patient not taking: Reported on 09/12/2018 10/23/16   Melvenia Beam, MD  topiramate (TOPAMAX) 100 MG tablet Take 1 tablet (100 mg total) by mouth daily. Patient not taking: Reported on 09/12/2018 10/23/16   Melvenia Beam, MD    Family History Family History  Problem Relation Age of Onset  .  Diabetes Mother   . Cancer Mother   . Heart disease Mother   . Heart disease Brother   . Cancer Sister        lung  . Cancer Sister        brain  . Migraines Neg Hx     Social History Social History   Tobacco Use  . Smoking status: Former Smoker    Years: 5.00    Quit date: 2007    Years since quitting: 13.7  . Smokeless tobacco: Never Used  Substance Use Topics  . Alcohol use: No    Comment: None since 2013  . Drug use: No     Allergies   Cyclobenzaprine, Shellfish allergy, Amoxicillin-pot clavulanate, and Sumatriptan   Review of Systems Review of Systems  Constitutional: Negative for appetite change, chills and fever.  HENT: Negative for ear pain, rhinorrhea, sneezing and sore throat.   Eyes: Negative for photophobia and visual disturbance.  Respiratory: Positive for cough and shortness of breath. Negative for chest tightness and wheezing.   Cardiovascular: Positive for chest pain. Negative for palpitations.  Gastrointestinal: Negative for abdominal pain, blood in stool, constipation, diarrhea, nausea and vomiting.  Genitourinary: Negative for dysuria, hematuria and urgency.  Musculoskeletal: Negative for myalgias.  Skin: Negative for rash.  Neurological: Negative for dizziness, weakness and light-headedness.     Physical Exam Updated Vital Signs BP 128/84   Pulse 93   Temp 98.8 F (37.1 C) (Oral)   Resp (!) 22   SpO2 97%   Physical Exam Vitals signs and nursing note reviewed.  Constitutional:      General: She is not in acute distress.    Appearance: She is well-developed. She is obese.  HENT:     Head: Normocephalic and atraumatic.     Nose: Nose normal.  Eyes:     General: No scleral icterus.       Left eye: No discharge.     Conjunctiva/sclera: Conjunctivae normal.  Neck:     Musculoskeletal: Normal range of motion and neck supple.  Cardiovascular:     Rate and Rhythm: Regular rhythm. Tachycardia present.     Heart sounds: Normal heart  sounds. No murmur. No friction rub. No gallop.   Pulmonary:     Effort: Pulmonary effort is normal. No respiratory distress.     Breath sounds: Normal breath sounds.  Chest:     Chest wall: Tenderness present.    Abdominal:     General: Bowel sounds are normal. There is no distension.     Palpations: Abdomen is soft.     Tenderness: There is no  abdominal tenderness. There is no guarding.  Musculoskeletal: Normal range of motion.     Comments: No lower extremity edema, erythema or calf tenderness bilaterally.  Skin:    General: Skin is warm and dry.     Findings: No rash.  Neurological:     Mental Status: She is alert.     Motor: No abnormal muscle tone.     Coordination: Coordination normal.      ED Treatments / Results  Labs (all labs ordered are listed, but only abnormal results are displayed) Labs Reviewed  BASIC METABOLIC PANEL - Abnormal; Notable for the following components:      Result Value   Glucose, Bld 138 (*)    All other components within normal limits  CBC - Abnormal; Notable for the following components:   WBC 11.7 (*)    Hemoglobin 11.8 (*)    MCH 25.2 (*)    RDW 16.9 (*)    Platelets 563 (*)    All other components within normal limits  D-DIMER, QUANTITATIVE (NOT AT [facility])  LIPASE, BLOOD  HEPATIC FUNCTION PANEL  I-STAT BETA HCG BLOOD, ED ([facility], [facility], [facility] ONLY)  TROPONIN I (HIGH SENSITIVITY)  TROPONIN I (HIGH SENSITIVITY)    EKG None  Radiology Dg Chest 2 View  Result Date: 10/05/2018 CLINICAL DATA:  Chest pain and shortness of breath EXAM: CHEST - 2 VIEW COMPARISON:  January 17, 2018 FINDINGS: The heart size and mediastinal contours are within normal limits. Both lungs are clear. The visualized skeletal structures are unremarkable. IMPRESSION: No active cardiopulmonary disease. Electronically Signed   By: Dorise Bullion III M.D   On: 10/05/2018 20:46    Procedures Procedures (including critical care time)  Medications Ordered in ED Medications   famotidine (PEPCID) IVPB 20 mg premix (has no administration in time range)  alum & mag hydroxide-simeth (MAALOX/MYLANTA) 200-200-20 MG/5ML suspension 30 mL (has no administration in time range)    And  lidocaine (XYLOCAINE) 2 % viscous mouth solution 15 mL (has no administration in time range)  sodium chloride flush (NS) 0.9 % injection 3 mL (3 mLs Intravenous Given 10/05/18 2241)  acetaminophen (TYLENOL) tablet 650 mg (650 mg Oral Given 10/05/18 2210)     Initial Impression / Assessment and Plan / ED Course  I have reviewed the triage vital signs and the nursing notes.  Pertinent labs & imaging results that were available during my care of the patient were reviewed by me and considered in my medical decision making (see chart for details).     HEAR Score: 34  43 year old female with past medical history of obesity, hypertension, diabetes presents to ED for chest pain, shortness of breath, cough productive with mucus for the past 3 days.  She had some GI symptoms including nausea and vomiting which resolved yesterday.  Symptoms got worse yesterday.  She is concerned because she has a family history of CAD at a young age including her brother and sister.  She denies any personal history of PE or MI in the past.  No specific aggravating or alleviating factor to pain.  On my exam patient is overall well-appearing.  She is tachycardic to low 100s.  No lower extremity edema, erythema or calf tenderness noted concerning for DVT or.  She denies fever and has no recent use of antipyretics.  Patient tachycardic to 107 on arrival here.  Initial troponin is negative.  Chest x-ray is unremarkable.  Will add on d-dimer as well as lipase and LFTs for further  cause of chest pain. She does admit that she is intermittently compliant with her home medications. Will give pepcid and GI cocktail.  Care handed off to oncoming provider [facility] pending remainder of workup. Will discharge home if repeat troponin is  negative and no evidence of PE, if symptoms improve.  Final Clinical Impressions(s) / ED Diagnoses   Final diagnoses:  Chest wall pain    ED Discharge Orders    None       Delia Heady, PA-C 10/05/18 2328    Noemi Chapel, MD 10/06/18 [phone]

## 2018-10-05 NOTE — ED Triage Notes (Signed)
Per [facility] pt from home with chest pain starting last night, worsened through out the day associated with a non-productive cough, hx of gerd but doesn't feel this is like her normal gerd

## 2018-10-06 NOTE — ED Notes (Signed)
Discharge instructions discussed with pt. Pt verbalized understanding. Pt stable and ambulatory. No signautre pad availble

## 2018-10-06 NOTE — ED Provider Notes (Signed)
Patient signed out to me at shift change.  Pending d-dimer and repeat troponin.  12:10 AM D-dimer is negative.  Repeat troponin is 4.  Doubt PE, she is not hypoxic and dimer is normal.  Doubt ACS, HEART score is 2.  No significant changes in troponins.    Plan for discharge to home with PCP follow-up.   Montine Circle, PA-C 10/06/18 0011    Ezequiel Essex, MD 10/06/18 [phone]

## 2018-10-08 ENCOUNTER — Encounter: Payer: Self-pay | Admitting: Physical Therapy

## 2018-10-08 ENCOUNTER — Ambulatory Visit: Payer: Medicaid Other | Admitting: Physical Therapy

## 2018-10-08 ENCOUNTER — Other Ambulatory Visit: Payer: Self-pay

## 2018-10-08 DIAGNOSIS — G8929 Other chronic pain: Secondary | ICD-10-CM

## 2018-10-08 DIAGNOSIS — M25611 Stiffness of right shoulder, not elsewhere classified: Secondary | ICD-10-CM

## 2018-10-08 DIAGNOSIS — R293 Abnormal posture: Secondary | ICD-10-CM

## 2018-10-08 DIAGNOSIS — M25512 Pain in left shoulder: Secondary | ICD-10-CM | POA: Diagnosis not present

## 2018-10-08 DIAGNOSIS — M542 Cervicalgia: Secondary | ICD-10-CM

## 2018-10-08 DIAGNOSIS — R252 Cramp and spasm: Secondary | ICD-10-CM

## 2018-10-08 NOTE — Therapy (Addendum)
[place], [place], [address] Phone: [phone]   Fax:  [phone]  Physical Therapy Treatment/Discharge  Patient Details  Name: Jennifer Dickson MRN: [id number] Date of Birth: 1975-10-19 Referring Provider (PT): Edmonia Lynch, MD    Encounter Date: 10/08/2018  PT End of Session - 10/08/18 1553    Visit Number  2    Number of Visits  12    Date for PT Re-Evaluation  11/08/18    Authorization Type  MCD    Authorization Time Period  9/18-10/8/20, 3 visits    Authorization - Visit Number  1    Authorization - Number of Visits  3    PT Start Time  1554    PT Stop Time  1636   heat at end   PT Time Calculation (min)  42 min       Past Medical History:  Diagnosis Date  . Cervical radiculopathy   . Diabetes mellitus without complication ([place])   . Diverticulosis   . DVT (deep vein thrombosis) in pregnancy   . Endometrioma   . Fibromyalgia   . High blood pressure   . IBS (irritable bowel syndrome)   . Interstitial cystitis   . Osteoarthritis   . Tendonitis     Past Surgical History:  Procedure Laterality Date  . CESAREAN SECTION    . CHOLECYSTECTOMY  1996    There were no vitals filed for this visit.  Subjective Assessment - 10/08/18 1556    Subjective  Pt reports she had a  heart attack scare this past week and is giong to have a CT scan on Friday to see if she has a blockage - has some SOB with the mask on.  She has been doing her HEP    Currently in Pain?  Yes    Pain Score  6     Pain Location  Shoulder    Pain Orientation  Right   top of shoulder   Pain Descriptors / Indicators  Dull    Pain Type  Chronic pain                       [facility] Adult PT Treatment/Exercise - 10/08/18 0001      Exercises   Exercises  Shoulder      Shoulder Exercises: Seated   External Rotation  Strengthening;Right;20 reps;Theraband   2x10 with towel under elbow   Theraband Level  (Shoulder External Rotation)  Level 2 (Red)    Internal Rotation  Strengthening;Right;20 reps;Theraband   2x10 towel under arm   Other Seated Exercises  reviewed scapular retraction - VC to keep up right posture and not hunch, thoracic extension in chair with towel rolled up along spine.     Other Seated Exercises  2x10 reps modified horizontal abuction with arms in front, small ROM ,red band, attempted green band and had increased pain, 15 reps BWD shoulder rolls       Shoulder Exercises: Pulleys   Flexion  2 minutes    Scaption  2 minutes      Modalities   Modalities  Moist Heat      Moist Heat Therapy   Number Minutes Moist Heat  12 Minutes    Moist Heat Location  --   Rt upper trap     Manual Therapy   Manual Therapy  Soft tissue mobilization;Taping    Soft tissue mobilization  IASTM to Rt upper trap and levator  McConnell  to inhibit Rt upper trap                PT Short Term Goals - 09/23/18 1526      PT SHORT TERM GOAL #1   Title  Pt will report compliance with initial HEP to indicate improved functional mobility and decreased pain.  (target: following first visit after eval)    Baseline  Not consistent with HEP    Time  2    Period  Weeks    Status  New      PT SHORT TERM GOAL #2   Title  She will report 1-15% decr pain generally    Baseline  6-10/10 pain depending on activity    Time  2    Period  Weeks    Status  New      PT SHORT TERM GOAL #3   Title  She will demo understanding of good posture    Baseline  forward head and rounded shoulders RT>LT with tight pectorals    Time  2    Period  Weeks    Status  New        PT Long Term Goals - 09/23/18 1528      PT LONG TERM GOAL #1   Title  Pt will be independent with final HEP in order to indicate improved functional mobility and decreased pain.  (target: following third visit after eval)    Baseline  independent with initial HEP    Time  6    Period  Weeks    Status  New      PT LONG TERM  GOAL #2   Title  Pt will report no more than 7/10 pain with functional mobility of RT arm and generally 4-5/10 or better.    Baseline  7/10 pain today    Time  6    Period  Weeks    Status  New            Plan - 10/08/18 1629    Clinical Impression Statement  Pt presents for her first treatment. She has significant postural changes placing pressure on her neck and upper shoulders.  She needed VC to correct her posture during her HEP.  Reported relief after manual therapy and taping, decreased palpable tightness.  The tightness will return if she continues with poor posture.  She would benefit from continued tx to strenghten postural and RTC muscles.    Rehab Potential  Fair    PT Duration  2 weeks    PT Treatment/Interventions  Cryotherapy;Electrical Stimulation;Moist Heat;Therapeutic exercise;Patient/family education;Passive range of motion;Manual techniques;Taping    PT Next Visit Plan  assess response to tape, progress postural strengthening and manual work PRN    Consulted and Agree with Plan of Care  Patient       Patient will benefit from skilled therapeutic intervention in order to improve the following deficits and impairments:  Difficulty walking, Decreased range of motion, Obesity, Increased muscle spasms, Pain, Impaired UE functional use, Decreased activity tolerance, Decreased strength, Postural dysfunction  Visit Diagnosis: Chronic left shoulder pain  Cervicalgia  Stiffness of right shoulder, not elsewhere classified  Cramp and spasm  Abnormal posture     Problem List Patient Active Problem List   Diagnosis Date Noted  . Obesity, morbid, BMI 50 or higher ([place]) 06/16/2015  . Abdominal wall mass of suprapubic region 06/16/2015    Jeral Pinch PT  10/08/2018, 4:31 PM  [facility]  [place], [place], [address] Phone: [phone]   Fax:  [phone]  Name: Jennifer Dickson MRN:  [id number] Date of Birth: Apr 13, 1975   PHYSICAL THERAPY DISCHARGE SUMMARY  Visits from Start of Care: 2 Current functional level related to goals / functional outcomes: Unknown, pt had been having covid type symptoms.  Spoke with daughter and she was supposed to reschedule once feeling better.  They have not called.    Remaining deficits: unknown   Education / Equipment: Initial HEP Plan:                                                    Patient goals were not met. Patient is being discharged due to not returning since the last visit.  ?????    Jeral Pinch, PT 11/18/18 11:01 AM

## 2018-10-14 ENCOUNTER — Ambulatory Visit: Payer: Medicaid Other | Admitting: Physical Therapy

## 2018-10-18 ENCOUNTER — Other Ambulatory Visit: Payer: Self-pay | Admitting: Physician Assistant

## 2018-10-18 DIAGNOSIS — R071 Chest pain on breathing: Secondary | ICD-10-CM

## 2018-10-21 ENCOUNTER — Ambulatory Visit: Payer: Medicaid Other | Attending: Orthopedic Surgery | Admitting: Physical Therapy

## 2018-10-21 ENCOUNTER — Telehealth: Payer: Self-pay | Admitting: Physical Therapy

## 2018-10-21 ENCOUNTER — Other Ambulatory Visit: Payer: Self-pay

## 2018-10-21 NOTE — Telephone Encounter (Signed)
Spoke with pt as her daughter was up front.  Pt is not feeling well still has symptoms.  She will be rescheduled.

## 2018-10-23 ENCOUNTER — Other Ambulatory Visit: Payer: Self-pay | Admitting: Physician Assistant

## 2018-10-23 DIAGNOSIS — R600 Localized edema: Secondary | ICD-10-CM

## 2018-10-28 ENCOUNTER — Other Ambulatory Visit: Payer: Self-pay

## 2018-10-28 ENCOUNTER — Ambulatory Visit
Admission: RE | Admit: 2018-10-28 | Discharge: 2018-10-28 | Disposition: A | Discharge status: Home / Self Care | Payer: Medicaid Other | Source: Ambulatory Visit | Attending: Physician Assistant | Admitting: Physician Assistant

## 2018-10-28 ENCOUNTER — Other Ambulatory Visit: Payer: Medicaid Other

## 2018-10-28 DIAGNOSIS — R071 Chest pain on breathing: Secondary | ICD-10-CM

## 2018-10-28 MED ORDER — IOPAMIDOL (ISOVUE-370) INJECTION 76%
75.0000 mL | Freq: Once | INTRAVENOUS | Status: AC | PRN
  Administered 2018-10-28: 75 mL via INTRAVENOUS

## 2018-10-30 ENCOUNTER — Ambulatory Visit
Admission: RE | Admit: 2018-10-30 | Discharge: 2018-10-30 | Disposition: A | Discharge status: Home / Self Care | Payer: Medicaid Other | Source: Ambulatory Visit | Attending: Physician Assistant | Admitting: Physician Assistant

## 2018-10-30 ENCOUNTER — Ambulatory Visit: Payer: Medicaid Other | Admitting: Physical Therapy

## 2018-10-30 DIAGNOSIS — R600 Localized edema: Secondary | ICD-10-CM

## 2019-01-07 ENCOUNTER — Ambulatory Visit: Payer: Medicaid Other | Attending: Internal Medicine

## 2019-01-07 ENCOUNTER — Other Ambulatory Visit: Payer: Self-pay

## 2019-01-07 DIAGNOSIS — Z20822 Contact with and (suspected) exposure to covid-19: Secondary | ICD-10-CM

## 2019-01-08 LAB — NOVEL CORONAVIRUS, NAA: SARS-CoV-2, NAA: NOT DETECTED

## 2019-01-09 ENCOUNTER — Telehealth: Payer: Self-pay | Admitting: General Practice

## 2019-01-09 NOTE — Telephone Encounter (Signed)
Patient called in and received her negative covid test result  

## 2019-01-18 ENCOUNTER — Encounter (HOSPITAL_COMMUNITY): Payer: Self-pay | Admitting: Emergency Medicine

## 2019-01-18 ENCOUNTER — Emergency Department (HOSPITAL_COMMUNITY)
Admission: EM | Admit: 2019-01-18 | Discharge: 2019-01-18 | Payer: Medicaid Other | Attending: Emergency Medicine | Admitting: Emergency Medicine

## 2019-01-18 ENCOUNTER — Emergency Department (HOSPITAL_COMMUNITY): Payer: Medicaid Other

## 2019-01-18 ENCOUNTER — Other Ambulatory Visit: Payer: Self-pay

## 2019-01-18 DIAGNOSIS — Z87891 Personal history of nicotine dependence: Secondary | ICD-10-CM | POA: Insufficient documentation

## 2019-01-18 DIAGNOSIS — R0781 Pleurodynia: Secondary | ICD-10-CM

## 2019-01-18 DIAGNOSIS — Z7984 Long term (current) use of oral hypoglycemic drugs: Secondary | ICD-10-CM | POA: Insufficient documentation

## 2019-01-18 DIAGNOSIS — Z79899 Other long term (current) drug therapy: Secondary | ICD-10-CM | POA: Diagnosis not present

## 2019-01-18 DIAGNOSIS — R0602 Shortness of breath: Secondary | ICD-10-CM

## 2019-01-18 DIAGNOSIS — U071 COVID-19: Secondary | ICD-10-CM | POA: Diagnosis not present

## 2019-01-18 DIAGNOSIS — E119 Type 2 diabetes mellitus without complications: Secondary | ICD-10-CM | POA: Diagnosis not present

## 2019-01-18 HISTORY — DX: COVID-19: U07.1

## 2019-01-18 LAB — CBC WITH DIFFERENTIAL/PLATELET
Abs Immature Granulocytes: 0.03 10*3/uL (ref 0.00–0.07)
Basophils Absolute: 0 10*3/uL (ref 0.0–0.1)
Basophils Relative: 0 %
Eosinophils Absolute: 0 10*3/uL (ref 0.0–0.5)
Eosinophils Relative: 0 %
HCT: 43.3 % (ref 36.0–46.0)
Hemoglobin: 12.9 g/dL (ref 12.0–15.0)
Immature Granulocytes: 0 %
Lymphocytes Relative: 28 %
Lymphs Abs: 1.9 10*3/uL (ref 0.7–4.0)
MCH: 24.8 pg — ABNORMAL LOW (ref 26.0–34.0)
MCHC: 29.8 g/dL — ABNORMAL LOW (ref 30.0–36.0)
MCV: 83.1 fL (ref 80.0–100.0)
Monocytes Absolute: 0.3 10*3/uL (ref 0.1–1.0)
Monocytes Relative: 4 %
Neutro Abs: 4.5 10*3/uL (ref 1.7–7.7)
Neutrophils Relative %: 68 %
Platelets: 388 10*3/uL (ref 150–400)
RBC: 5.21 MIL/uL — ABNORMAL HIGH (ref 3.87–5.11)
RDW: 16.8 % — ABNORMAL HIGH (ref 11.5–15.5)
WBC: 6.7 10*3/uL (ref 4.0–10.5)
nRBC: 0 % (ref 0.0–0.2)

## 2019-01-18 LAB — I-STAT BETA HCG BLOOD, ED (MC, WL, AP ONLY): I-stat hCG, quantitative: <5 m[IU]/mL (ref <5)

## 2019-01-18 LAB — BASIC METABOLIC PANEL
Anion gap: 6 (ref 5–15)
BUN: <5 mg/dL — ABNORMAL LOW (ref 6–20)
CO2: 28 mmol/L (ref 22–32)
Calcium: 8.7 mg/dL — ABNORMAL LOW (ref 8.9–10.3)
Chloride: 103 mmol/L (ref 98–111)
Creatinine, Ser: 0.68 mg/dL (ref 0.44–1.00)
GFR calc Af Amer: >60 mL/min (ref >60)
GFR calc non Af Amer: >60 mL/min (ref >60)
Glucose, Bld: 126 mg/dL — ABNORMAL HIGH (ref 70–99)
Potassium: 3.6 mmol/L (ref 3.5–5.1)
Sodium: 137 mmol/L (ref 135–145)

## 2019-01-18 LAB — TROPONIN I (HIGH SENSITIVITY): Troponin I (High Sensitivity): <2 ng/L (ref <18)

## 2019-01-18 LAB — D-DIMER, QUANTITATIVE: D-Dimer, Quant: 0.63 ug/mL-FEU — ABNORMAL HIGH (ref 0.00–0.50)

## 2019-01-18 MED ORDER — METOCLOPRAMIDE HCL 5 MG/ML IJ SOLN
10.0000 mg | Freq: Once | INTRAMUSCULAR | Status: AC
  Administered 2019-01-18: 14:00:00 10 mg via INTRAVENOUS
  Filled 2019-01-18: qty 2

## 2019-01-18 MED ORDER — KETOROLAC TROMETHAMINE 15 MG/ML IJ SOLN
15.0000 mg | Freq: Once | INTRAMUSCULAR | Status: AC
  Administered 2019-01-18: 15 mg via INTRAVENOUS
  Filled 2019-01-18: qty 1

## 2019-01-18 MED ORDER — DIPHENHYDRAMINE HCL 50 MG/ML IJ SOLN
25.0000 mg | Freq: Once | INTRAMUSCULAR | Status: AC
  Administered 2019-01-18: 25 mg via INTRAVENOUS
  Filled 2019-01-18: qty 1

## 2019-01-18 MED ORDER — SODIUM CHLORIDE 0.9 % IV BOLUS
1000.0000 mL | Freq: Once | INTRAVENOUS | Status: AC
  Administered 2019-01-18: 1000 mL via INTRAVENOUS

## 2019-01-18 MED ORDER — IOHEXOL 350 MG/ML SOLN
100.0000 mL | Freq: Once | INTRAVENOUS | Status: AC | PRN
  Administered 2019-01-18: 100 mL via INTRAVENOUS

## 2019-01-18 NOTE — Discharge Instructions (Addendum)
RETURN TO THE ER IF ANY WORSENING BREATHING PROBLEMS, SEVERE CHEST PAIN, OR ANY OTHER WORSENING SYMPTOMS.

## 2019-01-18 NOTE — ED Notes (Signed)
Patient transported to CT 

## 2019-01-18 NOTE — ED Provider Notes (Signed)
I received pt in signout from Dr. Regenia Skeeter. She had presented w/ known COVID-19 w/ worsening SOB and pleuritic CP. At time of signout, awaiting CTA chest to r/o PE.  CTA was negative for PE.  Does show infiltrates consistent with COVID-19 infection.  On reassessment, patient is breathing comfortably, speaking in full sentences, and with normal vital signs including O2 saturation 99% on room air.  I spent 10 minutes educating patient on supportive measures at home for COVID-19, quarantine instructions, and return precautions particularly regarding any worsening breathing problems or chest pain.  She voiced understanding.   Dereon Corkery, Wenda Overland, MD 01/18/19 [phone]

## 2019-01-18 NOTE — ED Provider Notes (Signed)
[facility] EMERGENCY DEPARTMENT Provider Note   CSN: [id number] Arrival date & time: 01/18/19  1243     History Chief Complaint  Patient presents with  . COVID +  . Shortness of Breath    Jennifer Dickson is a 44 y.o. female.  HPI  44 year old female presents with a chief complaint of headache.  Headache has been ongoing for about 2 days.  Started off gradually and has slowly worsened.  Feels like a sinus headache or migraine to her.  Mostly right-sided.  No unilateral weakness or numbness but feels diffusely weak.  On and off lightheadedness.  Headache is now an 8 out of 10 though it started off mild.  Has also had Covid for the last week and a half.  More prominent cough earlier that is improving.  Over the last couple days she has also had pleuritic chest pain and feeling like she cannot get a deep breath.  No significant shortness of breath.  Has some right-sided neck discomfort but no neck stiffness.  Past Medical History:  Diagnosis Date  . Cervical radiculopathy   . COVID-19   . Diabetes mellitus without complication ([place])   . Diverticulosis   . DVT (deep vein thrombosis) in pregnancy   . Endometrioma   . Fibromyalgia   . High blood pressure   . IBS (irritable bowel syndrome)   . Interstitial cystitis   . Osteoarthritis   . Tendonitis     Patient Active Problem List   Diagnosis Date Noted  . Obesity, morbid, BMI 50 or higher ([place]) 06/16/2015  . Abdominal wall mass of suprapubic region 06/16/2015    Past Surgical History:  Procedure Laterality Date  . CESAREAN SECTION    . CHOLECYSTECTOMY  1996     OB History    Gravida  5   Para  4   Term  4   Preterm      AB  1   Living  4     SAB  1   TAB      Ectopic      Multiple      Live Births              Family History  Problem Relation Age of Onset  . Diabetes Mother   . Cancer Mother   . Heart disease Mother   . Heart disease Brother   . Cancer Sister    lung  . Cancer Sister        brain  . Migraines Neg Hx     Social History   Tobacco Use  . Smoking status: Former Smoker    Years: 5.00    Quit date: 2007    Years since quitting: 14.0  . Smokeless tobacco: Never Used  Substance Use Topics  . Alcohol use: No    Comment: None since 2013  . Drug use: No    Home Medications Prior to Admission medications   Medication Sig Start Date End Date Taking? Authorizing Provider  amLODipine (NORVASC) 10 MG tablet Take 10 mg by mouth daily. 09/04/16   [provider]  baclofen (LIORESAL) 10 MG tablet Take 10 mg by mouth every 8 (eight) hours as needed for muscle spasms.    [provider]  chlorthalidone (HYGROTON) 25 MG tablet Take 25 mg by mouth daily. 09/06/16   [provider]  esomeprazole (NEXIUM) 40 MG capsule Take 40 mg by mouth daily. 11/21/13   [provider]  fluticasone (  FLONASE) 50 MCG/ACT nasal spray Place 2 sprays into both nostrils daily. 09/04/16   [provider]  meloxicam (MOBIC) 15 MG tablet Take 1 tablet (15 mg total) by mouth daily as needed for pain. Patient not taking: Reported on 09/12/2018 01/17/18   Virgel Manifold, MD  metFORMIN (GLUCOPHAGE) 500 MG tablet Take 500 mg by mouth daily. 09/04/16   [provider]  naproxen (NAPROSYN) 500 MG tablet Take 500 mg by mouth two times a day with food as needed for pain 02/14/16   [provider]  norethindrone (MICRONOR,CAMILA,ERRIN) 0.35 MG tablet Take 1 tablet (0.35 mg total) by mouth daily. 02/09/16   Constant, Peggy, MD  nystatin (MYCOSTATIN/NYSTOP) powder Apply 1 g 4 (four) times daily as needed topically (Dry Skin).    [provider]  nystatin cream (MYCOSTATIN) Apply 1 application 2 (two) times daily as needed topically for dry skin.    [provider]  promethazine (PHENERGAN) 25 MG tablet Take 1 tablet (25 mg total) every 6 (six) hours as needed by mouth for nausea or vomiting. 11/13/16   McDonald,  Mia A, PA-C  SUMAtriptan (IMITREX) 100 MG tablet Take 1 tablet (100 mg total) by mouth once as needed for migraine. May repeat in 2 hours if headache persists or recurs. Patient not taking: Reported on 09/12/2018 10/23/16   Melvenia Beam, MD  topiramate (TOPAMAX) 100 MG tablet Take 1 tablet (100 mg total) by mouth daily. Patient not taking: Reported on 09/12/2018 10/23/16   Melvenia Beam, MD    Allergies    Cyclobenzaprine, Shellfish allergy, Amoxicillin-pot clavulanate, and Sumatriptan  Review of Systems   Review of Systems  Constitutional: Negative for fever.  Respiratory: Positive for cough. Negative for shortness of breath.   Cardiovascular: Positive for chest pain.  Gastrointestinal: Negative for vomiting.  Musculoskeletal: Positive for neck pain. Negative for neck stiffness.  Neurological: Positive for weakness (generalized), light-headedness and headaches. Negative for numbness.  All other systems reviewed and are negative.   Physical Exam Updated Vital Signs BP (!) 134/95   Pulse (!) 101   Temp 98.3 F (36.8 C) (Oral)   Resp 18   SpO2 98%   Physical Exam Vitals and nursing note reviewed.  Constitutional:      Appearance: She is well-developed. She is obese.  HENT:     Head: Normocephalic and atraumatic.     Right Ear: External ear normal.     Left Ear: External ear normal.     Nose: Nose normal.  Eyes:     General:        Right eye: No discharge.        Left eye: No discharge.     Extraocular Movements: Extraocular movements intact.  Neck:   Cardiovascular:     Rate and Rhythm: Normal rate and regular rhythm.     Heart sounds: Normal heart sounds.  Pulmonary:     Effort: Pulmonary effort is normal.     Breath sounds: Normal breath sounds.  Chest:     Chest wall: Tenderness present.  Abdominal:     Palpations: Abdomen is soft.     Tenderness: There is no abdominal tenderness.  Musculoskeletal:     Cervical back: Normal range of motion and neck  supple.  Skin:    General: Skin is warm and dry.  Neurological:     Mental Status: She is alert.     Comments: CN 3-12 grossly intact. 5/5 strength in all 4 extremities. Grossly normal  sensation. Normal finger to nose.   Psychiatric:        Mood and Affect: Mood is not anxious.     ED Results / Procedures / Treatments   Labs (all labs ordered are listed, but only abnormal results are displayed) Labs Reviewed  BASIC METABOLIC PANEL - Abnormal; Notable for the following components:      Result Value   Glucose, Bld 126 (*)    BUN <5 (*)    Calcium 8.7 (*)    All other components within normal limits  CBC WITH DIFFERENTIAL/PLATELET - Abnormal; Notable for the following components:   RBC 5.21 (*)    MCH 24.8 (*)    MCHC 29.8 (*)    RDW 16.8 (*)    All other components within normal limits  D-DIMER, QUANTITATIVE (NOT AT [facility]) - Abnormal; Notable for the following components:   D-Dimer, Quant 0.63 (*)    All other components within normal limits  I-STAT BETA HCG BLOOD, ED ([facility], [facility], [facility] ONLY)  TROPONIN I (HIGH SENSITIVITY)    EKG EKG Interpretation  Date/Time:  Saturday January 18 2019 12:52:18 EST Ventricular Rate:  104 PR Interval:  136 QRS Duration: 78 QT Interval:  358 QTC Calculation: 470 R Axis:   -11 Text Interpretation: Sinus tachycardia Cannot rule out Anterior infarct , age undetermined Abnormal ECG no significant change since sept 2020 Confirmed by Sherwood Gambler [phone]) on 01/18/2019 1:00:40 PM   Radiology DG Chest Portable 1 View  Result Date: 01/18/2019 CLINICAL DATA:  Chest pain.  COVID-19 positive EXAM: PORTABLE CHEST 1 VIEW COMPARISON:  October 05, 2018 FINDINGS: There is mild right base atelectasis. Lungs elsewhere clear. Heart size and pulmonary vascularity are normal. No adenopathy. No bone lesions. IMPRESSION: Mild right base atelectasis. Question earliest changes of pneumonia in the right base. Lungs elsewhere clear. Cardiac silhouette normal. No  adenopathy. Electronically Signed   By: Lowella Grip III M.D.   On: 01/18/2019 13:57    Procedures Procedures (including critical care time)  Medications Ordered in ED Medications  sodium chloride 0.9 % bolus 1,000 mL (1,000 mLs Intravenous Bolus from Bag 01/18/19 1343)  metoCLOPramide (REGLAN) injection 10 mg (10 mg Intravenous Given 01/18/19 1345)  diphenhydrAMINE (BENADRYL) injection 25 mg (25 mg Intravenous Given 01/18/19 1343)  ketorolac (TORADOL) 15 MG/ML injection 15 mg (15 mg Intravenous Given 01/18/19 1435)    ED Course  I have reviewed the triage vital signs and the nursing notes.  Pertinent labs & imaging results that were available during my care of the patient were reviewed by me and considered in my medical decision making (see chart for details).    MDM Rules/Calculators/A&P                      Headache has been gradual and sounds benign.  Highly doubt meningitis or acute CNS emergency.  Do not think CT or other imaging needed.  Neuro exam benign.  As for this pleuritic chest pain, given she seems to be improving from a Covid standpoint, will work-up for PE with mild tachycardia and obesity and Covid infection.  CT angiography currently pending.  Care transferred to Dr. Rex Kras.  Jennifer Dickson was evaluated in Emergency Department on 01/18/2019 for the symptoms described in the history of present illness. She was evaluated in the context of the global COVID-19 pandemic, which necessitated consideration that the patient might be at risk for infection with the SARS-CoV-2 virus that causes COVID-19. Institutional protocols and  algorithms that pertain to the evaluation of patients at risk for COVID-19 are in a state of rapid change based on information released by regulatory bodies including the CDC and federal and state organizations. These policies and algorithms were followed during the patient's care in the ED.  Final Clinical Impression(s) / ED Diagnoses Final diagnoses:    None    Rx / DC Orders ED Discharge Orders    None       Sherwood Gambler, MD 01/18/19 1515

## 2019-01-18 NOTE — ED Triage Notes (Addendum)
Pt to triage via [facility] from home.  COVID + 12/31.  Symptoms started 12/30.  Reports SOB and pain across chest.  Also reports headache with history of same that is worse over the last 2 days.  SOB with exertion.

## 2019-05-06 ENCOUNTER — Emergency Department (HOSPITAL_COMMUNITY): Payer: Medicaid Other

## 2019-05-06 ENCOUNTER — Encounter (HOSPITAL_COMMUNITY): Payer: Self-pay | Admitting: Emergency Medicine

## 2019-05-06 ENCOUNTER — Emergency Department (HOSPITAL_COMMUNITY)
Admission: EM | Admit: 2019-05-06 | Discharge: 2019-05-07 | Disposition: A | Discharge status: Home / Self Care | Payer: Medicaid Other | Attending: Emergency Medicine | Admitting: Emergency Medicine

## 2019-05-06 ENCOUNTER — Other Ambulatory Visit: Payer: Self-pay

## 2019-05-06 DIAGNOSIS — R0600 Dyspnea, unspecified: Secondary | ICD-10-CM

## 2019-05-06 DIAGNOSIS — E669 Obesity, unspecified: Secondary | ICD-10-CM | POA: Insufficient documentation

## 2019-05-06 DIAGNOSIS — Z8616 Personal history of COVID-19: Secondary | ICD-10-CM | POA: Diagnosis not present

## 2019-05-06 DIAGNOSIS — R0789 Other chest pain: Secondary | ICD-10-CM

## 2019-05-06 DIAGNOSIS — Z79899 Other long term (current) drug therapy: Secondary | ICD-10-CM | POA: Insufficient documentation

## 2019-05-06 DIAGNOSIS — Z87891 Personal history of nicotine dependence: Secondary | ICD-10-CM | POA: Diagnosis not present

## 2019-05-06 DIAGNOSIS — Z86718 Personal history of other venous thrombosis and embolism: Secondary | ICD-10-CM | POA: Insufficient documentation

## 2019-05-06 DIAGNOSIS — R10816 Epigastric abdominal tenderness: Secondary | ICD-10-CM | POA: Diagnosis not present

## 2019-05-06 DIAGNOSIS — R10812 Left upper quadrant abdominal tenderness: Secondary | ICD-10-CM | POA: Diagnosis not present

## 2019-05-06 DIAGNOSIS — R0609 Other forms of dyspnea: Secondary | ICD-10-CM

## 2019-05-06 DIAGNOSIS — E119 Type 2 diabetes mellitus without complications: Secondary | ICD-10-CM | POA: Diagnosis not present

## 2019-05-06 DIAGNOSIS — R110 Nausea: Secondary | ICD-10-CM | POA: Insufficient documentation

## 2019-05-06 DIAGNOSIS — Z6841 Body Mass Index (BMI) 40.0 and over, adult: Secondary | ICD-10-CM | POA: Diagnosis not present

## 2019-05-06 LAB — BASIC METABOLIC PANEL
Anion gap: 14 (ref 5–15)
BUN: 16 mg/dL (ref 6–20)
CO2: 27 mmol/L (ref 22–32)
Calcium: 9.7 mg/dL (ref 8.9–10.3)
Chloride: 96 mmol/L — ABNORMAL LOW (ref 98–111)
Creatinine, Ser: 1 mg/dL (ref 0.44–1.00)
GFR calc Af Amer: >60 mL/min (ref >60)
GFR calc non Af Amer: >60 mL/min (ref >60)
Glucose, Bld: 144 mg/dL — ABNORMAL HIGH (ref 70–99)
Potassium: 3.6 mmol/L (ref 3.5–5.1)
Sodium: 137 mmol/L (ref 135–145)

## 2019-05-06 LAB — I-STAT BETA HCG BLOOD, ED (MC, WL, AP ONLY): I-stat hCG, quantitative: <5 m[IU]/mL (ref <5)

## 2019-05-06 LAB — CBC
HCT: 43.6 % (ref 36.0–46.0)
Hemoglobin: 13 g/dL (ref 12.0–15.0)
MCH: 24.8 pg — ABNORMAL LOW (ref 26.0–34.0)
MCHC: 29.8 g/dL — ABNORMAL LOW (ref 30.0–36.0)
MCV: 83 fL (ref 80.0–100.0)
Platelets: 555 10*3/uL — ABNORMAL HIGH (ref 150–400)
RBC: 5.25 MIL/uL — ABNORMAL HIGH (ref 3.87–5.11)
RDW: 17.1 % — ABNORMAL HIGH (ref 11.5–15.5)
WBC: 18.7 10*3/uL — ABNORMAL HIGH (ref 4.0–10.5)
nRBC: 0 % (ref 0.0–0.2)

## 2019-05-06 LAB — TROPONIN I (HIGH SENSITIVITY): Troponin I (High Sensitivity): 4 ng/L (ref <18)

## 2019-05-06 NOTE — ED Triage Notes (Signed)
Pt arrives via EMS with reports of intermittent CP and SOB for 2 weeks. Central and left breast sharp pains, worse with movement and inhalation. Reports nausea for a week. Hx of cardiomyopathy. 324 ASA given.

## 2019-05-07 ENCOUNTER — Emergency Department (HOSPITAL_COMMUNITY): Payer: Medicaid Other

## 2019-05-07 ENCOUNTER — Encounter (HOSPITAL_COMMUNITY): Payer: Self-pay | Admitting: Radiology

## 2019-05-07 LAB — HEPATIC FUNCTION PANEL
ALT: 49 U/L — ABNORMAL HIGH (ref 0–44)
AST: 38 U/L (ref 15–41)
Albumin: 3.8 g/dL (ref 3.5–5.0)
Alkaline Phosphatase: 112 U/L (ref 38–126)
Bilirubin, Direct: 0.2 mg/dL (ref 0.0–0.2)
Indirect Bilirubin: 0.5 mg/dL (ref 0.3–0.9)
Total Bilirubin: 0.7 mg/dL (ref 0.3–1.2)
Total Protein: 8.3 g/dL — ABNORMAL HIGH (ref 6.5–8.1)

## 2019-05-07 LAB — LIPASE, BLOOD: Lipase: 24 U/L (ref 11–51)

## 2019-05-07 LAB — URINALYSIS, ROUTINE W REFLEX MICROSCOPIC
Bilirubin Urine: NEGATIVE
Glucose, UA: >=500 mg/dL — AB
Hgb urine dipstick: NEGATIVE
Ketones, ur: NEGATIVE mg/dL
Leukocytes,Ua: NEGATIVE
Nitrite: NEGATIVE
Protein, ur: 30 mg/dL — AB
Specific Gravity, Urine: 1.028 (ref 1.005–1.030)
pH: 5 (ref 5.0–8.0)

## 2019-05-07 LAB — D-DIMER, QUANTITATIVE: D-Dimer, Quant: 0.77 ug/mL-FEU — ABNORMAL HIGH (ref 0.00–0.50)

## 2019-05-07 LAB — BRAIN NATRIURETIC PEPTIDE: B Natriuretic Peptide: 25.3 pg/mL (ref 0.0–100.0)

## 2019-05-07 LAB — TROPONIN I (HIGH SENSITIVITY): Troponin I (High Sensitivity): 6 ng/L (ref <18)

## 2019-05-07 MED ORDER — IOHEXOL 300 MG/ML  SOLN
100.0000 mL | Freq: Once | INTRAMUSCULAR | Status: AC | PRN
  Administered 2019-05-07: 100 mL via INTRAVENOUS

## 2019-05-07 NOTE — ED Provider Notes (Signed)
[facility] EMERGENCY DEPARTMENT Provider Note   CSN: [id number] Arrival date & time: 05/06/19  1803     History Chief Complaint  Patient presents with  . Chest Pain    Jennifer Dickson is a 44 y.o. female with history of diabetes mellitus, fibromyalgia, hypertension, interstitial cystitis, morbid obesity presents for evaluation of acute onset, progressively worsening shortness of breath and chest pain for 2 weeks.  She reports dyspnea on exertion, orthopnea, PND.  She reports that with ambulating only a few feet she will become lightheaded and very short of breath.  She also notes constant dull chest pains which is left-sided, under the left breast and substernal.  At times it is sharp.  It worsens with ambulation, activity, and certain movements; improves with rest.  She has had some nausea but no vomiting.  She also notes some upper abdominal pain and left lower quadrant abdominal pains which are crampy and intermittent have been present for 1 week.  She denies diarrhea, constipation, urinary symptoms.  No fevers or chills but she feels generally weak.  No known sick contacts.  She notes chronic right lower extremity swelling which is intermittent and unchanged but has had some pain to the bilateral lower extremities here recently.  She denies recent travel or surgeries, hemoptysis, prior history of DVT or PE (though her medical history notes DVT in pregnancy).  No hormone replacement therapy.  She is not on any blood thinners.  She is a former smoker, quit several years ago.  She has not tried anything for her symptoms.  She went to see her sleep study specialist today who recommended presentation to the ED for further evaluation and management.  The history is provided by the patient and medical records.       Past Medical History:  Diagnosis Date  . Cervical radiculopathy   . COVID-19   . Diabetes mellitus without complication ([place])   . Diverticulosis   . DVT  (deep vein thrombosis) in pregnancy   . Endometrioma   . Fibromyalgia   . High blood pressure   . IBS (irritable bowel syndrome)   . Interstitial cystitis   . Osteoarthritis   . Tendonitis     Patient Active Problem List   Diagnosis Date Noted  . Obesity, morbid, BMI 50 or higher ([place]) 06/16/2015  . Abdominal wall mass of suprapubic region 06/16/2015    Past Surgical History:  Procedure Laterality Date  . CESAREAN SECTION    . CHOLECYSTECTOMY  1996     OB History    Gravida  5   Para  4   Term  4   Preterm      AB  1   Living  4     SAB  1   TAB      Ectopic      Multiple      Live Births              Family History  Problem Relation Age of Onset  . Diabetes Mother   . Cancer Mother   . Heart disease Mother   . Heart disease Brother   . Cancer Sister        lung  . Cancer Sister        brain  . Migraines Neg Hx     Social History   Tobacco Use  . Smoking status: Former Smoker    Years: 5.00    Quit date: 2007  Years since quitting: 14.3  . Smokeless tobacco: Never Used  Substance Use Topics  . Alcohol use: No    Comment: None since 2013  . Drug use: No    Home Medications Prior to Admission medications   Medication Sig Start Date End Date Taking? Authorizing Provider  albuterol (VENTOLIN HFA) 108 (90 Base) MCG/ACT inhaler Inhale 1 puff into the lungs every 4 (four) hours as needed. 04/03/18  Yes [provider]  amLODipine (NORVASC) 5 MG tablet Take 5 mg by mouth daily. 03/17/19  Yes [provider]  atorvastatin (LIPITOR) 20 MG tablet Take 1 tablet by mouth daily. 02/13/18 02/03/20 Yes [provider]  baclofen (LIORESAL) 10 MG tablet Take 10 mg by mouth every 8 (eight) hours as needed for muscle spasms.   Yes [provider]  carbamide peroxide (DEBROX) 6.5 % OTIC solution Place 5 drops into both ears daily as needed (Ear wax removal).   Yes [provider]  cetirizine (ZYRTEC) 10 MG  tablet Take 10 mg by mouth daily.   Yes [provider]  chlorthalidone (HYGROTON) 25 MG tablet Take 25 mg by mouth daily. 03/17/19  Yes [provider]  Cholecalciferol (VITAMIN D3 PO) Take 1 tablet by mouth daily.   Yes [provider]  diclofenac Sodium (VOLTAREN) 1 % GEL Apply 2 g topically 2 (two) times daily as needed (Joint pain).   Yes [provider]  esomeprazole (NEXIUM) 40 MG capsule Take 40 mg by mouth daily. 11/21/13  Yes [provider]  ferrous gluconate (FERGON) 324 MG tablet Take 324 mg by mouth every morning. 03/20/19  Yes [provider]  fluconazole (DIFLUCAN) 100 MG tablet Take 100 mg by mouth daily. 04/30/19  Yes [provider]  fluticasone (FLONASE) 50 MCG/ACT nasal spray Place 2 sprays into both nostrils daily as needed for allergies or rhinitis.  09/04/16  Yes [provider]  hydrOXYzine (ATARAX/VISTARIL) 25 MG tablet Take 25 mg by mouth 3 (three) times daily as needed for anxiety or itching.  12/03/18  Yes [provider]  ibuprofen (ADVIL) 600 MG tablet Take 600 mg by mouth every 6 (six) hours as needed for fever, headache or moderate pain.   Yes [provider]  JARDIANCE 10 MG TABS tablet Take 10 mg by mouth daily. 10/30/18  Yes [provider]  meclizine (ANTIVERT) 25 MG tablet Take 25 mg by mouth 3 (three) times daily as needed. 02/27/19  Yes [provider]  Multiple Vitamin tablet Take 1 tablet by mouth daily.   Yes [provider]  nystatin (MYCOSTATIN/NYSTOP) powder Apply 1 g 4 (four) times daily as needed topically (Dry Skin).   Yes [provider]  nystatin cream (MYCOSTATIN) Apply 1 application 2 (two) times daily as needed topically for dry skin.   Yes [provider]  oxyCODONE (OXY IR/ROXICODONE) 5 MG immediate release tablet Take 5 mg by mouth every 8 (eight) hours as needed for severe pain.   Yes [provider]    Probiotic Product (UP4 PROBIOTICS WOMENS PO) Take 1 capsule by mouth daily.    Yes [provider]  promethazine (PHENERGAN) 25 MG tablet Take 1 tablet (25 mg total) every 6 (six) hours as needed by mouth for nausea or vomiting. 11/13/16  Yes McDonald, Mia A, PA-C  sodium chloride (OCEAN) 0.65 % nasal spray Place 1 spray into the nose daily as needed for congestion.  01/20/19 01/20/20 Yes [provider]  SYMBICORT 160-4.5 MCG/ACT inhaler  Inhale 2 puffs into the lungs 2 (two) times daily.  10/30/18  Yes [provider]  tetrahydrozoline 0.05 % ophthalmic solution Place 2 drops into both eyes daily as needed (Dry eyes).   Yes [provider]  TRINTELLIX 10 MG TABS tablet Take 10 mg by mouth daily. 12/03/18  Yes [provider]  meloxicam (MOBIC) 15 MG tablet Take 1 tablet (15 mg total) by mouth daily as needed for pain. Patient not taking: Reported on 09/12/2018 01/17/18   Virgel Manifold, MD  norethindrone (MICRONOR,CAMILA,ERRIN) 0.35 MG tablet Take 1 tablet (0.35 mg total) by mouth daily. Patient not taking: Reported on 01/18/2019 02/09/16   Constant, Peggy, MD  SUMAtriptan (IMITREX) 100 MG tablet Take 1 tablet (100 mg total) by mouth once as needed for migraine. May repeat in 2 hours if headache persists or recurs. Patient not taking: Reported on 05/07/2019 10/23/16   Melvenia Beam, MD  topiramate (TOPAMAX) 100 MG tablet Take 1 tablet (100 mg total) by mouth daily. Patient not taking: Reported on 09/12/2018 10/23/16   Melvenia Beam, MD    Allergies    Cyclobenzaprine, Pregabalin, Shellfish allergy, Amoxicillin-pot clavulanate, Topiramate, and Sumatriptan  Review of Systems   Review of Systems  Constitutional: Positive for fatigue. Negative for chills and fever.  Respiratory: Positive for shortness of breath. Negative for cough.   Cardiovascular: Positive for chest pain.  Gastrointestinal: Positive for abdominal pain and nausea. Negative for  constipation, diarrhea and vomiting.  Genitourinary: Negative for dysuria, frequency, hematuria and urgency.  Neurological: Positive for light-headedness. Negative for syncope.  All other systems reviewed and are negative.   Physical Exam Updated Vital Signs BP (!) 141/71   Pulse 94   Temp 99.1 F (37.3 C) (Oral)   Resp (!) 21   Ht 5\' 9"  (1.753 m)   Wt (!) 170 kg   SpO2 99%   BMI 55.35 kg/m   Physical Exam Vitals and nursing note reviewed.  Constitutional:      General: She is not in acute distress.    Appearance: She is well-developed. She is obese.  HENT:     Head: Normocephalic and atraumatic.  Eyes:     General:        Right eye: No discharge.        Left eye: No discharge.     Conjunctiva/sclera: Conjunctivae normal.  Neck:     Vascular: No JVD.     Trachea: No tracheal deviation.  Cardiovascular:     Rate and Rhythm: Regular rhythm. Tachycardia present.     Pulses:          Radial pulses are 2+ on the right side and 2+ on the left side.       Dorsalis pedis pulses are 2+ on the right side and 2+ on the left side.       Posterior tibial pulses are 2+ on the right side and 2+ on the left side.     Comments: Bevelyn Buckles' sign present bilaterally, no lower extremity edema.  Compartments are soft.  No palpable cords. Pulmonary:     Effort: No tachypnea.     Comments: Speaking in full sentences, SPO2 saturations 99% on room air. Chest:     Chest wall: Tenderness present.     Comments: No deformity, crepitus, ecchymosis or flail segment.  She does have bilateral parasternal chest wall tenderness. Abdominal:     General: There is no distension.     Palpations: Abdomen is soft.  Tenderness: There is abdominal tenderness in the epigastric area and left upper quadrant. There is no guarding or rebound.  Musculoskeletal:     Cervical back: Normal range of motion and neck supple.     Right lower leg: Tenderness present. No edema.     Left lower leg: Tenderness present. No  edema.  Skin:    General: Skin is warm and dry.     Findings: No erythema.  Neurological:     Mental Status: She is alert.  Psychiatric:        Behavior: Behavior normal.     ED Results / Procedures / Treatments   Labs (all labs ordered are listed, but only abnormal results are displayed) Labs Reviewed  BASIC METABOLIC PANEL - Abnormal; Notable for the following components:      Result Value   Chloride 96 (*)    Glucose, Bld 144 (*)    All other components within normal limits  CBC - Abnormal; Notable for the following components:   WBC 18.7 (*)    RBC 5.25 (*)    MCH 24.8 (*)    MCHC 29.8 (*)    RDW 17.1 (*)    Platelets 555 (*)    All other components within normal limits  URINALYSIS, ROUTINE W REFLEX MICROSCOPIC - Abnormal; Notable for the following components:   APPearance HAZY (*)    Glucose, UA >=500 (*)    Protein, ur 30 (*)    Bacteria, UA RARE (*)    All other components within normal limits  HEPATIC FUNCTION PANEL - Abnormal; Notable for the following components:   Total Protein 8.3 (*)    ALT 49 (*)    All other components within normal limits  D-DIMER, QUANTITATIVE (NOT AT [facility]) - Abnormal; Notable for the following components:   D-Dimer, Quant 0.77 (*)    All other components within normal limits  BRAIN NATRIURETIC PEPTIDE  LIPASE, BLOOD  I-STAT BETA HCG BLOOD, ED ([facility], [facility], [facility] ONLY)  TROPONIN I (HIGH SENSITIVITY)  TROPONIN I (HIGH SENSITIVITY)    EKG EKG Interpretation  Date/Time:  Tuesday May 06 2019 17:59:20 EDT Ventricular Rate:  104 PR Interval:  134 QRS Duration: 82 QT Interval:  380 QTC Calculation: 499 R Axis:   -18 Text Interpretation: Sinus tachycardia Otherwise normal ECG Confirmed by Thayer Jew [phone]) on 05/07/2019 4:12:13 AM   Radiology DG Chest 2 View  Result Date: 05/06/2019 CLINICAL DATA:  Chest pain EXAM: CHEST - 2 VIEW COMPARISON:  01/18/2019 FINDINGS: The heart size and mediastinal contours are within normal limits.  Both lungs are clear. The visualized skeletal structures are unremarkable. IMPRESSION: No active cardiopulmonary disease. Electronically Signed   By: Donavan Foil M.D.   On: 05/06/2019 18:57   CT Angio Chest PE W and/or Wo Contrast  Result Date: 05/07/2019 CLINICAL DATA:  Chest pain and nausea EXAM: CT ANGIOGRAPHY CHEST WITH CONTRAST TECHNIQUE: Multidetector CT imaging of the chest was performed using the standard protocol during bolus administration of intravenous contrast. Multiplanar CT image reconstructions and MIPs were obtained to evaluate the vascular anatomy. CONTRAST:  166mL OMNIPAQUE IOHEXOL 300 MG/ML  SOLN COMPARISON:  None. FINDINGS: Cardiovascular: There is a optimal opacification of the pulmonary arteries. There is no central,segmental, or subsegmental filling defects within the pulmonary arteries. The heart is normal in size. No pericardial effusion or thickening. No evidence right heart strain. There is normal three-vessel brachiocephalic anatomy without proximal stenosis. The thoracic aorta is normal in appearance. Mediastinum/Nodes: No hilar, mediastinal,  or axillary adenopathy. Thyroid gland, trachea, and esophagus demonstrate no significant findings. Lungs/Pleura: Minimal bibasilar atelectasis. No pleural effusion or pneumothorax. No airspace consolidation. Upper Abdomen: No acute abnormalities present in the visualized portions of the upper abdomen. Musculoskeletal: No chest wall abnormality. No acute or significant osseous findings. Review of the MIP images confirms the above findings. Abdomen/pelvis: Hepatobiliary: The liver is normal in density without focal abnormality.The main portal vein is patent. The patient is status post cholecystectomy. No biliary ductal dilation. Pancreas: Unremarkable. No pancreatic ductal dilatation or surrounding inflammatory changes. Spleen: Normal in size without focal abnormality. Adrenals/Urinary Tract: Both adrenal glands appear normal. The kidneys and  collecting system appear normal without evidence of urinary tract calculus or hydronephrosis. Bladder is unremarkable. Stomach/Bowel: The stomach, small bowel, and colon are normal in appearance. No inflammatory changes, wall thickening, or obstructive findings.The appendix is normal. Vascular/Lymphatic: There are no enlarged mesenteric, retroperitoneal, or pelvic lymph nodes. No significant vascular findings are present. Reproductive: There is a probable posterior right uterine fibroid present. Other: Again noted within the right lower anterior abdominal wall a soft tissue nodule which measures 2.4 cm in maximum diameter and is unchanged from prior exam dating back to 2018. Musculoskeletal: No acute or significant osseous findings. IMPRESSION: No central, segmental, or subsegmental pulmonary embolism. No acute intrathoracic, abdominal, or pelvic pathology to explain the patient's symptoms. Unchanged 2.5 cm soft tissue nodule along the right lower anterior abdominal wall which could represent a scar endometriosis versus desmoid dating back to 2018. Electronically Signed   By: Prudencio Pair M.D.   On: 05/07/2019 04:05   CT ABDOMEN PELVIS W CONTRAST  Result Date: 05/07/2019 CLINICAL DATA:  Chest pain and nausea EXAM: CT ANGIOGRAPHY CHEST WITH CONTRAST TECHNIQUE: Multidetector CT imaging of the chest was performed using the standard protocol during bolus administration of intravenous contrast. Multiplanar CT image reconstructions and MIPs were obtained to evaluate the vascular anatomy. CONTRAST:  15mL OMNIPAQUE IOHEXOL 300 MG/ML  SOLN COMPARISON:  None. FINDINGS: Cardiovascular: There is a optimal opacification of the pulmonary arteries. There is no central,segmental, or subsegmental filling defects within the pulmonary arteries. The heart is normal in size. No pericardial effusion or thickening. No evidence right heart strain. There is normal three-vessel brachiocephalic anatomy without proximal stenosis. The  thoracic aorta is normal in appearance. Mediastinum/Nodes: No hilar, mediastinal, or axillary adenopathy. Thyroid gland, trachea, and esophagus demonstrate no significant findings. Lungs/Pleura: Minimal bibasilar atelectasis. No pleural effusion or pneumothorax. No airspace consolidation. Upper Abdomen: No acute abnormalities present in the visualized portions of the upper abdomen. Musculoskeletal: No chest wall abnormality. No acute or significant osseous findings. Review of the MIP images confirms the above findings. Abdomen/pelvis: Hepatobiliary: The liver is normal in density without focal abnormality.The main portal vein is patent. The patient is status post cholecystectomy. No biliary ductal dilation. Pancreas: Unremarkable. No pancreatic ductal dilatation or surrounding inflammatory changes. Spleen: Normal in size without focal abnormality. Adrenals/Urinary Tract: Both adrenal glands appear normal. The kidneys and collecting system appear normal without evidence of urinary tract calculus or hydronephrosis. Bladder is unremarkable. Stomach/Bowel: The stomach, small bowel, and colon are normal in appearance. No inflammatory changes, wall thickening, or obstructive findings.The appendix is normal. Vascular/Lymphatic: There are no enlarged mesenteric, retroperitoneal, or pelvic lymph nodes. No significant vascular findings are present. Reproductive: There is a probable posterior right uterine fibroid present. Other: Again noted within the right lower anterior abdominal wall a soft tissue nodule which measures 2.4 cm in maximum diameter and is unchanged  from prior exam dating back to 2018. Musculoskeletal: No acute or significant osseous findings. IMPRESSION: No central, segmental, or subsegmental pulmonary embolism. No acute intrathoracic, abdominal, or pelvic pathology to explain the patient's symptoms. Unchanged 2.5 cm soft tissue nodule along the right lower anterior abdominal wall which could represent a scar  endometriosis versus desmoid dating back to 2018. Electronically Signed   By: Prudencio Pair M.D.   On: 05/07/2019 04:05    Procedures Procedures (including critical care time)  Medications Ordered in ED Medications  iohexol (OMNIPAQUE) 300 MG/ML solution 100 mL (100 mLs Intravenous Contrast Given 05/07/19 0322)    ED Course  I have reviewed the triage vital signs and the nursing notes.  Pertinent labs & imaging results that were available during my care of the patient were reviewed by me and considered in my medical decision making (see chart for details).    MDM Rules/Calculators/A&P                      Patient presenting for evaluation of progressively worsening dyspnea on exertion, atypical chest pains for 2 weeks.  Also mentions left-sided abdominal pains for 1 week intermittently.  She is afebrile, mildly tachycardic on initial assessment with improvement on subsequent reevaluations.  Vital signs otherwise stable.  He is nontoxic in appearance.  On physical examination her abdomen is soft with no rebound or guarding but she does have some left upper quadrant and epigastric tenderness.  Lungs are clear to auscultation bilaterally though somewhat limited due to body habitus.  She does not appear to be in any respiratory distress.  She was ambulated in the ED with stable SPO2 saturations, dropped as low as 93% on room air with ambulation.  She had Covid earlier this year so theoretically has immunity to this at this time.    Chest x-ray shows no acute cardiopulmonary abnormalities with no edema, consolidation or pneumothorax.  EKG shows sinus tachycardia, no acute ischemic abnormalities and serial troponins are negative.  Have a low suspicion of ACS/MI at this time.  She does have an elevated D-dimer so we will proceed with PE study and CT scan of the abdomen and pelvis to rule out acute abnormality.  Remainder of lab work reviewed by me shows nonspecific leukocytosis (which patient tells  me is chronic), no anemia, no renal insufficiency or metabolic derangements.  Her lipase and LFTs are reassuring.  UA shows glucosuria but no concern for UTI or nephrolithiasis.  Imaging today shows no evidence of PE, pericardial effusion, dissection, esophageal rupture, or acute surgical abdominal pathology.  On reevaluation patient is resting comfortably in no apparent distress.  Her heart rate has improved.  Orthostatic vital signs were obtained and she is not orthostatic.  Suspect possible obesity hypoventilation syndrome, morbid obesity contributing to physical deconditioning.  She is in the process of following up with her OSA specialist to determine need for CPAP and supplemental oxygen.  I encouraged her to follow-up with her specialist and her PCP.  We also discussed that some of her chest pains could be related to musculoskeletal chest wall pain and encouraged her to use NSAIDs, Tylenol as needed for symptoms.  Discussed strict ED return precautions. Patient verbalized understanding of and agreement with plan and is safe for discharge home at this time.  She has no complaints prior to discharge.  Discussed work-up and plan of care with Dr. Dina Rich who agrees with assessment and plan at this time.   Final Clinical Impression(s) /  ED Diagnoses Final diagnoses:  Atypical chest pain  DOE (dyspnea on exertion)    Rx / DC Orders ED Discharge Orders    None       Debroah Baller 05/07/19 0454    Merryl Hacker, MD 05/07/19 [phone]

## 2019-05-07 NOTE — ED Notes (Signed)
Pt ambulated to the restroom with a steady gait while holding herself up on the dinemap. Pt O2 at 96% at beginning of walk  Then dropped to 93%

## 2019-05-07 NOTE — ED Notes (Signed)
Pt verbalized understanding of d/c instructions, follow up care, and s/s requiring return to ed. Pt had no additional questions at this time and was transported  To POV via wheelchair.

## 2019-05-07 NOTE — Discharge Instructions (Signed)
Your work-up today was reassuring.  Continue to take all of your home medications as prescribed.  You can take 1 to 2 tablets of Tylenol (350mg -1000mg  depending on the dose) every 6 hours as needed for pain.  Do not exceed 4000 mg of Tylenol daily.  If your pain persists you can take a dose of ibuprofen in between doses of Tylenol.  I usually recommend 400 to 600 mg of ibuprofen every 6 hours.  Take this with food to avoid upset stomach issues.  Call your primary care provider and your sleep apnea specialist to schedule follow-up and for further recommendations.  Return to the emergency department if any concerning signs or symptoms develop such as high fevers, persistent chest pain or shortness of breath, loss of consciousness or persistent vomiting.

## 2019-05-07 NOTE — ED Notes (Signed)
Sent a urine culture with the urine specimen 

## 2019-05-21 ENCOUNTER — Encounter: Payer: Self-pay | Admitting: Pulmonary Disease

## 2019-05-21 ENCOUNTER — Other Ambulatory Visit: Payer: Self-pay

## 2019-05-21 ENCOUNTER — Ambulatory Visit (INDEPENDENT_AMBULATORY_CARE_PROVIDER_SITE_OTHER): Payer: Medicaid Other | Admitting: Pulmonary Disease

## 2019-05-21 VITALS — BP 138/74 | HR 97 | Temp 97.5°F | Ht 69.0 in | Wt 347.0 lb

## 2019-05-21 DIAGNOSIS — R0600 Dyspnea, unspecified: Secondary | ICD-10-CM

## 2019-05-21 DIAGNOSIS — R0609 Other forms of dyspnea: Secondary | ICD-10-CM

## 2019-05-21 NOTE — Progress Notes (Signed)
Jennifer Dickson    [id number]    [birth year]  Primary Care Physician:Bailey, Holli Humbles, FNP  Referring Physician: Mindi Curling, PA-C [place],  [place] [id number]  Chief complaint: Consult for dyspnea   HPI: 44 year old with diabetes mellitus, fibromyalgia, hypertension, morbid obesity Complains of dyspnea on exertion for the past 1 to 2 years. Associated with wheezing.  No cough, sputum production, fevers  She had a cardiac work-up at [facility] for dyspnea, palpitation including normal echocardiogram, heart catheterization.  Evaluated by ED on 05/07/2019 with normal BNP, troponin.  She got a CTA for elevated D-dimer however there were no PE or lung abnormalities.  [facility] currently on Symbicort but has not noted any difference with her breathing.  Recently started on Singulair on 05/14/2019.   Pets: Has a dog Occupation: Used to work as a Psychologist, counselling.  Currently on disability Exposures: No known exposures.  No mold, hot tub, Jacuzzi.  No down pillows or comforters Smoking history: Minimal smoking history.  Quit in 2000 Travel history: No significant travel history Relevant family history: Sister and mother had asthma.  Brother and mother had unspecified clotting disorder.  Outpatient Encounter Medications as of 05/21/2019  Medication Sig  . albuterol (VENTOLIN HFA) 108 (90 Base) MCG/ACT inhaler Inhale 1 puff into the lungs every 4 (four) hours as needed.  Marland Kitchen amLODipine (NORVASC) 5 MG tablet Take 5 mg by mouth daily.  Marland Kitchen atorvastatin (LIPITOR) 20 MG tablet Take 1 tablet by mouth daily.  . baclofen (LIORESAL) 10 MG tablet Take 10 mg by mouth every 8 (eight) hours as needed for muscle spasms.  . carbamide peroxide (DEBROX) 6.5 % OTIC solution Place 5 drops into both ears daily as needed (Ear wax removal).  . cetirizine (ZYRTEC) 10 MG tablet Take 10 mg by mouth daily.  . chlorthalidone (HYGROTON) 25 MG  tablet Take 25 mg by mouth daily.  . Cholecalciferol (VITAMIN D3 PO) Take 1 tablet by mouth daily.  . diclofenac Sodium (VOLTAREN) 1 % GEL Apply 2 g topically 2 (two) times daily as needed (Joint pain).  Marland Kitchen esomeprazole (NEXIUM) 40 MG capsule Take 40 mg by mouth daily.  . ferrous gluconate (FERGON) 324 MG tablet Take 324 mg by mouth every morning.  . fluconazole (DIFLUCAN) 100 MG tablet Take 100 mg by mouth daily.  . fluticasone (FLONASE) 50 MCG/ACT nasal spray Place 2 sprays into both nostrils daily as needed for allergies or rhinitis.   . hydrOXYzine (ATARAX/VISTARIL) 25 MG tablet Take 25 mg by mouth 3 (three) times daily as needed for anxiety or itching.   Marland Kitchen ibuprofen (ADVIL) 600 MG tablet Take 600 mg by mouth every 6 (six) hours as needed for fever, headache or moderate pain.  Marland Kitchen JARDIANCE 10 MG TABS tablet Take 10 mg by mouth daily.  . meclizine (ANTIVERT) 25 MG tablet Take 25 mg by mouth 3 (three) times daily as needed.  . montelukast (SINGULAIR) 10 MG tablet Take by mouth.  . Multiple Vitamin tablet Take 1 tablet by mouth daily.  Marland Kitchen nystatin (MYCOSTATIN/NYSTOP) powder Apply 1 g 4 (four) times daily as needed topically (Dry Skin).  . nystatin cream (MYCOSTATIN) Apply 1 application 2 (two) times daily as needed topically for dry skin.  Marland Kitchen oxyCODONE (OXY IR/ROXICODONE) 5 MG immediate release tablet Take 5 mg by mouth every 8 (eight) hours as needed for severe pain.  . Probiotic Product (UP4  PROBIOTICS WOMENS PO) Take 1 capsule by mouth daily.   . promethazine (PHENERGAN) 25 MG tablet Take 1 tablet (25 mg total) every 6 (six) hours as needed by mouth for nausea or vomiting.  . sodium chloride (OCEAN) 0.65 % nasal spray Place 1 spray into the nose daily as needed for congestion.   . SUMAtriptan (IMITREX) 100 MG tablet Take 1 tablet (100 mg total) by mouth once as needed for migraine. May repeat in 2 hours if headache persists or recurs.  . SYMBICORT 160-4.5 MCG/ACT inhaler Inhale 2 puffs into the  lungs 2 (two) times daily.   Marland Kitchen tetrahydrozoline 0.05 % ophthalmic solution Place 2 drops into both eyes daily as needed (Dry eyes).  . TRINTELLIX 10 MG TABS tablet Take 10 mg by mouth daily.  . buprenorphine (BUTRANS) 10 MCG/HR PTWK Place onto the skin.  . [DISCONTINUED] meloxicam (MOBIC) 15 MG tablet Take 1 tablet (15 mg total) by mouth daily as needed for pain. (Patient not taking: Reported on 09/12/2018)  . [DISCONTINUED] norethindrone (MICRONOR,CAMILA,ERRIN) 0.35 MG tablet Take 1 tablet (0.35 mg total) by mouth daily. (Patient not taking: Reported on 01/18/2019)  . [DISCONTINUED] topiramate (TOPAMAX) 100 MG tablet Take 1 tablet (100 mg total) by mouth daily. (Patient not taking: Reported on 09/12/2018)   No facility-administered encounter medications on file as of 05/21/2019.    Allergies as of 05/21/2019 - Review Complete 05/21/2019  Allergen Reaction Noted  . Cyclobenzaprine Rash and Other (See Comments) 10/09/2014  . Pregabalin Other (See Comments) 04/23/2019  . Shellfish allergy Anaphylaxis 10/09/2014  . Amoxicillin-pot clavulanate Rash 10/09/2014  . Topiramate Other (See Comments) 02/03/2019  . Sumatriptan Palpitations 09/09/2016    Past Medical History:  Diagnosis Date  . Cervical radiculopathy   . COVID-19   . Diabetes mellitus without complication ([place])   . Diverticulosis   . DVT (deep vein thrombosis) in pregnancy   . Endometrioma   . Fibromyalgia   . High blood pressure   . IBS (irritable bowel syndrome)   . Interstitial cystitis   . Osteoarthritis   . Tendonitis     Past Surgical History:  Procedure Laterality Date  . CESAREAN SECTION    . CHOLECYSTECTOMY  1996    Family History  Problem Relation Age of Onset  . Diabetes Mother   . Cancer Mother   . Heart disease Mother   . Heart disease Brother   . Cancer Sister        lung  . Cancer Sister        brain  . Migraines Neg Hx     Social History   Socioeconomic History  . Marital status: Divorced     Spouse name: Not on file  . Number of children: 4  . Years of education: Some college  . Highest education level: Not on file  Occupational History  . Occupation: N/A  Tobacco Use  . Smoking status: Former Smoker    Years: 5.00    Quit date: 2007    Years since quitting: 14.3  . Smokeless tobacco: Never Used  Substance and Sexual Activity  . Alcohol use: No    Comment: None since 2013  . Drug use: No  . Sexual activity: Never  Other Topics Concern  . Not on file  Social History Narrative   Lives at home w/ 3 of her children   Right-handed   Caffeine: 1-2 cups of coffee, 4 teas/sodas per day   Social Determinants of Radio broadcast assistant  Strain:   . Difficulty of Paying Living Expenses:   Food Insecurity:   . Worried About Charity fundraiser in the Last Year:   . Arboriculturist in the Last Year:   Transportation Needs:   . Film/video editor (Medical):   Marland Kitchen Lack of Transportation (Non-Medical):   Physical Activity:   . Days of Exercise per Week:   . Minutes of Exercise per Session:   Stress:   . Feeling of Stress :   Social Connections:   . Frequency of Communication with Friends and Family:   . Frequency of Social Gatherings with Friends and Family:   . Attends Religious Services:   . Active Member of Clubs or Organizations:   . Attends Archivist Meetings:   Marland Kitchen Marital Status:   Intimate Partner Violence:   . Fear of Current or Ex-Partner:   . Emotionally Abused:   Marland Kitchen Physically Abused:   . Sexually Abused:     Review of systems: Review of Systems  Constitutional: Negative for fever and chills.  HENT: Negative.   Eyes: Negative for blurred vision.  Respiratory: as per HPI  Cardiovascular: Negative for chest pain and palpitations.  Gastrointestinal: Negative for vomiting, diarrhea, blood per rectum. Genitourinary: Negative for dysuria, urgency, frequency and hematuria.  Musculoskeletal: Negative for myalgias, back pain and joint pain.    Skin: Negative for itching and rash.  Neurological: Negative for dizziness, tremors, focal weakness, seizures and loss of consciousness.  Endo/Heme/Allergies: Negative for environmental allergies.  Psychiatric/Behavioral: Negative for depression, suicidal ideas and hallucinations.  All other systems reviewed and are negative.  Physical Exam: Blood pressure 138/74, pulse 97, temperature (!) 97.5 F (36.4 C), temperature source Temporal, height 5\' 9"  (1.753 m), weight (!) 347 lb (157.4 kg), SpO2 98 %. Gen:      No acute distress HEENT:  EOMI, sclera anicteric Neck:     No masses; no thyromegaly Lungs:    Clear to auscultation bilaterally; normal respiratory effort CV:         Regular rate and rhythm; no murmurs Abd:      + bowel sounds; soft, non-tender; no palpable masses, no distension Ext:    No edema; adequate peripheral perfusion Skin:      Warm and dry; no rash Neuro: alert and oriented x 3 Psych: normal mood and affect  Data Reviewed: Imaging: CTA 05/07/2019-no pulmonary embolism, minimal basal atelectasis. I have reviewed the images personally.  PFTs: Pending  Labs: CBC 01/18/2019-WBC 6.7, eos 0%  Work up at [facility] Coronary angiogram 11/2018. No significant obstructive coronary disease. .  Echocardiogram 05/2018. Normal LV EF 60-65% with normal wall motion. Normal wall thickness. Normal diastolic function. No valvular heart disease.   Lexiscan Cardiolite SPECT study 07/2018.  1. Pharmacologic stress protocol performed with Lexiscan due to poor exercise tolerance. 2. Normal left ventricular systolic function, LV ejection fraction 59%. No regional wall motion abnormality. 3. Normal Cardiolite SPECT study. Medium size perfusion abnormality in the anterior and septal region. Probable artifact due to breast attenuation.  CT chest angio 10/2018. 1. No explanation for patient's persistent chest pain and shortness  of breath. Specifically, no evidence of pulmonary embolism.   2. Borderline cardiomegaly. Further evaluation cardiac echo could be  performed as indicated.  3. Suspected hepatic steatosis with mild nodularity hepatic contour  as could be seen in the setting of early cirrhotic change.  Correlation with LFTs is advised.   Assessment:  Consult for dyspnea She has a  diagnosis of asthma.  Currently on Symbicort Schedule pulmonary function test for further evaluation Cardiac work-up as noted above which is normal I have reviewed her CTA with no significant interstitial lung abnormality.  Suspect that body habitus and obesity is playing a significant role She continues on her weight loss program.  Suspected OSA/OHS Has follow-up with sleep medicine at [facility] already scheduled.  Plan/Recommendations: Continue Symbicort Schedule pulmonary function test  Marshell Garfinkel MD [facility] 05/21/2019, 3:54 PM  CC: Mindi Curling, PA-C

## 2019-05-21 NOTE — Patient Instructions (Signed)
We will schedule you for pulmonary function test Follow-up after test to review Please continue to work on losing weight with diet and exercise

## 2019-05-22 ENCOUNTER — Ambulatory Visit: Payer: Medicaid Other | Attending: Internal Medicine

## 2019-05-22 DIAGNOSIS — Z23 Encounter for immunization: Secondary | ICD-10-CM

## 2019-05-22 NOTE — Progress Notes (Signed)
   Covid-19 Vaccination Clinic  Name:  Jennifer Dickson    MRN: [id number] DOB: 06-27-1975  05/22/2019  Jennifer Dickson was observed post Covid-19 immunization for 15 minutes without incident. She was provided with Vaccine Information Sheet and instruction to access the V-Safe system.   Jennifer Dickson was instructed to call 911 with any severe reactions post vaccine: Marland Kitchen Difficulty breathing  . Swelling of face and throat  . A fast heartbeat  . A bad rash all over body  . Dizziness and weakness   Immunizations Administered    Name Date Dose VIS Date Route   Pfizer COVID-19 Vaccine 05/22/2019  3:01 PM 0.3 mL 03/05/2018 Intramuscular   Manufacturer: [place]   Lot: [id number]   [place]: [id number]

## 2019-06-16 ENCOUNTER — Ambulatory Visit: Payer: Medicaid Other | Attending: Internal Medicine

## 2019-06-16 DIAGNOSIS — Z23 Encounter for immunization: Secondary | ICD-10-CM

## 2019-06-16 NOTE — Progress Notes (Signed)
   Covid-19 Vaccination Clinic  Name:  Jennifer Dickson    MRN: [id number] DOB: [birth year]  06/16/2019  Jennifer Dickson was observed post Covid-19 immunization for 30 minutes based on pre-vaccination screening without incident. She was provided with Vaccine Information Sheet and instruction to access the V-Safe system.   Jennifer Dickson was instructed to call 911 with any severe reactions post vaccine: Marland Kitchen Difficulty breathing  . Swelling of face and throat  . A fast heartbeat  . A bad rash all over body  . Dizziness and weakness   Immunizations Administered    Name Date Dose VIS Date Route   Pfizer COVID-19 Vaccine 06/16/2019  2:51 PM 0.3 mL 03/05/2018 Intramuscular   Manufacturer: [employer]   Lot: [id number]   [place]: [address]-5

## 2019-07-04 ENCOUNTER — Other Ambulatory Visit (HOSPITAL_COMMUNITY): Payer: Medicaid Other

## 2019-08-06 ENCOUNTER — Ambulatory Visit: Payer: Medicaid Other

## 2019-08-19 ENCOUNTER — Ambulatory Visit: Payer: Medicaid Other

## 2019-08-28 ENCOUNTER — Ambulatory Visit: Payer: Medicaid Other | Attending: Physician Assistant

## 2019-08-28 ENCOUNTER — Other Ambulatory Visit: Payer: Self-pay

## 2019-08-28 DIAGNOSIS — R293 Abnormal posture: Secondary | ICD-10-CM | POA: Diagnosis present

## 2019-08-28 DIAGNOSIS — M654 Radial styloid tenosynovitis [de Quervain]: Secondary | ICD-10-CM

## 2019-08-28 DIAGNOSIS — M25531 Pain in right wrist: Secondary | ICD-10-CM | POA: Diagnosis not present

## 2019-08-28 DIAGNOSIS — M6281 Muscle weakness (generalized): Secondary | ICD-10-CM

## 2019-08-28 NOTE — Therapy (Signed)
[place], [place], [address] Phone: [phone]   Fax:  [phone]  Physical Therapy Evaluation  Patient Details  Name: Jennifer Dickson MRN: [id number] Date of Birth: 11-Jun-1975 Referring Provider (PT): Windell Hummingbird, PA-C   Encounter Date: 08/28/2019   PT End of Session - 08/28/19 1732    Visit Number 1    Number of Visits 4    Date for PT Re-Evaluation 09/25/19    Authorization Type Medicaid    PT Start Time 1702    PT Stop Time 1745    PT Time Calculation (min) 43 min    Activity Tolerance Patient tolerated treatment well;Patient limited by pain    Behavior During Therapy [facility] for tasks assessed/performed           Past Medical History:  Diagnosis Date  . Cervical radiculopathy   . COVID-19   . Diabetes mellitus without complication ([place])   . Diverticulosis   . DVT (deep vein thrombosis) in pregnancy   . Endometrioma   . Fibromyalgia   . High blood pressure   . IBS (irritable bowel syndrome)   . Interstitial cystitis   . Osteoarthritis   . Tendonitis     Past Surgical History:  Procedure Laterality Date  . CESAREAN SECTION    . CHOLECYSTECTOMY  1996    There were no vitals filed for this visit.    Subjective Assessment - 08/28/19 1500    Subjective Pt reports a 55 inch tv fell on her. She was pulling the tv forward to adjust the cord and it felt on her R wrist bending it back, on 06/27/19. She reports her pain is staying the same, not getting better or worse. She wears a wrist brace.    Limitations House hold activities;Lifting;Writing    Currently in Pain? Yes    Pain Score 7     Pain Location Wrist    Pain Orientation Right   to thumb   Pain Descriptors / Indicators Throbbing    Pain Type Chronic pain    Pain Radiating Towards wrist to thumb    Pain Onset More than a month ago    Pain Frequency Intermittent    Aggravating Factors  lifting, writing, R S/L, gripping     Pain Relieving Factors in some ways the brace, heat, self stretching and massage              [facility] PT Assessment - 08/28/19 0001      Assessment   Medical Diagnosis R Wrist Pain, R Radial Styloid Tenosynovitis    Referring Provider (PT) Windell Hummingbird, PA-C    Onset Date/Surgical Date 06/27/19    Hand Dominance Right    Next MD Visit None scheduled    Prior Therapy R shoulder 2020      Balance Screen   Has the patient fallen in the past 6 months No    Has the patient had a decrease in activity level because of a fear of falling?  Yes   spinal issues, balance   Is the patient reluctant to leave their home because of a fear of falling?  No      Prior Function   Vocation --   looking for employment   Leisure Decorating, writing, exercise      ROM / Strength   AROM / PROM / Strength AROM;PROM;Strength      AROM   AROM Assessment Site Wrist    Right/Left Wrist Right  Right Wrist Extension 50 Degrees   L 55   Right Wrist Flexion 48 Degrees   L 55   Right Wrist Radial Deviation 25 Degrees   L 30   Right Wrist Ulnar Deviation 32 Degrees   painful in 1st tunnel "pulling"     PROM   Overall PROM Comments painful beyond active motion      Strength   Strength Assessment Site Elbow;Forearm;Wrist;Hand    Right/Left Elbow Right;Left    Right Elbow Flexion 3+/5    Right Elbow Extension 3+/5    Left Elbow Flexion 4/5    Left Elbow Extension 4/5    Right/Left Forearm Right;Left    Right Forearm Pronation 3+/5    Right Forearm Supination 3+/5    Left Forearm Pronation 4/5    Left Forearm Supination 4/5    Right/Left Wrist Right;Left    Right Wrist Flexion 3+/5   pain   Right Wrist Extension 3+/5   pain   Right Wrist Radial Deviation 3+/5   pain   Right Wrist Ulnar Deviation 3+/5   pain   Left Wrist Flexion 4+/5    Left Wrist Extension 4+/5    Left Wrist Radial Deviation 4+/5    Left Wrist Ulnar Deviation 4+/5    Right/Left hand Right;Left    Right Hand Grip (lbs) 20.3   22,  17, 22   Left Hand Grip (lbs) 58   72, 57, 45                     Objective measurements completed on examination: See above findings.               PT Education - 08/28/19 1732    Education Details Jennifer Dickson, splint, HEP, POC    Person(s) Educated Patient    Methods Explanation;Demonstration;Tactile cues;Verbal cues;Handout    Comprehension Verbalized understanding;Tactile cues required;Returned demonstration;Need further instruction;Verbal cues required            PT Short Term Goals - 08/28/19 1742      PT SHORT TERM GOAL #1   Title Pt will be independent and compliant with initial HEP.    Time 2    Period Weeks    Status New    Target Date 09/11/19      PT SHORT TERM GOAL #2   Title Pt will decrease pain by 25% with activity.    Baseline 7/10    Time 3    Period Weeks    Status New    Target Date 09/18/19      PT SHORT TERM GOAL #3   Title Pt will increase grip strength in left hand to >30 lbs (average of 3 trials).    Baseline 20 lbs    Time 3    Period Weeks    Status New    Target Date 09/18/19             PT Long Term Goals - 08/28/19 1743      PT LONG TERM GOAL #1   Title Pt will decrease pain in wrist and thumb to <2/10 with functional activity and ADLs.    Time 8    Period Weeks    Status New    Target Date 10/23/19      PT LONG TERM GOAL #2   Title Pt will increase R wrist and forearm MMT to at least 4/5, in order to tolerate IADLs and increase activity level.    Time 8  Period Weeks    Status New    Target Date 10/23/19      PT LONG TERM GOAL #3   Title Pt will increase R grip strength to >50 lbs for improved symmetry with nondominant L hand.    Time 8    Period Weeks    Status New    Target Date 10/23/19                  Plan - 08/28/19 1733    Clinical Impression Statement Pt is a 44 yo female with onset of R wrist and radial styloid pain 2 mo ago when tv fell on her wrist. Pt has since  decreased activity secondary to pain, intermittently wearing R wrist splint. She demonstrates impairments in grip strength (dominant R-hand) by >30 lbs L to R, pain with all wrist motion greatest into ulnar deviation and wrist flexion, wrist/elbow weakness via MMT, and limited functional ability with R wrist. Pt attended PT 1 year ago for R shoulder pain and weakness that is still limiting and likely affecting her wrist as well. She was educated on diagnosis, prognosis, POC, HEP, and wearing splint. She verbalized understanding and consent to tx. She will benefit from skilled PT 2x/week for 6-8 weeks to address impairments and maximize functional potential.    Personal Factors and Comorbidities Fitness;Comorbidity 3+;Past/Current Experience;Time since onset of injury/illness/exacerbation;Transportation    Comorbidities DM, HTN, arthritis    Examination-Activity Limitations Carry;Other;Lift   grip, grasp   Examination-Participation Restrictions Occupation;Shop;Community Activity    Stability/Clinical Decision Making Stable/Uncomplicated    Clinical Decision Making Low    PT Frequency 1x / week   1x/week 1st 3 weeks per Medicaid Authorization   PT Duration 8 weeks    PT Treatment/Interventions ADLs/Self Care Home Management;Cryotherapy;Moist Heat;Therapeutic activities;Neuromuscular re-education;Therapeutic exercise;Patient/family education;Manual techniques;Passive range of motion;Dry needling;Splinting;Taping;Joint Manipulations    PT Next Visit Plan Assess response to HEP, modalities PRN for pain, manual as indicated to address joint restriction/ROM limitation    PT Home Exercise Plan [id number]: Wrist AROM all planes, [employer]king    Consulted and Agree with Plan of Care Patient           Patient will benefit from skilled therapeutic intervention in order to improve the following deficits and impairments:  Obesity, Improper body mechanics, Impaired perceived functional ability, Increased  edema, Decreased range of motion, Decreased activity tolerance, Decreased strength, Increased fascial restricitons, Postural dysfunction, Impaired UE functional use, Pain  Visit Diagnosis: Pain in right wrist - Plan: PT plan of care cert/re-cert  Radial styloid tenosynovitis (de quervain) - Plan: PT plan of care cert/re-cert  Abnormal posture - Plan: PT plan of care cert/re-cert  Muscle weakness (generalized) - Plan: PT plan of care cert/re-cert     Problem List Patient Active Problem List   Diagnosis Date Noted  . Obesity, morbid, BMI 50 or higher ([place]) 06/16/2015  . Abdominal wall mass of suprapubic region 06/16/2015    Izell Clarendon, PT, DPT 08/28/2019, 5:47 PM  [facility] [address] [place], [place], [address] Phone: [phone]   Fax:  [phone]  Name: Jennifer Dickson MRN: [id number] Date of Birth: 28-Jun-1975

## 2019-09-04 ENCOUNTER — Ambulatory Visit: Payer: Medicaid Other | Admitting: Physical Therapy

## 2019-09-08 ENCOUNTER — Ambulatory Visit (INDEPENDENT_AMBULATORY_CARE_PROVIDER_SITE_OTHER): Payer: Medicaid Other | Admitting: Pulmonary Disease

## 2019-09-08 ENCOUNTER — Encounter: Payer: Self-pay | Admitting: Pulmonary Disease

## 2019-09-08 ENCOUNTER — Other Ambulatory Visit: Payer: Self-pay

## 2019-09-08 VITALS — BP 110/60 | HR 94 | Temp 98.1°F | Ht 69.0 in | Wt 341.0 lb

## 2019-09-08 DIAGNOSIS — R0600 Dyspnea, unspecified: Secondary | ICD-10-CM | POA: Diagnosis not present

## 2019-09-08 DIAGNOSIS — R0609 Other forms of dyspnea: Secondary | ICD-10-CM

## 2019-09-08 LAB — PULMONARY FUNCTION TEST
DL/VA % pred: 160 %
DL/VA: 6.75 ml/min/mmHg/L
DLCO cor % pred: 135 %
DLCO cor: 34.16 ml/min/mmHg
DLCO unc % pred: 135 %
DLCO unc: 34.16 ml/min/mmHg
FEF 25-75 Post: 2.16 L/sec
FEF 25-75 Pre: 1.97 L/sec
FEF2575-%Change-Post: 9 %
FEF2575-%Pred-Post: 71 %
FEF2575-%Pred-Pre: 65 %
FEV1-%Change-Post: 1 %
FEV1-%Pred-Post: 77 %
FEV1-%Pred-Pre: 76 %
FEV1-Post: 2.25 L
FEV1-Pre: 2.23 L
FEV1FVC-%Change-Post: 0 %
FEV1FVC-%Pred-Pre: 97 %
FEV6-%Change-Post: 2 %
FEV6-%Pred-Post: 80 %
FEV6-%Pred-Pre: 78 %
FEV6-Post: 2.81 L
FEV6-Pre: 2.76 L
FEV6FVC-%Pred-Post: 102 %
FEV6FVC-%Pred-Pre: 102 %
FVC-%Change-Post: 2 %
FVC-%Pred-Post: 78 %
FVC-%Pred-Pre: 76 %
FVC-Post: 2.81 L
FVC-Pre: 2.76 L
Post FEV1/FVC ratio: 80 %
Post FEV6/FVC ratio: 100 %
Pre FEV1/FVC ratio: 81 %
Pre FEV6/FVC Ratio: 100 %
RV % pred: 99 %
RV: 1.92 L
TLC % pred: 87 %
TLC: 5.09 L

## 2019-09-08 NOTE — Patient Instructions (Signed)
I have reviewed your lung function test which did not show any evidence of asthma We can stop the Symbicort as its not helping Continue to work on weight loss, consider weight loss surgery Follow-up in 1 year.

## 2019-09-08 NOTE — Progress Notes (Signed)
Full PFT performed today. °

## 2019-09-08 NOTE — Progress Notes (Signed)
Jennifer Dickson    [id number]    [birth year]  Primary Care Physician:Bailey, Holli Humbles, FNP  Referring Physician: Barrie Lyme, FNP [address] [place],  [place] [address]  Chief complaint: Follow up for dyspnea   HPI: 44 year old with diabetes mellitus, fibromyalgia, hypertension, morbid obesity Complains of dyspnea on exertion for the past 1 to 2 years. Associated with wheezing.  No cough, sputum production, fevers  She had a cardiac work-up at [facility] for dyspnea, palpitation including normal echocardiogram, heart catheterization.  Evaluated by ED on 05/07/2019 with normal BNP, troponin.  She got a CTA for elevated D-dimer however there were no PE or lung abnormalities.  [facility] currently on Symbicort but has not noted any difference with her breathing.  Recently started on Singulair on 05/14/2019.   Pets: Has a dog Occupation: Used to work as a Psychologist, counselling.  Currently on disability Exposures: No known exposures.  No mold, hot tub, Jacuzzi.  No down pillows or comforters Smoking history: Minimal smoking history.  Quit in 2000 Travel history: No significant travel history Relevant family history: Sister and mother had asthma.  Brother and mother had unspecified clotting disorder.  Interim history: Here for review of PFTs States that breathing is slightly improved Continues with weight loss clinic however she has not kept up recent appointments due to transportation issues.  Outpatient Encounter Medications as of 09/08/2019  Medication Sig  . albuterol (VENTOLIN HFA) 108 (90 Base) MCG/ACT inhaler Inhale 1 puff into the lungs every 4 (four) hours as needed.  Marland Kitchen amLODipine (NORVASC) 5 MG tablet Take 5 mg by mouth daily.  Marland Kitchen atorvastatin (LIPITOR) 20 MG tablet Take 1 tablet by mouth daily.  . baclofen (LIORESAL) 10 MG tablet Take 10 mg by mouth every 8 (eight) hours as needed for muscle spasms.  .  carbamide peroxide (DEBROX) 6.5 % OTIC solution Place 5 drops into both ears daily as needed (Ear wax removal).   . cetirizine (ZYRTEC) 10 MG tablet Take 10 mg by mouth daily.  . chlorthalidone (HYGROTON) 25 MG tablet Take 25 mg by mouth daily.  . Cholecalciferol (VITAMIN D3 PO) Take 1 tablet by mouth daily.  . diclofenac Sodium (VOLTAREN) 1 % GEL Apply 2 g topically 2 (two) times daily as needed (Joint pain).  Marland Kitchen esomeprazole (NEXIUM) 40 MG capsule Take 40 mg by mouth daily.  . ferrous gluconate (FERGON) 324 MG tablet Take 324 mg by mouth every morning.  . fluconazole (DIFLUCAN) 100 MG tablet Take 100 mg by mouth daily.   . fluticasone (FLONASE) 50 MCG/ACT nasal spray Place 2 sprays into both nostrils daily as needed for allergies or rhinitis.   . hydrOXYzine (ATARAX/VISTARIL) 25 MG tablet Take 25 mg by mouth 3 (three) times daily as needed for anxiety or itching.   Marland Kitchen ibuprofen (ADVIL) 600 MG tablet Take 600 mg by mouth every 6 (six) hours as needed for fever, headache or moderate pain.  Marland Kitchen JARDIANCE 10 MG TABS tablet Take 10 mg by mouth daily.  . meclizine (ANTIVERT) 25 MG tablet Take 25 mg by mouth 3 (three) times daily as needed.   . montelukast (SINGULAIR) 10 MG tablet Take by mouth.  . Multiple Vitamin tablet Take 1 tablet by mouth daily.  Marland Kitchen nystatin (MYCOSTATIN/NYSTOP) powder Apply 1 g 4 (four) times daily as needed topically (Dry Skin).  . nystatin cream (MYCOSTATIN) Apply 1 application 2 (two) times daily as  needed topically for dry skin.  Marland Kitchen oxyCODONE (OXY IR/ROXICODONE) 5 MG immediate release tablet Take 5 mg by mouth every 8 (eight) hours as needed for severe pain.  . Probiotic Product (UP4 PROBIOTICS WOMENS PO) Take 1 capsule by mouth daily.   . promethazine (PHENERGAN) 25 MG tablet Take 1 tablet (25 mg total) every 6 (six) hours as needed by mouth for nausea or vomiting.  . sodium chloride (OCEAN) 0.65 % nasal spray Place 1 spray into the nose daily as needed for congestion.   .  SUMAtriptan (IMITREX) 100 MG tablet Take 1 tablet (100 mg total) by mouth once as needed for migraine. May repeat in 2 hours if headache persists or recurs.  . SYMBICORT 160-4.5 MCG/ACT inhaler Inhale 2 puffs into the lungs 2 (two) times daily.   Marland Kitchen tetrahydrozoline 0.05 % ophthalmic solution Place 2 drops into both eyes daily as needed (Dry eyes).  . TRINTELLIX 10 MG TABS tablet Take 10 mg by mouth daily.   No facility-administered encounter medications on file as of 09/08/2019.    Physical Exam: Blood pressure 110/60, pulse 94, temperature 98.1 F (36.7 C), temperature source Temporal, height 5\' 9"  (1.753 m), weight (!) 341 lb (154.7 kg), SpO2 100 %. Gen:      No acute distress, Obese HEENT:  EOMI, sclera anicteric Neck:     No masses; no thyromegaly Lungs:    Clear to auscultation bilaterally; normal respiratory effort CV:         Regular rate and rhythm; no murmurs Abd:      + bowel sounds; soft, non-tender; no palpable masses, no distension Ext:    No edema; adequate peripheral perfusion Skin:      Warm and dry; no rash Neuro: alert and oriented x 3 Psych: normal mood and affect  Data Reviewed: Imaging: CTA 05/07/2019-no pulmonary embolism, minimal basal atelectasis. I have reviewed the images personally.  PFTs: 09/08/2019 FVC 2.81 [38%], FEV1 2.25 [71%], F/F 80, TLC 5.09 [87%], DLCO 34.16 [10 and 35%] Normal test Reduced ERV suggests effect of obesity  Labs: CBC 01/18/2019-WBC 6.7, eos 0%  Work up at [facility] Coronary angiogram 11/2018. No significant obstructive coronary disease. .  Echocardiogram 05/2018. Normal LV EF 60-65% with normal wall motion. Normal wall thickness. Normal diastolic function. No valvular heart disease.   Lexiscan Cardiolite SPECT study 07/2018.  1. Pharmacologic stress protocol performed with Lexiscan due to poor exercise tolerance. 2. Normal left ventricular systolic function, LV ejection fraction 59%. No regional wall motion abnormality. 3. Normal  Cardiolite SPECT study. Medium size perfusion abnormality in the anterior and septal region. Probable artifact due to breast attenuation.  CT chest angio 10/2018. 1. No explanation for patient's persistent chest pain and shortness  of breath. Specifically, no evidence of pulmonary embolism.  2. Borderline cardiomegaly. Further evaluation cardiac echo could be  performed as indicated.  3. Suspected hepatic steatosis with mild nodularity hepatic contour  as could be seen in the setting of early cirrhotic change.  Correlation with LFTs is advised.   Assessment:  Follow-up for dyspnea She has a diagnosis of asthma.  However PFTs with no obstruction.  This reduced ERV suggestive of obesity affect Since Symbicort is not helping we will stop it Cardiac work-up as noted above which is normal I have reviewed her CTA with no significant interstitial lung abnormality.  Suspect that body habitus and obesity is playing a significant role She continues on her weight loss program. Consider weight loss surgery.  She will take  this up with her primary care  Suspected OSA/OHS Follows with sleep medicine at [facility]  Plan/Recommendations: Stop Symbicort, continue weight loss measures  Marshell Garfinkel MD [facility] 09/08/2019, 12:10 PM  CC: Barrie Lyme, FNP

## 2019-09-11 ENCOUNTER — Encounter: Payer: Self-pay | Admitting: Physical Therapy

## 2019-09-11 ENCOUNTER — Other Ambulatory Visit: Payer: Self-pay

## 2019-09-11 ENCOUNTER — Ambulatory Visit: Payer: Medicaid Other | Attending: Physician Assistant | Admitting: Physical Therapy

## 2019-09-11 DIAGNOSIS — R293 Abnormal posture: Secondary | ICD-10-CM

## 2019-09-11 DIAGNOSIS — M25531 Pain in right wrist: Secondary | ICD-10-CM | POA: Diagnosis not present

## 2019-09-11 DIAGNOSIS — M6281 Muscle weakness (generalized): Secondary | ICD-10-CM | POA: Insufficient documentation

## 2019-09-11 DIAGNOSIS — M654 Radial styloid tenosynovitis [de Quervain]: Secondary | ICD-10-CM | POA: Diagnosis present

## 2019-09-11 NOTE — Patient Instructions (Signed)
Access Code: [email or web address] URL: [email or web address] Date: 09/11/2019 Prepared by: Hilda Blades  Exercises Finkelstein Stretch - 2 x daily - 7 x weekly - 1-2 sets - 10 reps - 2-3 hold Seated Wrist Flexion with Dumbbell - 1 x daily - 7 x weekly - 2 sets - 10 reps Seated Wrist Extension with Dumbbell - 1 x daily - 7 x weekly - 2 sets - 10 reps Seated Wrist Supination Pronation with Can - 1 x daily - 7 x weekly - 2 sets - 10 reps Seated Wrist Radial Deviation with Dumbbell - 1 x daily - 7 x weekly - 2 sets - 10 reps Putty Squeezes - 1 x daily - 7 x weekly - 2 sets - 10 reps

## 2019-09-11 NOTE — Therapy (Addendum)
[place], [place], [address] Phone: [phone]   Fax:  [phone]  Physical Therapy Treatment / Discharge  Patient Details  Name: Jennifer Dickson MRN: [id number] Date of Birth: [birth year] Referring Provider (PT): Windell Hummingbird, PA-C   Encounter Date: 09/11/2019   PT End of Session - 09/11/19 1413    Visit Number 2    Number of Visits 4    Date for PT Re-Evaluation 09/25/19    Authorization Type Medicaid    PT Start Time 1407    PT Stop Time 1455    PT Time Calculation (min) 48 min    Activity Tolerance Patient limited by pain    Behavior During Therapy [facility] for tasks assessed/performed           Past Medical History:  Diagnosis Date  . Cervical radiculopathy   . COVID-19   . Diabetes mellitus without complication ([place])   . Diverticulosis   . DVT (deep vein thrombosis) in pregnancy   . Endometrioma   . Fibromyalgia   . High blood pressure   . IBS (irritable bowel syndrome)   . Interstitial cystitis   . Osteoarthritis   . Tendonitis     Past Surgical History:  Procedure Laterality Date  . CESAREAN SECTION    . CHOLECYSTECTOMY  1996    There were no vitals filed for this visit.   Subjective Assessment - 09/11/19 1408    Subjective Patient reports she is doing alright, she reports some pain with certain exercises. She is feeling about the same.    Patient Stated Goals Get rid of pain    Currently in Pain? Yes    Pain Score 6     Pain Location Wrist    Pain Orientation Right    Pain Descriptors / Indicators Aching    Pain Type Chronic pain    Pain Onset More than a month ago    Pain Frequency Constant              [facility] PT Assessment - 09/11/19 0001      Palpation   Palpation comment TTP grossly radial aspect of wrist and base of thumb with obvious swelling                         [facility] Adult PT Treatment/Exercise - 09/11/19 0001      Exercises    Exercises Wrist;Elbow      Elbow Exercises   Other elbow exercises UBE x4 min (2 fwd/bwd)      Wrist Exercises   Wrist Flexion Limitations x10 AROM, 2x10 with 1#    Wrist Extension Limitations x10 AROM, 2x10 with 1#    Wrist Radial Deviation Limitations x10 AROM, 2x10 with 1#    Other wrist exercises Pronation/supination x10 AROM, 2x10 with 1#    Other wrist exercises Putty gripping 2x10      Modalities   Modalities Cryotherapy;Electrical Stimulation      Cryotherapy   Number Minutes Cryotherapy 10 Minutes    Cryotherapy Location Wrist    Type of Cryotherapy Ice pack      Electrical Stimulation   Electrical Stimulation Location Radial aspect of wrist    Electrical Stimulation Action Premod 80-150 x10 min    Electrical Stimulation Parameters Patient tolerance for intensity    Electrical Stimulation Goals Pain;Edema                  PT Education -  09/11/19 1412    Education Details HEP update    Person(s) Educated Patient    Methods Explanation;Demonstration;Verbal cues;Handout    Comprehension Verbalized understanding;Returned demonstration;Verbal cues required;Need further instruction            PT Short Term Goals - 09/11/19 1455      PT SHORT TERM GOAL #1   Title Pt will be independent and compliant with initial HEP.    Time 2    Period Weeks    Status On-going    Target Date 09/11/19      PT SHORT TERM GOAL #2   Title Pt will decrease pain by 25% with activity.    Baseline 7/10    Time 3    Period Weeks    Status New    Target Date 09/18/19      PT SHORT TERM GOAL #3   Title Pt will increase grip strength in left hand to >30 lbs (average of 3 trials).    Baseline 20 lbs    Time 3    Period Weeks    Status New    Target Date 09/18/19             PT Long Term Goals - 08/28/19 1743      PT LONG TERM GOAL #1   Title Pt will decrease pain in wrist and thumb to <2/10 with functional activity and ADLs.    Time 8    Period Weeks    Status  New    Target Date 10/23/19      PT LONG TERM GOAL #2   Title Pt will increase R wrist and forearm MMT to at least 4/5, in order to tolerate IADLs and increase activity level.    Time 8    Period Weeks    Status New    Target Date 10/23/19      PT LONG TERM GOAL #3   Title Pt will increase R grip strength to >50 lbs for improved symmetry with nondominant L hand.    Time 8    Period Weeks    Status New    Target Date 10/23/19                 Plan - 09/11/19 1413    Clinical Impression Statement Patient limited by pain with therapy this visit. Initiated light strengthening this visit, patient able to complete but she did report increased pain. Symptoms due seem consistent with tenosynovitis but patient with gross tenderness of radial aspect of wrist. Use of ice and e-stim post teherapy to reduce pain and swelling on posterior radial aspect of wrist. Patient would benefit from continued skilled PT to progress her wrist motion and strength to reduce pain and maximize functional level.    PT Treatment/Interventions ADLs/Self Care Home Management;Cryotherapy;Moist Heat;Therapeutic activities;Neuromuscular re-education;Therapeutic exercise;Patient/family education;Manual techniques;Passive range of motion;Dry needling;Splinting;Taping;Joint Manipulations    PT Next Visit Plan Assess response to HEP, modalities PRN for pain, manual as indicated to address joint restriction/ROM limitation    PT Home Exercise Plan [id number]: Finkelstein stretch to tolerance, wrist flexion/extension, radial deviations, pronation/supination with 1#, putty gripping    Consulted and Agree with Plan of Care Patient           Patient will benefit from skilled therapeutic intervention in order to improve the following deficits and impairments:  Obesity, Improper body mechanics, Impaired perceived functional ability, Increased edema, Decreased range of motion, Decreased activity tolerance, Decreased strength,  Increased fascial restricitons, Postural dysfunction,  Impaired UE functional use, Pain  Visit Diagnosis: Pain in right wrist  Radial styloid tenosynovitis (de quervain)  Abnormal posture  Muscle weakness (generalized)     Problem List Patient Active Problem List   Diagnosis Date Noted  . Obesity, morbid, BMI 50 or higher ([place]) 06/16/2015  . Abdominal wall mass of suprapubic region 06/16/2015    Hilda Blades, PT, DPT, LAT, ATC 09/11/19  2:59 PM Phone: [phone] Fax: [place] [facility] [address] [place], [place], [address] Phone: [phone]   Fax:  [phone]  Name: Abril Cappiello MRN: [id number] Date of Birth: [birth year]   PHYSICAL THERAPY DISCHARGE SUMMARY  Visits from Start of Care: 2  Current functional level related to goals / functional outcomes: See above   Remaining deficits: See above   Education / Equipment: HEP  Plan: Patient agrees to discharge.  Patient goals were not met. Patient is being discharged due to not returning since the last visit.  ?????    Hilda Blades, PT, DPT, LAT, ATC 10/20/19  9:08 AM Phone: [phone] Fax: [phone]

## 2019-09-18 ENCOUNTER — Ambulatory Visit: Payer: Medicaid Other | Admitting: Physical Therapy

## 2019-10-06 DIAGNOSIS — Z0289 Encounter for other administrative examinations: Secondary | ICD-10-CM

## 2019-10-23 ENCOUNTER — Encounter (INDEPENDENT_AMBULATORY_CARE_PROVIDER_SITE_OTHER): Payer: Self-pay | Admitting: Family Medicine

## 2019-10-23 ENCOUNTER — Ambulatory Visit (INDEPENDENT_AMBULATORY_CARE_PROVIDER_SITE_OTHER): Payer: Medicaid Other | Admitting: Family Medicine

## 2019-10-23 ENCOUNTER — Other Ambulatory Visit: Payer: Self-pay

## 2019-10-23 VITALS — BP 111/75 | HR 95 | Temp 98.1°F | Ht 67.0 in | Wt 359.0 lb

## 2019-10-23 DIAGNOSIS — E785 Hyperlipidemia, unspecified: Secondary | ICD-10-CM

## 2019-10-23 DIAGNOSIS — E1169 Type 2 diabetes mellitus with other specified complication: Secondary | ICD-10-CM | POA: Diagnosis not present

## 2019-10-23 DIAGNOSIS — Z9989 Dependence on other enabling machines and devices: Secondary | ICD-10-CM

## 2019-10-23 DIAGNOSIS — E559 Vitamin D deficiency, unspecified: Secondary | ICD-10-CM | POA: Insufficient documentation

## 2019-10-23 DIAGNOSIS — R5383 Other fatigue: Secondary | ICD-10-CM

## 2019-10-23 DIAGNOSIS — E1159 Type 2 diabetes mellitus with other circulatory complications: Secondary | ICD-10-CM | POA: Insufficient documentation

## 2019-10-23 DIAGNOSIS — F3289 Other specified depressive episodes: Secondary | ICD-10-CM

## 2019-10-23 DIAGNOSIS — E782 Mixed hyperlipidemia: Secondary | ICD-10-CM | POA: Insufficient documentation

## 2019-10-23 DIAGNOSIS — G4733 Obstructive sleep apnea (adult) (pediatric): Secondary | ICD-10-CM

## 2019-10-23 DIAGNOSIS — R0602 Shortness of breath: Secondary | ICD-10-CM | POA: Diagnosis not present

## 2019-10-23 DIAGNOSIS — M797 Fibromyalgia: Secondary | ICD-10-CM

## 2019-10-23 DIAGNOSIS — E119 Type 2 diabetes mellitus without complications: Secondary | ICD-10-CM | POA: Insufficient documentation

## 2019-10-23 DIAGNOSIS — E538 Deficiency of other specified B group vitamins: Secondary | ICD-10-CM

## 2019-10-23 DIAGNOSIS — Z6841 Body Mass Index (BMI) 40.0 and over, adult: Secondary | ICD-10-CM | POA: Insufficient documentation

## 2019-10-23 DIAGNOSIS — D649 Anemia, unspecified: Secondary | ICD-10-CM | POA: Insufficient documentation

## 2019-10-23 DIAGNOSIS — I152 Hypertension secondary to endocrine disorders: Secondary | ICD-10-CM

## 2019-10-23 DIAGNOSIS — D508 Other iron deficiency anemias: Secondary | ICD-10-CM

## 2019-10-24 LAB — VITAMIN D 25 HYDROXY (VIT D DEFICIENCY, FRACTURES): Vit D, 25-Hydroxy: 28.3 ng/mL — ABNORMAL LOW (ref 30.0–100.0)

## 2019-10-24 LAB — INSULIN, RANDOM: INSULIN: 45.1 u[IU]/mL — ABNORMAL HIGH (ref 2.6–24.9)

## 2019-10-24 LAB — VITAMIN B12: Vitamin B-12: 721 pg/mL (ref 232–1245)

## 2019-10-24 LAB — T3: T3, Total: 127 ng/dL (ref 71–180)

## 2019-10-24 LAB — FOLATE: Folate: 9.6 ng/mL (ref >3.0)

## 2019-10-24 LAB — T4: T4, Total: 7.4 ug/dL (ref 4.5–12.0)

## 2019-10-24 LAB — TSH: TSH: 1.71 u[IU]/mL (ref 0.450–4.500)

## 2019-10-28 NOTE — Progress Notes (Signed)
Chief Complaint:   OBESITY Jennifer Dickson (MR# [id number]) is a 44 y.o. female who presents for evaluation and treatment of obesity and related comorbidities. Current BMI is Body mass index is 56.23 kg/m. Jennifer Dickson has been struggling with her weight for many years and has been unsuccessful in either losing weight, maintaining weight loss, or reaching her healthy weight goal.  Jennifer Dickson is currently in the action stage of change and ready to dedicate time achieving and maintaining a healthier weight. Jennifer Dickson is interested in becoming our patient and working on intensive lifestyle modifications including (but not limited to) diet and exercise for weight loss.  Jennifer Dickson is on disability.  She is divorced and lives alone.  She loves sweets, mac & cheese, potatoes, and pasta.  She skips breakfast and lunch daily.  She sees a PCP at [facility].  Recently had a CBC, CMP, A1c, and FLP.  Jennifer Dickson's habits were reviewed today and are as follows: her desired weight loss is 165 pounds, she started gaining weight after having her first child at age 49, her heaviest weight ever was 365 pounds, she is a picky eater and doesn't like to eat healthier foods, she craves sweets, potatoes, pasta, mac & cheese, and pickles, she snacks frequently in the evenings, she wakes up frequently in the middle of the night to eat, she skips breakfast and/or lunch frequently, she is frequently drinking liquids with calories, she frequently makes poor food choices, she has problems with excessive hunger, she frequently eats larger portions than normal and she struggles with emotional eating.  Depression Screen Jennifer Dickson's Food and Mood (modified PHQ-9) score was 21.  Depression screen PHQ 2/9 10/23/2019  Decreased Interest 3  Down, Depressed, Hopeless 3  PHQ - 2 Score 6  Altered sleeping 3  Tired, decreased energy 3  Change in appetite 2  Feeling bad or failure about yourself  3  Trouble concentrating 3  Moving slowly or  fidgety/restless 1  Suicidal thoughts 0  PHQ-9 Score 21  Difficult doing work/chores Very difficult     Assessment/Plan:   1. Other fatigue Marbeth admits to daytime somnolence and reports waking up still tired. Patent has a history of symptoms of daytime fatigue, morning fatigue, morning headache and snoring. [name] generally gets 4 or 5 hours of sleep per night, and states that she has poor quality sleep. Snoring is present. Apneic episodes are present. Epworth Sleepiness Score is 21.  [name] does feel that her weight is causing her energy to be lower than it should be. Fatigue may be related to obesity, depression or many other causes. Labs will be ordered, and in the meanwhile, Jennifer Dickson will focus on self care including making healthy food choices, increasing physical activity and focusing on stress reduction.  - EKG 12-Lead - T3 - T4 - TSH    2. SOB (shortness of breath) on exertion Jennifer Dickson notes increasing shortness of breath with exercising and seems to be worsening over time with weight gain. She notes getting out of breath sooner with activity than she used to. This has gotten worse recently. Jennifer Dickson denies shortness of breath at rest or orthopnea.  Tachina does feel that she gets out of breath more easily that she used to when she exercises. Jennifer Dickson's shortness of breath appears to be obesity related and exercise induced. She has agreed to work on weight loss and gradually increase exercise to treat her exercise induced shortness of breath. Will continue to monitor closely.    3. Type 2  diabetes mellitus with other specified complication, without long-term current use of insulin ([name]) Jennifer Dickson is treated by her PCP.  Her A1c on 10/08/2019 was 8.1.  She does not check her blood sugars at home, but has the machine to do so.  She is prescribed Jardiance 10 mg daily, but she has been off it for a month due to insurance complications with Medicaid.  Plan:  Good blood sugar control  is important to decrease the likelihood of diabetic complications such as nephropathy, neuropathy, limb loss, blindness, coronary artery disease, and death. Intensive lifestyle modification including diet, exercise and weight loss are the first line of treatment for diabetes.  Start prudent nutritional plan and weight loss.  Will check insulin level today.   Lab Results  Component Value Date   CREATININE 1.00 05/06/2019   Lab Results  Component Value Date   INSULIN 45.1 (H) 10/23/2019   - Insulin, random   4. Hypertension associated with diabetes ([place]) Review: taking medications as instructed, no medication side effects noted, no chest pain on exertion, no dyspnea on exertion, no swelling of ankles.  She is taking Norvasc 5 mg daily and chlorthalidone 25 mg daily.  Plan:  Jennifer Dickson is working on healthy weight loss and exercise to improve blood pressure control. We will watch for signs of hypotension as she continues her lifestyle modifications.  Start prudent nutritional plan with low salt diet and weight loss.  Check labs today.  BP Readings from Last 3 Encounters:  10/23/19 111/75  09/08/19 110/60  05/21/19 138/74     5. Hyperlipidemia associated with type 2 diabetes mellitus ([place]) Jennifer Dickson has hyperlipidemia and has been trying to improve her cholesterol levels with intensive lifestyle modification including a low saturated fat diet, exercise and weight loss. She denies any chest pain, claudication or myalgias.  She is taking Lipitor 20 mg daily.  Plan:  Continue Lipitor.  Reduce sat/trans fats through prudent nutritional plan.  Cardiovascular risk and specific lipid/LDL goals reviewed.  We discussed several lifestyle modifications today and Jennifer Dickson will continue to work on diet, exercise and weight loss efforts. Orders and follow up as documented in patient record.   Counseling Intensive lifestyle modifications are the first line treatment for this issue. . Dietary changes:  Increase soluble fiber. Decrease simple carbohydrates. . Exercise changes: Moderate to vigorous-intensity aerobic activity 150 minutes per week if tolerated. . Lipid-lowering medications: see documented in medical record.  Lab Results  Component Value Date   ALT 49 (H) 05/06/2019   AST 38 05/06/2019   ALKPHOS 112 05/06/2019   BILITOT 0.7 05/06/2019     6. B12 deficiency She notes fatigue. She is not a vegetarian.  She does not have a previous diagnosis of pernicious anemia.  She does not have a history of weight loss surgery.   Plan:  The diagnosis was reviewed with the patient. Counseling provided today, see below. We will continue to monitor. Orders and follow up as documented in patient record.  Will check vitamin B12 level today.  Counseling . The body needs vitamin B12: to make red blood cells; to make DNA; and to help the nerves work properly so they can carry messages from the brain to the body.  . The main causes of vitamin B12 deficiency include dietary deficiency, digestive diseases, pernicious anemia, and having a surgery in which part of the stomach or small intestine is removed.  . Certain medicines can make it harder for the body to absorb vitamin B12. These medicines include:  heartburn medications; some antibiotics; some medications used to treat diabetes, gout, and high cholesterol.  . In some cases, there are no symptoms of this condition. If the condition leads to anemia or nerve damage, various symptoms can occur, such as weakness or fatigue, shortness of breath, and numbness or tingling in your hands and feet.   . Treatment:  o May include taking vitamin B12 supplements.  o Avoid alcohol.  o Eat lots of healthy foods that contain vitamin B12: - Beef, pork, chicken, [place], and organ meats, such as liver.  - Seafood: This includes clams, rainbow trout, salmon, tuna, and haddock. Eggs.  - Cereal and dairy products that are fortified: This means that vitamin B12 has been  added to the food.   Lab Results  Component Value Date   VITAMINB12 721 10/23/2019   - Vitamin B12 - Folate    7. Vitamin D deficiency Mehr Depaoli has a history of Vitamin D deficiency with resultant generalized fatigue as her primary symptom.  she is taking a vitamin D supplement OTC for this deficiency and tolerating it well without side-effect.    Plan:  - Discussed importance of vitamin D (as well as calcium) to their health and well-being. -Will check vitamin D level today.   8. Fibromyalgia She endorses "aches all over".  She says it is difficult to stand to cook or lift her arms to cook.  Plan:  Recommend Physical Therapy for her morbid obesity.  Referral has been placed today.  - VITAMIN D 25 Hydroxy (Vit-D Deficiency, Fractures)    8. Fibromyalgia She endorses "aches all over".  She says it is difficult to stand to cook or lift her arms to cook.  Plan:  Recommend Physical Therapy for her morbid obesity.  Referral has been placed today.   - Ambulatory referral to Physical Therapy Orders Placed This Encounter  Procedures  . Vitamin B12  . Folate  . Insulin, random  . T3  . T4  . TSH  . VITAMIN D 25 Hydroxy (Vit-D Deficiency, Fractures)  . Ambulatory referral to Physical Therapy  . EKG 12-Lead     9. OSA on CPAP Floree has a diagnosis of sleep apnea. She reports that she is using a CPAP regularly.  She has a nasal mast that she uses nightly.  Epworth sleepiness scale is 21.    10. Other depression, with emotional eating Nikayla denies SI.  PHQ-9 is 21.  Plan:  Treatment by [facility].  She also sees a Social worker, Danae Chen, monthly.    11. Class 3 severe obesity with serious comorbidity and body mass index (BMI) of 50.0 to 59.9 in adult, unspecified obesity type [facility])  - Ambulatory referral to Physical Therapy   Noela is currently in the action stage of change and her goal is to continue with weight loss efforts. I recommend [place]  begin the structured treatment plan as follows:  She has agreed to the Category 3 Plan.  Exercise goals: Placed referral to PT.   Behavioral modification strategies: decreasing simple carbohydrates, decreasing liquid calories, decreasing sodium intake, no skipping meals, meal planning and cooking strategies, keeping healthy foods in the home, ways to avoid night time snacking, avoiding temptations and planning for success.  She was informed of the importance of frequent follow-up visits to maximize her success with intensive lifestyle modifications for her multiple health conditions. She was informed we would discuss her lab results at her next visit unless there is a critical issue that needs  to be addressed sooner. Joselyne agreed to keep her next visit at the agreed upon time to discuss these results.    Objective:   Blood pressure 111/75, pulse 95, temperature 98.1 F (36.7 C), height _0  (1.702 m), weight (!) 359 lb (162.8 kg), SpO2 98 %. Body mass index is 56.23 kg/m.  EKG: Normal sinus rhythm, rate 92 bpm.  Indirect Calorimeter completed today shows a VO2 of 414 and a REE of 2879.  Her calculated basal metabolic rate is 1833 thus her basal metabolic rate is better than expected.  General: Cooperative, alert, well developed, in no acute distress. HEENT: Conjunctivae and lids unremarkable. Cardiovascular: Regular rhythm.  Lungs: Normal work of breathing. Neurologic: No focal deficits.   Lab Results  Component Value Date   CREATININE 1.00 05/06/2019   BUN 16 05/06/2019   NA 137 05/06/2019   K 3.6 05/06/2019   CL 96 (L) 05/06/2019   CO2 27 05/06/2019   Lab Results  Component Value Date   ALT 49 (H) 05/06/2019   AST 38 05/06/2019   ALKPHOS 112 05/06/2019   BILITOT 0.7 05/06/2019   Lab Results  Component Value Date   INSULIN 45.1 (H) 10/23/2019   Lab Results  Component Value Date   TSH 1.710 10/23/2019   Lab Results  Component Value Date   WBC 18.7 (H)  05/06/2019   HGB 13.0 05/06/2019   HCT 43.6 05/06/2019   MCV 83.0 05/06/2019   PLT 555 (H) 05/06/2019   Attestation Statements:   Reviewed by clinician on day of visit: allergies, medications, problem list, medical history, surgical history, family history, social history, and previous encounter notes.  I, Water quality scientist, CMA, am acting as Location manager for [employer], DO.  I have reviewed the above documentation for accuracy and completeness, and I agree with the above. Marjory Sneddon, D.O.  The [place] was signed into law in 2016 which includes the topic of electronic health records.  This provides immediate access to information in MyChart.  This includes consultation notes, operative notes, office notes, lab results and pathology reports.  If you have any questions about what you read please let us know at your next visit so we can discuss your concerns and take corrective action if need be.  We are right here with you.

## 2019-11-06 ENCOUNTER — Ambulatory Visit (INDEPENDENT_AMBULATORY_CARE_PROVIDER_SITE_OTHER): Payer: Medicaid Other | Admitting: Family Medicine

## 2019-11-10 ENCOUNTER — Ambulatory Visit (INDEPENDENT_AMBULATORY_CARE_PROVIDER_SITE_OTHER): Payer: Medicaid Other | Admitting: Family Medicine

## 2019-11-10 ENCOUNTER — Encounter (INDEPENDENT_AMBULATORY_CARE_PROVIDER_SITE_OTHER): Payer: Self-pay | Admitting: Family Medicine

## 2019-11-10 ENCOUNTER — Other Ambulatory Visit: Payer: Self-pay

## 2019-11-10 VITALS — BP 118/82 | HR 104 | Temp 98.2°F | Ht 67.0 in | Wt 349.0 lb

## 2019-11-10 DIAGNOSIS — I152 Hypertension secondary to endocrine disorders: Secondary | ICD-10-CM | POA: Diagnosis not present

## 2019-11-10 DIAGNOSIS — E1169 Type 2 diabetes mellitus with other specified complication: Secondary | ICD-10-CM

## 2019-11-10 DIAGNOSIS — E785 Hyperlipidemia, unspecified: Secondary | ICD-10-CM

## 2019-11-10 DIAGNOSIS — E559 Vitamin D deficiency, unspecified: Secondary | ICD-10-CM | POA: Diagnosis not present

## 2019-11-10 DIAGNOSIS — E1159 Type 2 diabetes mellitus with other circulatory complications: Secondary | ICD-10-CM | POA: Diagnosis not present

## 2019-11-10 DIAGNOSIS — Z6841 Body Mass Index (BMI) 40.0 and over, adult: Secondary | ICD-10-CM

## 2019-11-10 MED ORDER — VITAMIN D (ERGOCALCIFEROL) 1.25 MG (50000 UNIT) PO CAPS: 50000.0000 [IU] | ORAL_CAPSULE | ORAL | 0 refills | Status: DC

## 2019-11-10 NOTE — Addendum Note (Signed)
Addended by: Neomia Dear on: 11/10/2019 12:22 PM   Modules accepted: Orders

## 2019-11-11 NOTE — Progress Notes (Signed)
Chief Complaint:   OBESITY Jennifer Dickson is here to discuss her progress with her obesity treatment plan along with follow-up of her obesity related diagnoses. [name] is on the Category 3 Plan and states she is following her eating plan approximately 90% of the time. [name] states she is exercising for 0 minutes 0 times per week.  Today's visit was #: 2 Starting weight: 359 lbs Starting date: 10/23/2019 Today's weight: 349 lbs Today's date: 11/10/2019 Total lbs lost to date: 10 lbs Total lbs lost since last in-office visit: 10 lbs Total weight loss percentage to date: -2.79%   Interim History:    This is Jennifer Dickson's first follow-up visit.    She says it "went well".   Hunger is controlled.   She is weighing meats/proteins.  She did not start until October 20.    She says it is hard to eat breakfast.  She says, "I am snacking on gummy bears (70 calories), fruit pops (70 calories), and don't go over 300 calories per day.    " I have cravings for snacks, but find it easy to control once I allow myself a little bit." But usually, these cravings come on when she has not eaten all her foods she was suppose to.  She is only drinking calorie-free liquids.  Assessment/Plan:   Meds ordered this encounter  Medications  . Vitamin D, Ergocalciferol, (DRISDOL) 1.25 MG (50000 UNIT) CAPS capsule    Sig: Take 1 capsule (50,000 Units total) by mouth every 7 (seven) days.    Dispense:  4 capsule    Refill:  0      1. Type 2 diabetes mellitus with other specified complication, without long-term current use of insulin ([place]) Discussed labs with patient today.    "I haven't had my diabetes medication due to medication confliction with my PCP's office."  She will call today to get medications straightened out with her PCP.    FBS at home 120s-130s, she says.  Denies lows.  Highest 146, she says.   10/08/19- A1c 8.1 with PCP via [facility] everywhere used to review labs.   Plan:   - Poorly  controlled.  NOT AT Goal A1c of <7.0 discussed with her today.  Call PCP regarding medication management.  Consider changing medications in the future, which she declines today due to being scared of needles after we discussed GLP1's.  Continue prudent nutritional plan and weight loss.  Lab Results  Component Value Date   CREATININE 1.00 05/06/2019   Lab Results  Component Value Date   INSULIN 45.1 (H) 10/23/2019      2. Hypertension associated with type 2 diabetes mellitus ([place]) Discussed labs with patient today.  Denies shortness of breath, chest pain, dizziness, orthostatic symptoms, etc.  No concerns with medications. Norvasc, hygroton  Plan:  Blood pressure is essentially at goal of 120/80 or less.   Continue medications per PCP.  Pt is not on an ARB or ACE inh.  I rec to pt to discuss this with her PCP.    BP Readings from Last 3 Encounters:  11/10/19 118/82  10/23/19 111/75  09/08/19 110/60     3. Hyperlipidemia associated with type 2 diabetes mellitus ([place]) Discussed labs with patient today.   Labs at [facility] on 10/08/2019 showed an LDL of 130 most recently.  She is on Lipitor 20mg  daily.   - She says she is not missing doses.  Plan:   She was told that her LDL  was not at goal of <70.  She will contact her PCP regarding better control/ medication changes; told pt she is not at max dose of statin.   Weight loss via prudent nutritional plan, low salt/low trans fat diet. Monitor closely.   10/08/19   Cholesterol, Total 100 - 199 mg/dL 222High        Triglycerides 0 - 149 mg/dL 160High        HDL >39 mg/dL 64        VLDL Cholesterol Cal 5 - 40 mg/dL 28        LDL 0 - 99 mg/dL 130High      Lab Results  Component Value Date   ALT 49 (H) 05/06/2019   AST 38 05/06/2019   ALKPHOS 112 05/06/2019   BILITOT 0.7 05/06/2019     4. Vitamin D deficiency Discussed labs with patient today.  Medha has a history of Vitamin D deficiency with resultant generalized  fatigue as her primary symptom.  she is taking an OTC vitamin D supplement for this deficiency and tolerating it well without side-effect.  Most recent Vitamin D lab reviewed-  level: 28.3 on 10/23/2019.  She endorses fatigue and is achy in her joints.  Plan:  - Discussed importance of vitamin D (as well as calcium) to their health and wellbeing.   - We reviewed possible symptoms of low Vitamin D including low energy, depressed mood, muscle aches, joint aches, osteoporosis, etc.  - We discussed that low Vitamin D levels may be linked to an increased risk of cardiovascular events, and even increased risk of cancers, such as colon and breast.   - Educated pt that weight loss will likely improve availability of vitamin D, thus encouraged Terica to continue with meal plan and their weight loss efforts to further improve this condition.  - I recommend pt take weekly prescription vit D 50,000 - see script below- which pt agrees to after discussion of the risks and benefits of this medication.      - Informed patient this may be a lifelong thing, and she was encouraged to continue to take the medicine until told otherwise.  We will need to monitor levels regularly (every 3-4 mo on average) to keep levels within normal limits.   - All pt's questions and concerns regarding this condition addressed.  -Start Vitamin D, Ergocalciferol, (DRISDOL) 1.25 MG (50000 UNIT) CAPS capsule; Take 1 capsule (50,000 Units total) by mouth every 7 (seven) days.  Dispense: 4 capsule; Refill: 0    5. Class 3 severe obesity with serious comorbidity and body mass index (BMI) of 50.0 to 59.9 in adult, unspecified obesity type [facility])  Jennifer Dickson is currently in the action stage of change. As such, her goal is to continue with weight loss efforts. She has agreed to the Category 3 Plan.   Exercise goals: Start PT as ordered last office visit.   Due to [name]'s current state of health and medical condition(s) which are not at  goal - LDL and A1c, she is at a higher risk for heart disease.   This puts the patient at much greater risk to subsequently develop cardiopulmonary conditions that can significantly affect patient's quality of life in a negative manner in the future as well.    A significant amount of time was spent on counseling Bryttney about these concerns today and I stressed the importance of reversing risks factors of obesity, esp truncal and visceral fat, hypertension, hyperlipidemia, diabetes etc.     Initial  goal is to lose at least 5-10% of starting weight to help reduce these risk factors.   Counseling: Intensive lifestyle modifications discussed with [place] as most appropriate first line treatment.  she will continue to work on diet, exercise and weight loss efforts.  We will continue to reassess these conditions on a fairly regular basis in an attempt to decrease patient's overall morbidity and mortality.  Evidence-based interventions for health behavior change were utilized today including the discussion of self monitoring techniques, problem-solving barriers and SMART goal setting techniques.  Specifically regarding patient's less desirable eating habits and patterns, we employed the technique of small changes when Sharanda has not been able to fully commit to her prudent nutritional plan.  Behavioral modification strategies: increasing lean protein intake, decreasing simple carbohydrates, meal planning and cooking strategies, keeping healthy foods in the home, better snacking choices and planning for success.  Joreen has agreed to follow-up with our clinic in 2 weeks. She was informed of the importance of frequent follow-up visits to maximize her success with intensive lifestyle modifications for her multiple health conditions.    Objective:   Blood pressure 118/82, pulse (!) 104, temperature 98.2 F (36.8 C), height 5\' 7"  (1.702 m), weight (!) 349 lb (158.3 kg), SpO2 100 %. Body mass index is 54.66  kg/m.  General: Cooperative, alert, well developed, in no acute distress. HEENT: Conjunctivae and lids unremarkable. Cardiovascular: Regular rhythm.  Lungs: Normal work of breathing. Neurologic: No focal deficits.   Lab Results  Component Value Date   CREATININE 1.00 05/06/2019   BUN 16 05/06/2019   NA 137 05/06/2019   K 3.6 05/06/2019   CL 96 (L) 05/06/2019   CO2 27 05/06/2019   Lab Results  Component Value Date   ALT 49 (H) 05/06/2019   AST 38 05/06/2019   ALKPHOS 112 05/06/2019   BILITOT 0.7 05/06/2019   Lab Results  Component Value Date   INSULIN 45.1 (H) 10/23/2019   Lab Results  Component Value Date   TSH 1.710 10/23/2019   Lab Results  Component Value Date   WBC 18.7 (H) 05/06/2019   HGB 13.0 05/06/2019   HCT 43.6 05/06/2019   MCV 83.0 05/06/2019   PLT 555 (H) 05/06/2019   Attestation Statements:   Reviewed by clinician on day of visit: allergies, medications, problem list, medical history, surgical history, family history, social history, and previous encounter notes.  Time spent on visit including pre-visit chart review and post-visit care and charting was 50 minutes.   I, Water quality scientist, CMA, am acting as Location manager for [employer], DO.  I have reviewed the above documentation for accuracy and completeness, and I agree with the above. Marjory Sneddon, D.O.  The [place] was signed into law in 2016 which includes the topic of electronic health records.  This provides immediate access to information in MyChart.  This includes consultation notes, operative notes, office notes, lab results and pathology reports.  If you have any questions about what you read please let us know at your next visit so we can discuss your concerns and take corrective action if need be.  We are right here with you.

## 2019-11-24 ENCOUNTER — Ambulatory Visit (INDEPENDENT_AMBULATORY_CARE_PROVIDER_SITE_OTHER): Payer: Medicaid Other | Admitting: Family Medicine

## 2019-11-24 ENCOUNTER — Other Ambulatory Visit: Payer: Self-pay

## 2019-11-24 ENCOUNTER — Encounter (INDEPENDENT_AMBULATORY_CARE_PROVIDER_SITE_OTHER): Payer: Self-pay | Admitting: Family Medicine

## 2019-11-24 VITALS — BP 117/75 | HR 83 | Temp 98.1°F | Ht 67.0 in | Wt 340.0 lb

## 2019-11-24 DIAGNOSIS — I152 Hypertension secondary to endocrine disorders: Secondary | ICD-10-CM | POA: Diagnosis not present

## 2019-11-24 DIAGNOSIS — E1159 Type 2 diabetes mellitus with other circulatory complications: Secondary | ICD-10-CM | POA: Diagnosis not present

## 2019-11-24 DIAGNOSIS — E1169 Type 2 diabetes mellitus with other specified complication: Secondary | ICD-10-CM

## 2019-11-26 NOTE — Progress Notes (Signed)
Chief Complaint:   OBESITY Jennifer Dickson is here to discuss her progress with her obesity treatment plan along with follow-up of her obesity related diagnoses. Jennifer Dickson is on the Category 3 Plan and states she is following her eating plan approximately 60% of the time. Jennifer Dickson states she is exercising 0 minutes 0 times per week.  Today's visit was #: 3 Starting weight: 359 lbs Starting date: 10/23/2019 Today's weight: 340 lbs Today's date: 11/24/2019 Total lbs lost to date: 19 lbs Total lbs lost since last in-office visit: 9 lbs Total weight loss percentage to date: -5.29%  Interim History: Jennifer Dickson states she eats the foods on the meal plan (this time more than previously). Her hunger and cravings are controlled, and she has no issues or concerns except bored with breakfast.  Assessment/Plan:   1. Type 2 diabetes mellitus with other specified complication, without long-term current use of insulin ([place]) Jennifer Dickson is not checking her blood sugar but denies symptoms of hypoglycemia. She is on Jardiance. She has no family history of thyroid cancer or personal history of pancreatitis.  Plan: Jennifer Dickson will call PCP regarding glucometer and supplies. She will continue medications per PCP. We will consider adding GLP-1 in the future to help with weight loss. Home blood sugar monitoring recommended per PCP recommendation.  2. Hypertension associated with type 2 diabetes mellitus ([place]) Review: taking medications as instructed, no medication side effects noted, no chest pain on exertion, no dyspnea on exertion, no swelling of ankles.   BP Readings from Last 3 Encounters:  11/24/19 117/75  11/10/19 118/82  10/23/19 111/75   Plan: Jennifer Dickson is working on healthy weight loss and exercise to improve blood pressure control. We will watch for signs of hypotension as she continues her lifestyle modifications.  3. Class 3 severe obesity with serious comorbidity and body mass index (BMI) of 50.0 to 59.9 in  adult, unspecified obesity type ([name]) Jennifer Dickson is currently in the action stage of change. As such, her goal is to continue with weight loss efforts. She has agreed to the Category 3 Plan with breakfast options.   Exercise goals: As is.  Behavioral modification strategies: holiday eating strategies  and celebration eating strategies.  Jennifer Dickson has agreed to follow-up with our clinic in 2 weeks. She was informed of the importance of frequent follow-up visits to maximize her success with intensive lifestyle modifications for her multiple health conditions.   Objective:   Blood pressure 117/75, pulse 83, temperature 98.1 F (36.7 C), height 5\' 7"  (1.702 m), weight (!) 340 lb (154.2 kg), SpO2 97 %. Body mass index is 53.25 kg/m.  General: Cooperative, alert, well developed, in no acute distress. HEENT: Conjunctivae and lids unremarkable. Cardiovascular: Regular rhythm.  Lungs: Normal work of breathing. Neurologic: No focal deficits.   Lab Results  Component Value Date   CREATININE 1.00 05/06/2019   BUN 16 05/06/2019   NA 137 05/06/2019   K 3.6 05/06/2019   CL 96 (L) 05/06/2019   CO2 27 05/06/2019   Lab Results  Component Value Date   ALT 49 (H) 05/06/2019   AST 38 05/06/2019   ALKPHOS 112 05/06/2019   BILITOT 0.7 05/06/2019   No results found for: HGBA1C Lab Results  Component Value Date   INSULIN 45.1 (H) 10/23/2019   Lab Results  Component Value Date   TSH 1.710 10/23/2019   No results found for: CHOL, HDL, LDLCALC, LDLDIRECT, TRIG, CHOLHDL Lab Results  Component Value Date   WBC 18.7 (H) 05/06/2019  HGB 13.0 05/06/2019   HCT 43.6 05/06/2019   MCV 83.0 05/06/2019   PLT 555 (H) 05/06/2019    Attestation Statements:   Reviewed by clinician on day of visit: allergies, medications, problem list, medical history, surgical history, family history, social history, and previous encounter notes.  Coral Ceo, am acting as Location manager for [employer],  DO.  I have reviewed the above documentation for accuracy and completeness, and I agree with the above. Marjory Sneddon, D.O.  The [place] was signed into law in 2016 which includes the topic of electronic health records.  This provides immediate access to information in MyChart.  This includes consultation notes, operative notes, office notes, lab results and pathology reports.  If you have any questions about what you read please let us know at your next visit so we can discuss your concerns and take corrective action if need be.  We are right here with you.

## 2019-12-08 ENCOUNTER — Ambulatory Visit: Payer: Medicaid Other | Attending: Physician Assistant

## 2019-12-08 ENCOUNTER — Encounter (INDEPENDENT_AMBULATORY_CARE_PROVIDER_SITE_OTHER): Payer: Self-pay

## 2019-12-08 ENCOUNTER — Ambulatory Visit (INDEPENDENT_AMBULATORY_CARE_PROVIDER_SITE_OTHER): Payer: Medicaid Other | Admitting: Family Medicine

## 2019-12-08 ENCOUNTER — Other Ambulatory Visit: Payer: Self-pay

## 2019-12-08 DIAGNOSIS — R262 Difficulty in walking, not elsewhere classified: Secondary | ICD-10-CM | POA: Diagnosis present

## 2019-12-08 DIAGNOSIS — M25511 Pain in right shoulder: Secondary | ICD-10-CM | POA: Insufficient documentation

## 2019-12-08 DIAGNOSIS — M25611 Stiffness of right shoulder, not elsewhere classified: Secondary | ICD-10-CM | POA: Diagnosis present

## 2019-12-08 DIAGNOSIS — M6281 Muscle weakness (generalized): Secondary | ICD-10-CM | POA: Diagnosis present

## 2019-12-08 DIAGNOSIS — M25562 Pain in left knee: Secondary | ICD-10-CM | POA: Diagnosis present

## 2019-12-08 DIAGNOSIS — S83005D Unspecified dislocation of left patella, subsequent encounter: Secondary | ICD-10-CM | POA: Insufficient documentation

## 2019-12-08 DIAGNOSIS — G8929 Other chronic pain: Secondary | ICD-10-CM | POA: Diagnosis present

## 2019-12-08 NOTE — Therapy (Addendum)
[address] [place], [place], [address] Phone: [phone]   Fax:  [phone]  Physical Therapy Evaluation  Patient Details  Name: Jennifer Dickson MRN: [id number] Date of Birth: 1975-05-21 Referring Provider (PT): Harriet Pho, MD   Encounter Date: 12/08/2019   PT End of Session - 12/08/19 1448    Visit Number 1    Number of Visits 16    Date for PT Re-Evaluation 02/06/20    Authorization Type Medicaid    PT Start Time 0245    PT Stop Time 0330    PT Time Calculation (min) 45 min    Activity Tolerance Patient limited by pain    Behavior During Therapy [facility] for tasks assessed/performed           Past Medical History:  Diagnosis Date  . Anemia   . Anxiety   . Back pain   . Carpal tunnel syndrome   . Cervical radiculopathy   . Cervical radiculopathy   . Chest pain   . Chronic fatigue syndrome   . Constipation   . COVID-19   . Depression   . Diabetes mellitus without complication ([place])   . Diverticulosis   . DJD (degenerative joint disease)   . DVT (deep vein thrombosis) in pregnancy   . DVT (deep venous thrombosis) ([place])   . Endometrioma   . Endometriosis   . Fatty liver   . Fibromyalgia   . Fibromyalgia   . Gallbladder problem   . Heartburn   . High blood pressure   . High cholesterol   . Hypertension   . IBS (irritable bowel syndrome)   . Interstitial cystitis   . Joint pain   . Knee dislocation   . Mini stroke ([place])   . Osteoarthritis   . Osteoarthritis   . Palpitations   . Pinched nerve   . Sleep apnea   . SOB (shortness of breath) on exertion   . Swallowing difficulty   . Swelling of both lower extremities   . Tendonitis   . Varicose veins of both legs with edema   . Vitamin D deficiency     Past Surgical History:  Procedure Laterality Date  . CESAREAN SECTION    . CHOLECYSTECTOMY  1996    There were no vitals filed for this visit.    Subjective Assessment -  12/08/19 1453    Subjective she reportrs Lt knee patella dislocation.   She reports  reaching for item out of freezer and patella poppped out of place. She was sitting.    She received a brace but did not fit.  she also reportsa RT shoulder pain with labral tear starting 2 years ago. now severe   She has fallen pushing cars and had TVs fal on arm  so not sure exact cause of tear.    How long can you walk comfortably? 100-150 feet      Before she was able to walk 1-2 miles. (Last done a year ago. )    Diagnostic tests MRI :  still slight dislocation  9 done [facility])    Patient Stated Goals Get rid of pain    Currently in Pain? Yes    Pain Score 8     Pain Location Knee    Pain Orientation Left    Pain Descriptors / Indicators Aching;Sharp    Pain Type Chronic pain    Pain Onset More than a month ago    Pain Frequency Constant  Aggravating Factors  activity on feet    Pain Relieving Factors lye on side    Multiple Pain Sites Yes    Pain Score 10    Pain Location Shoulder    Pain Orientation Right;Anterior;Posterior    Pain Descriptors / Indicators Sharp   excruciating   Pain Type Chronic pain    Pain Onset More than a month ago    Pain Frequency Constant    Aggravating Factors  Using arm    Pain Relieving Factors meds, nothing really helps              [facility] PT Assessment - 12/08/19 0001      Assessment   Medical Diagnosis LT patella dislocation    Referring Provider (PT) Harriet Pho, MD    Onset Date/Surgical Date --   She thinks September 2021   Next MD Visit As needed    Prior Therapy Not for  LT knee      Precautions   Precaution Comments limit pressure on knees      Restrictions   Weight Bearing Restrictions No      Balance Screen   Has the patient fallen in the past 6 months Yes    Has the patient had a decrease in activity level because of a fear of falling?  Yes    Is the patient reluctant to leave their home because of a fear of falling?  Yes       Prior Function   Level of Independence Requires assistive device for independence   cane and WC since 01/2019     Cognition   Overall Cognitive Status Within Functional Limits for tasks assessed      AROM   AROM Assessment Site Knee;Shoulder    Right/Left Shoulder Right    Right Shoulder Flexion 115 Degrees    Right Shoulder ABduction 105 Degrees    Right Shoulder Internal Rotation 35 Degrees    Right Shoulder External Rotation 90 Degrees    Right/Left Knee Right;Left    Right Knee Extension 8    Right Knee Flexion 110    Left Knee Extension -5    Left Knee Flexion 35   in supine  sitting  95 degrees     Strength   Strength Assessment Site Knee    Right/Left Knee Right;Left    Right Knee Flexion 4+/5    Right Knee Extension 4+/5    Left Knee Flexion 4/5    Left Knee Extension 3-/5   poor quad set.      Flexibility   Soft Tissue Assessment /Muscle Length yes    Hamstrings Assisted SLR  to 70 degrees then incr back pain      Palpation   Palpation comment tender anterior LT knee and RT shoulder      Ambulation/Gait   Ambulation Distance (Feet) 100 Feet    Assistive device Straight cane    Gait Pattern Step-through pattern                      Objective measurements completed on examination: See above findings.               PT Education - 12/08/19 1552    Education Details POC  HEP    Person(s) Educated Patient    Methods Explanation;Demonstration;Verbal cues;Handout;Tactile cues    Comprehension Verbalized understanding;Returned demonstration            PT Short Term Goals - 12/08/19 1537  PT SHORT TERM GOAL #1   Title Pt will be independent and compliant with initial HEP.    Baseline no program    Time 3    Period Weeks    Status New      PT SHORT TERM GOAL #2   Title Pt will decrease pain by 25%  LT knee with walking    Baseline 7/10 pain or higher    Time 3    Period Weeks    Status New      PT SHORT TERM GOAL #3    Title She will decr RT shouldr pain to 8/10 generally    Baseline 10/10 today    Time 3    Period Weeks    Status New             PT Long Term Goals - 12/08/19 1539      PT LONG TERM GOAL #1   Title She will be indpendent with all hEP issued    Baseline independent with initial hEP    Time 8    Period Weeks    Status New      PT LONG TERM GOAL #2   Title She will report pain decr 50% in Lt knee and able to walk 300 feet    Baseline 100 feet and pain 7/10 or higher    Time 8    Period Weeks    Status New      PT LONG TERM GOAL #3   Title she wil report 50% decreased RT shoulder pain with reaching and self care    Baseline 10/10 pain at eval and not able to self care  without modifications.    Time 8    Period Weeks    Status New      PT LONG TERM GOAL #4   Title she will improve shoulder flexion to 120 degrees to ease pain with self care    Baseline 105 deegrees at eval    Time 8    Period Weeks                  Plan - 12/08/19 1542    Clinical Impression Statement Ms Shiley presents post LT patella dislocation  months age with pain now and weakness and decr ROM . She is limited with walking and getting out of chair due to pain.  She also rpeorts chronic of at least 2 years RT shoulder pain from labral tear.  The pain is worsening.   She wonders about surgery for shouldre and knee . She has chronic bilateral knee pain and back pain also. She should imprve with skilled PT and this will most likely be slow due to other issues but if she does her HEP she should mprove.  .    Personal Factors and Comorbidities Fitness;Comorbidity 3+;Past/Current Experience;Time since onset of injury/illness/exacerbation;Transportation    Comorbidities DM, HTN, arthritis    Examination-Activity Limitations Bathing;Locomotion Level;Reach Overhead;Carry;Sleep;Dressing;Hygiene/Grooming    Examination-Participation Restrictions Meal Prep;Cleaning;Community Activity;Driving;Shop;Laundry      Stability/Clinical Decision Making Evolving/Moderate complexity    Clinical Decision Making Moderate    Rehab Potential Good    PT Frequency --   3 visits   PT Duration --   over 2-3 weeks then 2x/week for 6 weeks   PT Treatment/Interventions ADLs/Self Care Home Management;Cryotherapy;Moist Heat;Therapeutic activities;Neuromuscular re-education;Therapeutic exercise;Patient/family education;Manual techniques;Passive range of motion;Dry needling;Splinting;Taping;Joint Manipulations    PT Next Visit Plan Assess response to HEP, modalities PRN for pain, manual as indicated  to address joint restriction/ROM limitation    PT Home Exercise Plan LAQ, QS, heel slides, hp adduction, scap retraction and depression    Consulted and Agree with Plan of Care Patient         Check all possible CPT codes: 97110- Therapeutic Exercise, [phone]- Neuro Re-education, [phone] - Gait Training, [phone] - Manual Therapy, [phone] - Therapeutic Activities and [id number] - Electrical stimulation (unattended)        Check all possible CPT codes: [phone] - Vaso       Patient will benefit from skilled therapeutic intervention in order to improve the following deficits and impairments:  Pain, Postural dysfunction, Decreased activity tolerance, Decreased strength, Impaired UE functional use, Increased muscle spasms, Decreased range of motion, Difficulty walking  Visit Diagnosis: Muscle weakness (generalized)  Acute pain of left knee  Stiffness of right shoulder, not elsewhere classified  Chronic right shoulder pain  Closed dislocation of left patella, subsequent encounter  Difficulty in walking, not elsewhere classified     Problem List Patient Active Problem List   Diagnosis Date Noted  . Hypertension associated with type 2 diabetes mellitus ([place]) 11/10/2019  . Hyperlipidemia associated with type 2 diabetes mellitus ([place]) 11/10/2019  . Diabetes mellitus ([place]) 10/23/2019  . Hypertension associated with diabetes ([place])  10/23/2019  . Mixed diabetic hyperlipidemia associated with type 2 diabetes mellitus ([place]) 10/23/2019  . Vitamin D deficiency 10/23/2019  . B12 deficiency 10/23/2019  . Absolute anemia 10/23/2019  . BMI 50.0-59.9, adult ([place]) 10/23/2019  . Obesity, morbid, BMI 50 or higher ([place]) 06/16/2015  . Abdominal wall mass of suprapubic region 06/16/2015    Darrel Hoover  PT 12/08/2019, 3:52 PM  [facility] [address] [place], [place], [address] Phone: [phone]   Fax:  [phone]  Name: Jennifer Dickson MRN: [id number] Date of Birth: 1975/04/12

## 2019-12-08 NOTE — Patient Instructions (Signed)
Scapula retraction and depression x 10 1-2x/day bilatera hold 3-5 sec LT LAQ, QS, hip adduction squeeze, heel slide  X 10 1-2x/day  Hold 3-5 sec

## 2019-12-09 ENCOUNTER — Other Ambulatory Visit (INDEPENDENT_AMBULATORY_CARE_PROVIDER_SITE_OTHER): Payer: Self-pay | Admitting: Family Medicine

## 2019-12-09 DIAGNOSIS — E559 Vitamin D deficiency, unspecified: Secondary | ICD-10-CM

## 2019-12-25 ENCOUNTER — Ambulatory Visit: Payer: Medicaid Other

## 2019-12-31 ENCOUNTER — Other Ambulatory Visit: Payer: Self-pay

## 2019-12-31 ENCOUNTER — Encounter: Payer: Self-pay | Admitting: Acute Care

## 2019-12-31 ENCOUNTER — Ambulatory Visit (INDEPENDENT_AMBULATORY_CARE_PROVIDER_SITE_OTHER): Payer: Medicaid Other | Admitting: Acute Care

## 2019-12-31 VITALS — BP 128/84 | HR 71 | Temp 97.3°F | Ht 67.0 in | Wt 334.6 lb

## 2019-12-31 DIAGNOSIS — Z9989 Dependence on other enabling machines and devices: Secondary | ICD-10-CM

## 2019-12-31 DIAGNOSIS — J9811 Atelectasis: Secondary | ICD-10-CM

## 2019-12-31 DIAGNOSIS — E662 Morbid (severe) obesity with alveolar hypoventilation: Secondary | ICD-10-CM | POA: Diagnosis not present

## 2019-12-31 DIAGNOSIS — R0600 Dyspnea, unspecified: Secondary | ICD-10-CM | POA: Diagnosis not present

## 2019-12-31 DIAGNOSIS — G4733 Obstructive sleep apnea (adult) (pediatric): Secondary | ICD-10-CM | POA: Diagnosis not present

## 2019-12-31 DIAGNOSIS — R0609 Other forms of dyspnea: Secondary | ICD-10-CM

## 2019-12-31 MED ORDER — BREO ELLIPTA 100-25 MCG/INH IN AEPB: 1.0000 | INHALATION_SPRAY | Freq: Every day | RESPIRATORY_TRACT | 0 refills | Status: DC

## 2019-12-31 NOTE — Patient Instructions (Addendum)
It is good to see you today, We will order an Chiropodist.  Use this to see if we can improve the atelectasis noted in the bases of your lungs. There are good instruction videos on You tube. Place next to TV and use when commercials come on, several times an hour.  We will do a Therapeutic Trial with Breo Use 1 puff once daily Rinse mouth after use Call the office if this is helping, and we will phone in a prescription Continue using your Albuterol as rescue as you have been doing.  Do not exceed 3 times daily. If you need it more call to be seen. Follow up in 2 months with Judson Roch NP or Dr. Vaughan Browner Call if you need [place] sooner Follow up with your CPAP physician as you have been doing. Please contact office for sooner follow up if symptoms do not improve or worsen or seek emergency care

## 2019-12-31 NOTE — Progress Notes (Signed)
History of Present Illness Jennifer Dickson is a 44 y.o. 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 She is followed by Dr. Vaughan Browner   12/31/2019  Pt. Presents for follow up of dyspnea.She has had a cardiac work up which was negative for etiology of dyspnea. She recently had an MRI which showed some atelectasis in her lung bases.She wanted to follow up with [place] about this finding. She states she has had to use her rescue inhaler more frequently.She states she is using this 2-3 times daily and that  this has increased  since the weather has become colder.  She states she has had a cough, with mucus production. She states secretions are white to yellow. No fever.  We will do a therapeutic Trial of Breo to see if this helps. She had failed treatment with Symbicort, stating it gave her no benefit. Perhaps in the cold weather she will experience more benefit. She states she is compliant with her CPAP devise. This is managed by Neuro. We do not have access to down Load. She is followed by [facility]. She has lost 30 pounds. She needs to lose an additional 64 pounds before she can have her cervical stenosis surgery, knee and Right shoulder surgery. We discussed what atelectasis was, meaning partial or complete collapse of lung tissue.This causes decrease in surface area for oxygen exchange within the lung.  We discussed that gravity, increasing activity,  and using an Incentive Spirometer should help to re-inflate the collapsed alveoli. We dicussed using You tube videos to for instruction of proper use.She has agreed to give both a try.  Test Results: PFTs: 09/08/2019 FVC 2.81 [38%], FEV1 2.25 [71%], F/F 80, TLC 5.09 [87%], DLCO 34.16 [10 and 35%] Normal test Reduced ERV suggests effect of obesity  Labs: CBC 01/18/2019-WBC 6.7, eos 0%  Work up at [facility] Coronary angiogram  11/2018. No significant obstructive coronary disease. .  Echocardiogram 05/2018. Normal LV EF 60-65% with normal wall motion. Normal wall thickness. Normal diastolic function. No valvular heart disease.   Lexiscan Cardiolite SPECT study 07/2018.  Pharmacologic stress protocol performed with Lexiscan due to poor exercise tolerance. Normal left ventricular systolic function, LV ejection fraction 59%. No regional wall motion abnormality. Normal Cardiolite SPECT study. Medium size perfusion abnormality in the anterior and septal region. Probable artifact due to breast attenuation.  CT chest angio 10/2018. 1. No explanation for patient's persistent chest pain and shortness  of breath. Specifically, no evidence of pulmonary embolism.  2. Borderline cardiomegaly. Further evaluation cardiac echo could be  performed as indicated.  3. Suspected hepatic steatosis with mild nodularity hepatic contour  as could be seen in the setting of early cirrhotic change.  Correlation with LFTs is advised.     CBC Latest Ref Rng & Units 05/06/2019 01/18/2019 10/05/2018  WBC 4.0 - 10.5 K/uL 18.7(H) 6.7 11.7(H)  Hemoglobin 12.0 - 15.0 g/dL 13.0 12.9 11.8(L)  Hematocrit 36.0 - 46.0 % 43.6 43.3 39.3  Platelets 150 - 400 K/uL 555(H) 388 563(H)    BMP Latest Ref Rng & Units 05/06/2019 01/18/2019 10/05/2018  Glucose 70 - 99 mg/dL 144(H) 126(H) 138(H)  BUN 6 - 20 mg/dL 16 <5(L) 7  Creatinine 0.44 - 1.00 mg/dL 1.00 0.68 0.83  BUN/Creat Ratio 9 - 23 - - -  Sodium 135 - 145 mmol/L 137 137 137  Potassium 3.5 - 5.1  mmol/L 3.6 3.6 3.5  Chloride 98 - 111 mmol/L 96(L) 103 100  CO2 22 - 32 mmol/L 27 28 26   Calcium 8.9 - 10.3 mg/dL 9.7 8.7(L) 9.2    BNP    Component Value Date/Time   BNP 25.3 04/26/2019 1822    ProBNP No results found for: PROBNP  PFT    Component Value Date/Time   FEV1PRE 2.23 09/08/2019 1049   FEV1POST 2.25 09/08/2019 1049   FVCPRE 2.76 09/08/2019 1049   FVCPOST 2.81 09/08/2019 1049    TLC 5.09 09/08/2019 1049   DLCOUNC 34.16 09/08/2019 1049   PREFEV1FVCRT 81 09/08/2019 1049   PSTFEV1FVCRT 80 09/08/2019 1049    No results found.   Past medical hx Past Medical History:  Diagnosis Date   Anemia    Anxiety    Back pain    Carpal tunnel syndrome    Cervical radiculopathy    Cervical radiculopathy    Chest pain    Chronic fatigue syndrome    Constipation    COVID-19    Depression    Diabetes mellitus without complication ([name])    Diverticulosis    DJD (degenerative joint disease)    DVT (deep vein thrombosis) in pregnancy    DVT (deep venous thrombosis) ([name])    Endometrioma    Endometriosis    Fatty liver    Fibromyalgia    Fibromyalgia    Gallbladder problem    Heartburn    High blood pressure    High cholesterol    Hypertension    IBS (irritable bowel syndrome)    Interstitial cystitis    Joint pain    Knee dislocation    Mini stroke ([name])    Osteoarthritis    Osteoarthritis    Palpitations    Pinched nerve    Sleep apnea    SOB (shortness of breath) on exertion    Swallowing difficulty    Swelling of both lower extremities    Tendonitis    Varicose veins of both legs with edema    Vitamin D deficiency      Social History   Tobacco Use   Smoking status: Former Smoker    Years: 5.00    Quit date: 2007    Years since quitting: 14.9   Smokeless tobacco: Never Used  Scientific laboratory technician Use: Never used  Substance Use Topics   Alcohol use: No    Comment: None since 2013   Drug use: No    Ms.Dunker reports that she quit smoking about 14 years ago. She quit after 5.00 years of use. She has never used smokeless tobacco. She reports that she does not drink alcohol and does not use drugs.  Tobacco Cessation: Former smoker  Quit 2007 Smoked x 5 years, no packs per day documented.  Past surgical hx, Family hx, Social hx all reviewed.  Current Outpatient Medications on File Prior to  Visit  Medication Sig   albuterol (VENTOLIN HFA) 108 (90 Base) MCG/ACT inhaler Inhale 1 puff into the lungs every 4 (four) hours as needed.   amLODipine (NORVASC) 5 MG tablet Take 5 mg by mouth daily.   aspirin 81 MG EC tablet Take by mouth.   atorvastatin (LIPITOR) 20 MG tablet Take 1 tablet by mouth daily.   baclofen (LIORESAL) 10 MG tablet Take 10 mg by mouth every 8 (eight) hours as needed for muscle spasms.   buprenorphine (BUTRANS) 5 MCG/HR PTWK Place onto the skin.   carbamide peroxide (DEBROX) 6.5 %  OTIC solution Place 5 drops into both ears daily as needed (Ear wax removal).    cetirizine (ZYRTEC) 10 MG tablet Take 10 mg by mouth daily.   chlorthalidone (HYGROTON) 25 MG tablet Take 25 mg by mouth daily.   Cholecalciferol (VITAMIN D3 PO) Take 1 tablet by mouth daily.   conjugated estrogens (PREMARIN) vaginal cream Place vaginally.   Cyclobenzaprine HCl (FLEXERIL PO) Take by mouth.   diclofenac Sodium (VOLTAREN) 1 % GEL Apply 2 g topically 2 (two) times daily as needed (Joint pain).   esomeprazole (NEXIUM) 40 MG capsule Take 40 mg by mouth daily.   ferrous gluconate (FERGON) 324 MG tablet Take 324 mg by mouth every morning.   fluconazole (DIFLUCAN) 100 MG tablet Take 100 mg by mouth daily.    fluticasone (FLONASE) 50 MCG/ACT nasal spray Place 2 sprays into both nostrils daily as needed for allergies or rhinitis.    hydrOXYzine (ATARAX/VISTARIL) 25 MG tablet Take 25 mg by mouth 3 (three) times daily as needed for anxiety or itching.    ibuprofen (ADVIL) 600 MG tablet Take 600 mg by mouth every 6 (six) hours as needed for fever, headache or moderate pain.   JARDIANCE 10 MG TABS tablet Take 10 mg by mouth daily.   MAGNESIUM PO Take by mouth.   meclizine (ANTIVERT) 25 MG tablet Take 25 mg by mouth 3 (three) times daily as needed.    meloxicam (MOBIC) 15 MG tablet Take 15 mg by mouth daily.   montelukast (SINGULAIR) 10 MG tablet Take by mouth.   Multiple Vitamin  tablet Take 1 tablet by mouth daily.   nystatin (MYCOSTATIN/NYSTOP) powder Apply 1 g 4 (four) times daily as needed topically (Dry Skin).   nystatin cream (MYCOSTATIN) Apply 1 application 2 (two) times daily as needed topically for dry skin.   oxyCODONE (OXY IR/ROXICODONE) 5 MG immediate release tablet Take 5 mg by mouth every 8 (eight) hours as needed for severe pain.   Probiotic Product (UP4 PROBIOTICS WOMENS PO) Take 1 capsule by mouth daily.    promethazine (PHENERGAN) 25 MG tablet Take 1 tablet (25 mg total) every 6 (six) hours as needed by mouth for nausea or vomiting.   sodium chloride (OCEAN) 0.65 % nasal spray Place 1 spray into the nose daily as needed for congestion.    SUMAtriptan (IMITREX) 100 MG tablet Take 1 tablet (100 mg total) by mouth once as needed for migraine. May repeat in 2 hours if headache persists or recurs.   temazepam (RESTORIL) 15 MG capsule Take 15 mg by mouth at bedtime as needed.   tetrahydrozoline 0.05 % ophthalmic solution Place 2 drops into both eyes daily as needed (Dry eyes).   TRINTELLIX 10 MG TABS tablet Take 10 mg by mouth daily.   Vitamin D, Ergocalciferol, (DRISDOL) 1.25 MG (50000 UNIT) CAPS capsule Take 1 capsule (50,000 Units total) by mouth every 7 (seven) days.   amitriptyline (ELAVIL) 50 MG tablet Take by mouth.   No current facility-administered medications on file prior to visit.     Allergies  Allergen Reactions   Cyclobenzaprine Rash and Other (See Comments)    Also made heart race Also made heart race Other Reaction: rapid heart rate   Pregabalin Other (See Comments)    Heart Racing, coma like feeling and delusional   Shellfish Allergy Anaphylaxis   Amoxicillin-Pot Clavulanate Rash   Topiramate Other (See Comments)    Made headaches worse   Sumatriptan Palpitations    Review Of Systems:  Constitutional:  No  weight loss, night sweats,  Fevers, chills, fatigue, or  lassitude.  HEENT:   No headaches,  Difficulty  swallowing,  Tooth/dental problems, or  Sore throat,                No sneezing, itching, ear ache, nasal congestion, post nasal drip,   CV:  No chest pain,  Orthopnea, PND, swelling in lower extremities, anasarca, dizziness, palpitations, syncope.   GI  No heartburn, indigestion, abdominal pain, nausea, vomiting, diarrhea, change in bowel habits, loss of appetite, bloody stools.   Resp: + shortness of breath with exertion none  at rest.  No excess mucus, no productive cough,  No non-productive cough,  No coughing up of blood.  No change in color of mucus.  No wheezing.  No chest wall deformity  Skin: no rash or lesions.  GU: no dysuria, change in color of urine, no urgency or frequency.  No flank pain, no hematuria   MS:  No joint pain or swelling.  No decreased range of motion.  No back pain.  Psych:  No change in mood or affect. No depression or anxiety.  No memory loss.   Vital Signs BP 128/84 (BP Location: Left Arm, Cuff Size: Normal)    Pulse 71    Temp (!) 97.3 F (36.3 C) (Oral)    Ht 5\' 7"  (1.702 m)    Wt (!) 334 lb 9.6 oz (151.8 kg)    SpO2 99%    BMI 52.41 kg/m    Physical Exam:  General- No distress,  A&Ox3, appropriate ENT: No sinus tenderness, TM clear, pale nasal mucosa, no oral exudate,no post nasal drip, no LAN Cardiac: S1, S2, regular rate and rhythm, no murmur Chest: No wheeze/ rales/ dullness; no accessory muscle use, no nasal flaring, no sternal retractions Abd.: Soft Non-tender, ND, BS +, Body mass index is 52.41 kg/m. Ext: No clubbing cyanosis, + BLE edema, no obvious deformities noted Neuro:  normal strength, MAE x 4, A&O x 3 Skin: No rashes, No lesions, warm and dry Psych: normal mood and behavior   Assessment/Plan Dyspnea on exertion Plan Will re-try patient on maintenance therapy ( May be more effective in the winter since dyspnea is worse in the cold) Therapeutic Trial Breo 1 puff once daily Rinse mouth after use. Call the office if this is  helping, and we will phone in a prescription We will order an [place].  Use this to see if we can improve the atelectasis noted in the bases of your lungs. There are good instruction videos on You tube. Place next to TV and use when commercials come on, several times an hour.  Continue using your Albuterol as rescue as you have been doing.  Do not exceed 3 times daily. If you need it more call to be seen. Follow up in 2 months with Judson Roch NP or Dr. Vaughan Browner Call if you need [place] sooner Follow up with your CPAP physician as you have been doing for instructions and evaluation of benefit of therapy.. Please contact office for sooner follow up if symptoms do not improve or worsen or seek emergency care     Magdalen Spatz, NP 12/31/2019  3:49 PM

## 2020-01-01 ENCOUNTER — Ambulatory Visit: Payer: Medicaid Other

## 2020-01-08 ENCOUNTER — Other Ambulatory Visit: Payer: Self-pay

## 2020-01-08 ENCOUNTER — Ambulatory Visit: Payer: Medicaid Other | Attending: Physician Assistant

## 2020-01-08 DIAGNOSIS — G8929 Other chronic pain: Secondary | ICD-10-CM | POA: Diagnosis present

## 2020-01-08 DIAGNOSIS — M6281 Muscle weakness (generalized): Secondary | ICD-10-CM | POA: Diagnosis present

## 2020-01-08 DIAGNOSIS — M25611 Stiffness of right shoulder, not elsewhere classified: Secondary | ICD-10-CM | POA: Insufficient documentation

## 2020-01-08 DIAGNOSIS — S83005D Unspecified dislocation of left patella, subsequent encounter: Secondary | ICD-10-CM | POA: Diagnosis present

## 2020-01-08 DIAGNOSIS — M25562 Pain in left knee: Secondary | ICD-10-CM | POA: Diagnosis present

## 2020-01-08 DIAGNOSIS — M25511 Pain in right shoulder: Secondary | ICD-10-CM | POA: Diagnosis present

## 2020-01-08 DIAGNOSIS — R262 Difficulty in walking, not elsewhere classified: Secondary | ICD-10-CM | POA: Diagnosis present

## 2020-01-08 NOTE — Patient Instructions (Signed)
Aquatic Therapy: What to Expect!  Where:   [facility] [address] [place], [place] [address] Rehab phone [phone]  NOTE:  You will receive an automated phone message reminding you of your appt and it will say the appointment is at the [address] [facility] clinic.             How to Prepare: . Please make sure you drink 8 ounces of water about one hour prior to your pool session . A caregiver may attend if needed with the patient to help assist as needed. A caregiver can sit in the bleachers next to the pool. . Please arrive IN YOUR SUIT and 15 minutes prior to your appointment - this helps to avoid delays in starting your session. . Please make sure to attend to any toileting needs prior to entering the pool . Locker rooms for changing are located to the right of the check -in desk.  There is direct access to the pool deck form the locker room.  You can lock your belongings in a locker but you must bring you own lock. . Once on the pool deck your therapist will ask you to sign the Patient  Consent and Assignment of Benefits form . Your therapist may take your blood pressure prior to, during and after your session if indicated . We usually try and create a home exercise program based on activities we do in the pool.  Please be thinking about who might be able to assist you in the pool should you need to participate in an aquatic home exercise program at the time of discharge if you need assistance.  Some patients do not want to or do not have the ability to participate in an aquatic home program - this is not a barrier in any way to you participating in aquatic therapy as part of your current therapy plan! . After Discharge from PT, you can continue using home program at  the [facility], there is a drop-in fee for $5 ($45 a month)or for 60 years  or older $4.00 ($40 a month for seniors ) or any local [facility]  pool.    About the pool: 1. Entering the pool Your therapist will assist you; there are multiple ways to enter including stairs with railings, a walk in ramp, a roll in chair and a mechanical lift. Your therapist will determine the most appropriate way for you. 2. Water temperature is usually between 86-87 degrees 3. There may be other swimmers in the pool at the same time 4. There is availability at the pool for [employer] Info:             Appointments: Please call the [facility]  Rehab phone [phone]           Center if you need to cancel or reschedule an appointment at [facility]. Outpatient Rehabilitation @ [facility]         All sessions are 45 minutes [address] [place], [place] [address]

## 2020-01-08 NOTE — Therapy (Signed)
[facility] [address] [place], [place], [address] Phone: [phone]   Fax:  [phone]  Physical Therapy Treatment/Re-evaluation  Patient Details  Name: Jennifer Dickson MRN: [id number] Date of Birth: May 13, 1975 Referring Provider (PT): Frederico Hamman, MD   Encounter Date: 01/08/2020   PT End of Session - 01/08/20 1448    Visit Number 2    Number of Visits 16    Date for PT Re-Evaluation 02/21/20    Authorization Type Medicaid - submitted for re-auth 01/08/2020    PT Start Time 1449   pt arrived late   PT Stop Time 1530    PT Time Calculation (min) 41 min    Activity Tolerance Patient limited by pain    Behavior During Therapy [facility] for tasks assessed/performed           Past Medical History:  Diagnosis Date   Anemia    Anxiety    Back pain    Carpal tunnel syndrome    Cervical radiculopathy    Cervical radiculopathy    Chest pain    Chronic fatigue syndrome    Constipation    COVID-19    Depression    Diabetes mellitus without complication ([name])    Diverticulosis    DJD (degenerative joint disease)    DVT (deep vein thrombosis) in pregnancy    DVT (deep venous thrombosis) ([name])    Endometrioma    Endometriosis    Fatty liver    Fibromyalgia    Fibromyalgia    Gallbladder problem    Heartburn    High blood pressure    High cholesterol    Hypertension    IBS (irritable bowel syndrome)    Interstitial cystitis    Joint pain    Knee dislocation    Mini stroke ([name])    Osteoarthritis    Osteoarthritis    Palpitations    Pinched nerve    Sleep apnea    SOB (shortness of breath) on exertion    Swallowing difficulty    Swelling of both lower extremities    Tendonitis    Varicose veins of both legs with edema    Vitamin D deficiency     Past Surgical History:  Procedure Laterality Date   CESAREAN SECTION     CHOLECYSTECTOMY  1996    There were  no vitals filed for this visit.   Subjective Assessment - 01/08/20 1448    Subjective Patient reports that she was expecting there to be a referral for her neck pain from [facility] but there is no referral in her chart at this time. "Last night my L knee tried to dislocate again, and I was just sitting down. I wasn't wearing my brace because I try to give my leg a break sometimes."    Limitations House hold activities;Lifting;Writing    How long can you walk comfortably? 100-150 feet      Before she was able to walk 1-2 miles. (Last done a year ago. )    Diagnostic tests MRI :  still slight dislocation  9 done [facility])    Patient Stated Goals Get rid of pain    Currently in Pain? Yes    Pain Score 6     Pain Location Knee    Pain Orientation Left    Pain Descriptors / Indicators Throbbing;Sharp    Pain Type Chronic pain    Pain Onset More than a month ago    Pain Onset More  than a month ago              [facility] PT Assessment - 01/08/20 0001      Assessment   Medical Diagnosis LT patella dislocation    Referring Provider (PT) Frederico Hamman, MD      AROM   Right/Left Shoulder Right    Right Shoulder Flexion 118 Degrees    Right Shoulder ABduction 95 Degrees    Right Shoulder Internal Rotation --   slightly past R iliac crest   Right Shoulder External Rotation --   to C6   Right/Left Knee Right;Left    Right Knee Extension 5    Right Knee Flexion 110    Left Knee Extension -2    Left Knee Flexion 70      Strength   Right/Left Knee Right;Left    Right Knee Flexion 4+/5    Right Knee Extension 4+/5    Left Knee Flexion 4/5    Left Knee Extension 3/5   able to perform L knee active EXT but painful and weak                        [facility] Adult PT Treatment/Exercise - 01/08/20 0001      Self-Care   Self-Care Other Self-Care Comments    Other Self-Care Comments  HEP, McConnell tape and to remove if pt has increased pain or adverse effects,  aquatic therapy, POC, objective findings, use of brace as instructed and PRN for stability, getting out of electric wheelchair to walk regularly at home in safe area      Exercises   Exercises Knee/Hip      Knee/Hip Exercises: Seated   Long Arc Quad AROM;Strengthening;Left;2 sets;10 reps    Long Arc Quad Limitations Seated at [employer]    Heel Slides AAROM;2 sets;10 reps    Heel Slides Limitations Seated at EOM with L foot on towel      Knee/Hip Exercises: Supine   Quad Sets Limitations Attempted quad set in long sitting but pt with increased pain      Shoulder Exercises: Seated   Retraction Strengthening;Both;20 reps      Manual Therapy   Manual Therapy Joint mobilization;Taping    Joint Mobilization Gentle medial patellar mobs to tolerance    McConnell Lateral to medial taping                  PT Education - 01/08/20 1708    Education Details HEP, McConnell tape and to remove if pt has increased pain or adverse effects, aquatic therapy, POC, objective findings, use of brace as instructed and PRN for stability, getting out of electric wheelchair to walk regularly at home in safe area    Person(s) Educated Patient    Methods Explanation;Demonstration;Tactile cues;Verbal cues    Comprehension Verbalized understanding;Returned demonstration;Verbal cues required;Need further instruction;Tactile cues required            PT Short Term Goals - 01/08/20 1518      PT SHORT TERM GOAL #1   Title Pt will be independent and compliant with initial HEP.    Baseline no program    Time 3    Period Weeks    Status On-going      PT SHORT TERM GOAL #2   Title Pt will decrease pain by 25%  LT knee with walking    Baseline 7/10 pain or higher    Time 3    Period Weeks  Status On-going      PT SHORT TERM GOAL #3   Title She will decr RT shouldr pain to 8/10 generally    Baseline 10/10 today    Time 3    Period Weeks    Status On-going             PT Long Term Goals -  12/08/19 1539      PT LONG TERM GOAL #1   Title She will be indpendent with all hEP issued    Baseline independent with initial hEP    Time 8    Period Weeks    Status New      PT LONG TERM GOAL #2   Title She will report pain decr 50% in Lt knee and able to walk 300 feet    Baseline 100 feet and pain 7/10 or higher    Time 8    Period Weeks    Status New      PT LONG TERM GOAL #3   Title she wil report 50% decreased RT shoulder pain with reaching and self care    Baseline 10/10 pain at eval and not able to self care  without modifications.    Time 8    Period Weeks    Status New      PT LONG TERM GOAL #4   Title she will improve shoulder flexion to 120 degrees to ease pain with self care    Baseline 105 deegrees at eval    Time 8    Period Weeks                 Plan - 01/08/20 1449    Clinical Impression Statement Patient presents to [facility] for her first follow up visit since evaluation 12/08/2019. Pt ambulates to therapy gym using SPC with antalgic gait secondary to L knee pain and instability for which she has a brace donned. She demonstrates some improvement in active L knee FL and EXT since initial evaluation but continues to have significant pain and feeling that L patella is going to dislocate further. Pt expresses having cervical pain for which she discussed completing skilled PT for with her doctor but no referral noted at this time. She mentions that she continues to have R shoulder pain that is chronic (at least 2 years) from previous labral tear. Pt inquires about aquatic therapy and may benefit as it would allow for increased mobility and better tolerance with strengthening exercises. Pt primarily uses electric wheelchair at home and has difficulty walking. She should benefit from skilled PT intervention to increase LLE strength and allow for improved stability.    Personal Factors and Comorbidities Fitness;Comorbidity 3+;Past/Current Experience;Time since onset of  injury/illness/exacerbation;Transportation    Comorbidities DM, HTN, arthritis    Examination-Activity Limitations Bathing;Locomotion Level;Reach Overhead;Carry;Sleep;Dressing;Hygiene/Grooming    Examination-Participation Restrictions Meal Prep;Cleaning;Community Activity;Driving;Shop;Laundry    Stability/Clinical Decision Making Evolving/Moderate complexity    Rehab Potential Good    PT Frequency 2x / week   3 visits   PT Duration 6 weeks   over 2-3 weeks then 2x/week for 6 weeks   PT Treatment/Interventions ADLs/Self Care Home Management;Cryotherapy;Moist Heat;Therapeutic activities;Neuromuscular re-education;Therapeutic exercise;Patient/family education;Manual techniques;Passive range of motion;Dry needling;Splinting;Taping;Joint Manipulations    PT Next Visit Plan Assess response to HEP, modalities PRN for pain, manual as indicated to address joint restriction/ROM limitation, McConnell tape pending response from previous session, quad/LE strengthening    PT Home Exercise Plan LAQ, QS, heel slides, hip adduction, scap retraction and depression  Recommended Other Services Aquatic therapy    Consulted and Agree with Plan of Care Patient           Patient will benefit from skilled therapeutic intervention in order to improve the following deficits and impairments:  Pain,Postural dysfunction,Decreased activity tolerance,Decreased strength,Impaired UE functional use,Increased muscle spasms,Decreased range of motion,Difficulty walking  Visit Diagnosis: Muscle weakness (generalized) - Plan: PT plan of care cert/re-cert  Acute pain of left knee - Plan: PT plan of care cert/re-cert  Stiffness of right shoulder, not elsewhere classified - Plan: PT plan of care cert/re-cert  Chronic right shoulder pain - Plan: PT plan of care cert/re-cert  Closed dislocation of left patella, subsequent encounter - Plan: PT plan of care cert/re-cert  Difficulty in walking, not elsewhere classified - Plan: PT  plan of care cert/re-cert     Problem List Patient Active Problem List   Diagnosis Date Noted   Hypertension associated with type 2 diabetes mellitus ([place]) 11/10/2019   Hyperlipidemia associated with type 2 diabetes mellitus ([place]) 11/10/2019   Diabetes mellitus ([place]) 10/23/2019   Hypertension associated with diabetes ([place]) 10/23/2019   Mixed diabetic hyperlipidemia associated with type 2 diabetes mellitus ([place]) 10/23/2019   Vitamin D deficiency 10/23/2019   B12 deficiency 10/23/2019   Absolute anemia 10/23/2019   BMI 50.0-59.9, adult ([place]) 10/23/2019   Obesity, morbid, BMI 50 or higher ([place]) 06/16/2015   Abdominal wall mass of suprapubic region 06/16/2015      Haydee Monica, PT, DPT 01/08/20 5:22 PM  [facility] [address] [place], [place], [address] Phone: [phone]   Fax:  [phone]  Name: Lyrique Hakim MRN: [id number] Date of Birth: [birth year]

## 2020-01-12 ENCOUNTER — Telehealth: Payer: Self-pay

## 2020-01-12 ENCOUNTER — Ambulatory Visit: Payer: Medicaid Other | Attending: Physician Assistant

## 2020-01-12 DIAGNOSIS — M654 Radial styloid tenosynovitis [de Quervain]: Secondary | ICD-10-CM | POA: Insufficient documentation

## 2020-01-12 DIAGNOSIS — M6281 Muscle weakness (generalized): Secondary | ICD-10-CM | POA: Insufficient documentation

## 2020-01-12 DIAGNOSIS — M25611 Stiffness of right shoulder, not elsewhere classified: Secondary | ICD-10-CM | POA: Insufficient documentation

## 2020-01-12 DIAGNOSIS — M25512 Pain in left shoulder: Secondary | ICD-10-CM | POA: Insufficient documentation

## 2020-01-12 DIAGNOSIS — S83005D Unspecified dislocation of left patella, subsequent encounter: Secondary | ICD-10-CM | POA: Insufficient documentation

## 2020-01-12 DIAGNOSIS — M25562 Pain in left knee: Secondary | ICD-10-CM | POA: Insufficient documentation

## 2020-01-12 DIAGNOSIS — M25511 Pain in right shoulder: Secondary | ICD-10-CM | POA: Insufficient documentation

## 2020-01-12 DIAGNOSIS — M542 Cervicalgia: Secondary | ICD-10-CM | POA: Insufficient documentation

## 2020-01-12 DIAGNOSIS — R293 Abnormal posture: Secondary | ICD-10-CM | POA: Insufficient documentation

## 2020-01-12 DIAGNOSIS — M25531 Pain in right wrist: Secondary | ICD-10-CM | POA: Insufficient documentation

## 2020-01-12 DIAGNOSIS — G8929 Other chronic pain: Secondary | ICD-10-CM | POA: Insufficient documentation

## 2020-01-12 DIAGNOSIS — R252 Cramp and spasm: Secondary | ICD-10-CM | POA: Insufficient documentation

## 2020-01-12 DIAGNOSIS — R262 Difficulty in walking, not elsewhere classified: Secondary | ICD-10-CM | POA: Insufficient documentation

## 2020-01-12 NOTE — Telephone Encounter (Signed)
Patient forgot about scheduled PT appointment on 01/12/20. She is rescheduled for 01/13/20.

## 2020-01-13 ENCOUNTER — Other Ambulatory Visit (INDEPENDENT_AMBULATORY_CARE_PROVIDER_SITE_OTHER): Payer: Self-pay | Admitting: Family Medicine

## 2020-01-13 ENCOUNTER — Other Ambulatory Visit: Payer: Self-pay

## 2020-01-13 ENCOUNTER — Ambulatory Visit (INDEPENDENT_AMBULATORY_CARE_PROVIDER_SITE_OTHER): Payer: Medicaid Other | Admitting: Family Medicine

## 2020-01-13 ENCOUNTER — Ambulatory Visit: Payer: Medicaid Other

## 2020-01-13 VITALS — BP 110/79 | HR 93 | Temp 98.4°F | Ht 67.0 in | Wt 346.0 lb

## 2020-01-13 DIAGNOSIS — R293 Abnormal posture: Secondary | ICD-10-CM | POA: Diagnosis present

## 2020-01-13 DIAGNOSIS — E1169 Type 2 diabetes mellitus with other specified complication: Secondary | ICD-10-CM

## 2020-01-13 DIAGNOSIS — Z6841 Body Mass Index (BMI) 40.0 and over, adult: Secondary | ICD-10-CM | POA: Diagnosis not present

## 2020-01-13 DIAGNOSIS — M25562 Pain in left knee: Secondary | ICD-10-CM | POA: Diagnosis present

## 2020-01-13 DIAGNOSIS — E559 Vitamin D deficiency, unspecified: Secondary | ICD-10-CM

## 2020-01-13 DIAGNOSIS — M542 Cervicalgia: Secondary | ICD-10-CM | POA: Diagnosis present

## 2020-01-13 DIAGNOSIS — M25611 Stiffness of right shoulder, not elsewhere classified: Secondary | ICD-10-CM | POA: Diagnosis present

## 2020-01-13 DIAGNOSIS — R262 Difficulty in walking, not elsewhere classified: Secondary | ICD-10-CM

## 2020-01-13 DIAGNOSIS — M6281 Muscle weakness (generalized): Secondary | ICD-10-CM

## 2020-01-13 DIAGNOSIS — R252 Cramp and spasm: Secondary | ICD-10-CM | POA: Diagnosis present

## 2020-01-13 DIAGNOSIS — G8929 Other chronic pain: Secondary | ICD-10-CM

## 2020-01-13 DIAGNOSIS — S83005D Unspecified dislocation of left patella, subsequent encounter: Secondary | ICD-10-CM | POA: Diagnosis present

## 2020-01-13 DIAGNOSIS — M25511 Pain in right shoulder: Secondary | ICD-10-CM | POA: Diagnosis present

## 2020-01-13 DIAGNOSIS — M25531 Pain in right wrist: Secondary | ICD-10-CM | POA: Diagnosis present

## 2020-01-13 DIAGNOSIS — M25512 Pain in left shoulder: Secondary | ICD-10-CM | POA: Diagnosis present

## 2020-01-13 DIAGNOSIS — M654 Radial styloid tenosynovitis [de Quervain]: Secondary | ICD-10-CM | POA: Diagnosis present

## 2020-01-13 MED ORDER — VITAMIN D (ERGOCALCIFEROL) 1.25 MG (50000 UNIT) PO CAPS: 50000.0000 [IU] | ORAL_CAPSULE | ORAL | 0 refills | Status: DC

## 2020-01-13 NOTE — Therapy (Signed)
[facility], [place], [address] Phone: [phone]   Fax:  [phone]  Physical Therapy Treatment  Patient Details  Name: Jennifer Dickson MRN: [id number] Date of Birth: November 16, 1975 Referring Provider (PT): Earlie Server, MD   Encounter Date: 01/13/2020   PT End of Session - 01/13/20 1004    Visit Number 3    Number of Visits 16    Date for PT Re-Evaluation 02/21/20    Authorization Type Medicaid - submitted for re-auth 01/08/2020    PT Start Time 1005   Pt arrived late   PT Stop Time 1043    PT Time Calculation (min) 38 min    Activity Tolerance Patient limited by pain    Behavior During Therapy [facility] for tasks assessed/performed           Past Medical History:  Diagnosis Date  . Anemia   . Anxiety   . Back pain   . Carpal tunnel syndrome   . Cervical radiculopathy   . Cervical radiculopathy   . Chest pain   . Chronic fatigue syndrome   . Constipation   . COVID-19   . Depression   . Diabetes mellitus without complication ([place])   . Diverticulosis   . DJD (degenerative joint disease)   . DVT (deep vein thrombosis) in pregnancy   . DVT (deep venous thrombosis) ([place])   . Endometrioma   . Endometriosis   . Fatty liver   . Fibromyalgia   . Fibromyalgia   . Gallbladder problem   . Heartburn   . High blood pressure   . High cholesterol   . Hypertension   . IBS (irritable bowel syndrome)   . Interstitial cystitis   . Joint pain   . Knee dislocation   . Mini stroke ([place])   . Osteoarthritis   . Osteoarthritis   . Palpitations   . Pinched nerve   . Sleep apnea   . SOB (shortness of breath) on exertion   . Swallowing difficulty   . Swelling of both lower extremities   . Tendonitis   . Varicose veins of both legs with edema   . Vitamin D deficiency     Past Surgical History:  Procedure Laterality Date  . CESAREAN SECTION    . CHOLECYSTECTOMY  1996    There were no vitals filed  for this visit.   Subjective Assessment - 01/13/20 1008    Subjective Patient reports having 10/10 pain last night in L knee and 9/10 pain upon arrival. She states that her R shoulder hurts but is not as bad as her knee. She explains that she experienced some instability in her R knee as well over the weekend.    Limitations House hold activities;Lifting;Writing    How long can you walk comfortably? 100-150 feet      Before she was able to walk 1-2 miles. (Last done a year ago. )    Diagnostic tests MRI :  still slight dislocation  9 done [facility])    Patient Stated Goals Get rid of pain    Currently in Pain? Yes    Pain Score 9     Pain Location Knee    Pain Orientation Left;Anterior;Medial;Lateral    Pain Descriptors / Indicators Throbbing;Sharp    Pain Type Chronic pain    Pain Onset More than a month ago    Pain Score 7    Pain Location Shoulder    Pain Orientation Right;Anterior;Posterior  Pain Descriptors / Indicators Sharp    Pain Onset More than a month ago              [facility] PT Assessment - 01/13/20 0001      Assessment   Medical Diagnosis LT patella dislocation    Referring Provider (PT) Frederico Hamman, MD                         [facility] Adult PT Treatment/Exercise - 01/13/20 0001      Self-Care   Self-Care Other Self-Care Comments    Other Self-Care Comments  HEP, McConnell tape and to remove if pt has increased pain or adverse effects, POC, walking regularly, self-STM and myofascial release, stretching as tolerated. Provided tennis ball and small brown roller for pt.      Knee/Hip Exercises: Stretches   Passive Hamstring Stretch Limitations 2 x 30 sec L HS stretch with forward trunk lean while seated at EOM      Knee/Hip Exercises: Seated   Long Arc Quad AROM;Strengthening;Left;2 sets;10 reps    Long Arc Quad Limitations Seated at [employer] while seated at [employer]      Knee/Hip Exercises: Sidelying   Other Sidelying Knee/Hip  Exercises Attempted sidelying clamshell and L quad stretch but pt unable to tolerate knee FL even > 50 deg while in SL today due to increased pain      Manual Therapy   Manual Therapy Joint mobilization;Taping;Soft tissue mobilization;Myofascial release    Joint Mobilization Gentle medial patellar mobs to tolerance    Soft tissue mobilization STM, myofascial release, and IASTM along L quads, IT band, TFL, and medial HS to tolerance.    McConnell Lateral to medial taping                  PT Education - 01/13/20 1416    Education Details HEP, McConnell tape and to remove if pt has increased pain or adverse effects, POC, walking regularly, self-STM and myofascial release, stretching as tolerated. Provided tennis ball and small brown roller for pt.    Person(s) Educated Patient    Methods Explanation;Demonstration;Tactile cues;Verbal cues    Comprehension Verbalized understanding;Returned demonstration;Verbal cues required;Tactile cues required;Need further instruction            PT Short Term Goals - 01/08/20 1518      PT SHORT TERM GOAL #1   Title Pt will be independent and compliant with initial HEP.    Baseline no program    Time 3    Period Weeks    Status On-going      PT SHORT TERM GOAL #2   Title Pt will decrease pain by 25%  LT knee with walking    Baseline 7/10 pain or higher    Time 3    Period Weeks    Status On-going      PT SHORT TERM GOAL #3   Title She will decr RT shouldr pain to 8/10 generally    Baseline 10/10 today    Time 3    Period Weeks    Status On-going             PT Long Term Goals - 12/08/19 1539      PT LONG TERM GOAL #1   Title She will be indpendent with all hEP issued    Baseline independent with initial hEP    Time 8    Period Weeks  Status New      PT LONG TERM GOAL #2   Title She will report pain decr 50% in Lt knee and able to walk 300 feet    Baseline 100 feet and pain 7/10 or higher    Time 8    Period Weeks     Status New      PT LONG TERM GOAL #3   Title she wil report 50% decreased RT shoulder pain with reaching and self care    Baseline 10/10 pain at eval and not able to self care  without modifications.    Time 8    Period Weeks    Status New      PT LONG TERM GOAL #4   Title she will improve shoulder flexion to 120 degrees to ease pain with self care    Baseline 105 deegrees at eval    Time 8    Period Weeks                 Plan - 01/13/20 1010    Clinical Impression Statement Patient limited this session due to increased pain and poor tolerance in supine and standing positions. Attempted sidelying clamshells and quad stretch but pt unable to tolerate knee FL due to increased pain. Pt demonstrates increased hesistancy with motions secondary to frequent L patellar subluxations that limits her as well. She was able to tolerate light STM and some myofascial release/IASTM but continues to present with tightness in L TFL and along IT band and vastus lateralis. McConnell tape applied today with medial glide of L patella. She will have an aquatic therapy session next week that should allow for improved tolerance with standing and strengthening activities.    Personal Factors and Comorbidities Fitness;Comorbidity 3+;Past/Current Experience;Time since onset of injury/illness/exacerbation;Transportation    Comorbidities DM, HTN, arthritis    Examination-Activity Limitations Bathing;Locomotion Level;Reach Overhead;Carry;Sleep;Dressing;Hygiene/Grooming    Examination-Participation Restrictions Meal Prep;Cleaning;Community Activity;Driving;Shop;Laundry    Stability/Clinical Decision Making Evolving/Moderate complexity    Rehab Potential Good    PT Frequency 2x / week   3 visits   PT Duration 6 weeks   over 2-3 weeks then 2x/week for 6 weeks   PT Treatment/Interventions ADLs/Self Care Home Management;Cryotherapy;Moist Heat;Therapeutic activities;Neuromuscular re-education;Therapeutic  exercise;Patient/family education;Manual techniques;Passive range of motion;Dry needling;Splinting;Taping;Joint Manipulations    PT Next Visit Plan Assess response to HEP, modalities PRN for pain, manual as indicated to address joint restriction/ROM limitation, McConnell tape, quad/LE strengthening    PT Home Exercise Plan LAQ, QS, heel slides, hip adduction, scap retraction and depression    Consulted and Agree with Plan of Care Patient           Patient will benefit from skilled therapeutic intervention in order to improve the following deficits and impairments:  Pain,Postural dysfunction,Decreased activity tolerance,Decreased strength,Impaired UE functional use,Increased muscle spasms,Decreased range of motion,Difficulty walking  Visit Diagnosis: Muscle weakness (generalized)  Acute pain of left knee  Stiffness of right shoulder, not elsewhere classified  Chronic right shoulder pain  Closed dislocation of left patella, subsequent encounter  Difficulty in walking, not elsewhere classified     Problem List Patient Active Problem List   Diagnosis Date Noted  . Hypertension associated with type 2 diabetes mellitus ([place]) 11/10/2019  . Hyperlipidemia associated with type 2 diabetes mellitus ([place]) 11/10/2019  . Diabetes mellitus ([place]) 10/23/2019  . Hypertension associated with diabetes ([place]) 10/23/2019  . Mixed diabetic hyperlipidemia associated with type 2 diabetes mellitus ([place]) 10/23/2019  . Vitamin D deficiency 10/23/2019  .  B12 deficiency 10/23/2019  . Absolute anemia 10/23/2019  . BMI 50.0-59.9, adult ([place]) 10/23/2019  . Obesity, morbid, BMI 50 or higher ([place]) 06/16/2015  . Abdominal wall mass of suprapubic region 06/16/2015     Haydee Monica, PT, DPT 01/13/20 2:29 PM  [facility] [address] [place], [place], [address] Phone: [phone]   Fax:  [phone]  Name: Kamillah Devivo MRN: [id number] Date of  Birth: 11/03/1975

## 2020-01-14 ENCOUNTER — Telehealth: Payer: Self-pay | Admitting: Acute Care

## 2020-01-14 DIAGNOSIS — R0600 Dyspnea, unspecified: Secondary | ICD-10-CM

## 2020-01-14 DIAGNOSIS — J9811 Atelectasis: Secondary | ICD-10-CM

## 2020-01-14 DIAGNOSIS — R0609 Other forms of dyspnea: Secondary | ICD-10-CM

## 2020-01-14 MED ORDER — BREO ELLIPTA 100-25 MCG/INH IN AEPB: 1.0000 | INHALATION_SPRAY | Freq: Every day | RESPIRATORY_TRACT | 11 refills | Status: DC

## 2020-01-14 NOTE — Telephone Encounter (Signed)
Spoke with the pt and notified that rx for breo will be sent to the pharmacy and IS to DME  I have sent these both in  Nothing further needed

## 2020-01-14 NOTE — Telephone Encounter (Signed)
Called and spoke with patient who states that trial of Virgel Bouquet has been working well for her and she would like a prescription sent to her pharmacy. She also states that she has not gotten Facilities manager and that her DME told her that they don't provide those. Don't see that an order was placed for one. May need to place order so it can be sent somewhere for patient.  Sarah please advise on RX for [facility] and if you would like order placed for [employer]

## 2020-01-14 NOTE — Telephone Encounter (Signed)
OK to send in prescription for  Breo, and order IS for patient. Thanks so much

## 2020-01-15 ENCOUNTER — Other Ambulatory Visit: Payer: Self-pay | Admitting: Acute Care

## 2020-01-15 ENCOUNTER — Ambulatory Visit: Payer: Medicaid Other | Admitting: Pulmonary Disease

## 2020-01-15 MED ORDER — BREO ELLIPTA 100-25 MCG/INH IN AEPB: 1.0000 | INHALATION_SPRAY | Freq: Every day | RESPIRATORY_TRACT | 11 refills | Status: DC

## 2020-01-15 NOTE — Progress Notes (Signed)
Chief Complaint:   OBESITY Kellan is here to discuss her progress with her obesity treatment plan along with follow-up of her obesity related diagnoses. Darinka is on the Category 3 Plan with breakfast options and states she is following her eating plan approximately 0% of the time. Salice states she is doing physical therapy 60 minutes 2 times per week.  Today's visit was #: 4 Starting weight: 359 lbs Starting date: 10/23/2019 Today's weight: 346 lbs Today's date: 01/13/2020 Total lbs lost to date: 13 lbs Total lbs lost since last in-office visit: +6 lbs Total weight loss percentage to date: -3.62%  Interim History:  - Suzane's last OV was 11/24/2019. She was told to follow up in 2 weeks.  Patient lost to follow up as planned.   She was recently seen by her PCP on 01/07/2020. Notes reviewed and with an A1c of 8.0 recently, her PCP started her on Trulicity. Natalia will pick it up today.   During the holidays she was not on the plan and will restart the program tomorrow.   Assessment/Plan:   1. Vitamin D deficiency Lindsey's Vitamin D level was 28.3 on 10/23/2019. She is currently taking prescription vitamin D 50,000 IU each week. She denies nausea, vomiting or muscle weakness.   Ref. Range 10/23/2019 12:16  Vitamin D, 25-Hydroxy Latest Ref Range: 30.0 - 100.0 ng/mL 28.3 (L)   Plan:  Refill Vit D for 1 month, as per below. - Reiterated importance of vitamin D (as well as calcium) to their health and wellbeing.  - Reminded Sharmon Leyden that weight loss will likely improve availability of vitamin D, thus encouraged her to continue with meal plan and their weight loss efforts to further improve this condition. - I recommend patient continue to take weekly prescription vit D 50,000 IU - Informed patient this may be a lifelong thing, and she was encouraged to continue to take the medicine until told otherwise.   - we will need to monitor levels regularly (every 3-4 mo  on average) to keep levels within normal limits.  - weight loss will likely improve availability of vitamin D, thus encouraged Nephateria to continue with meal plan and their weight loss efforts to further improve this condition - pt's questions and concerns regarding this condition addressed.  Refill- Vitamin D, Ergocalciferol, (DRISDOL) 1.25 MG (50000 UNIT) CAPS capsule; Take 1 capsule (50,000 Units total) by mouth every 7 (seven) days.  Dispense: 4 capsule; Refill: 0    2. Type 2 diabetes mellitus with other specified complication, without long-term current use of insulin ([place]) Savannha is not checking her blood sugar at home. She didn't have the supplies to do so. She is on [place] and will start Trulicity today.  Plan: Check blood sugars as advised by PCP. Vernonica will start Trulicity but has questions about the medication, thus advised/counseled pt about the risks/benefits and mechanism of action, etc.   Continue prudent nutritional plan, decrease simple sugars.  - Patient given extra handouts on her Category 3 meal plan and grocery list.  - Patient counseled on the importance of an A1c <7.0.   3. Class 3 severe obesity with serious comorbidity and body mass index (BMI) of 50.0 to 59.9 in adult, unspecified obesity type ([place]) Bijan is currently in the action stage of change. As such, her goal is to continue with weight loss efforts. She has agreed to the Category 3 Plan with breakfast options.   Exercise goals: As is  Behavioral modification strategies:  increasing lean protein intake, decreasing simple carbohydrates, meal planning and cooking strategies, keeping healthy foods in the home, avoiding temptations and planning for success.   Patrisia has agreed to follow-up with our clinic in 2 weeks with me or someone else, as it is more important to been seen in 2 weeks than to be seen by me.   She was informed of the importance of frequent follow-up visits to maximize her success with  intensive lifestyle modifications for her multiple health conditions.    Objective:   Blood pressure 110/79, pulse 93, temperature 98.4 F (36.9 C), height 5\' 7"  (1.702 m), weight (!) 346 lb (156.9 kg), SpO2 97 %. Body mass index is 54.19 kg/m.  General: Cooperative, alert, well developed, in no acute distress. HEENT: Conjunctivae and lids unremarkable. Cardiovascular: Regular rhythm.  Lungs: Normal work of breathing. Neurologic: No focal deficits.   Lab Results  Component Value Date   CREATININE 1.00 05/06/2019   BUN 16 05/06/2019   NA 137 05/06/2019   K 3.6 05/06/2019   CL 96 (L) 05/06/2019   CO2 27 05/06/2019   Lab Results  Component Value Date   ALT 49 (H) 05/06/2019   AST 38 05/06/2019   ALKPHOS 112 05/06/2019   BILITOT 0.7 05/06/2019   No results found for: HGBA1C Lab Results  Component Value Date   INSULIN 45.1 (H) 10/23/2019   Lab Results  Component Value Date   TSH 1.710 10/23/2019   No results found for: CHOL, HDL, LDLCALC, LDLDIRECT, TRIG, CHOLHDL Lab Results  Component Value Date   WBC 18.7 (H) 05/06/2019   HGB 13.0 05/06/2019   HCT 43.6 05/06/2019   MCV 83.0 05/06/2019   PLT 555 (H) 05/06/2019    Attestation Statements:   Reviewed by clinician on day of visit: allergies, medications, problem list, medical history, surgical history, family history, social history, and previous encounter notes.  05/08/2019, am acting as Edmund Hilda for Energy manager, DO.  I have reviewed the above documentation for accuracy and completeness, and I agree with the above. [employer], D.O.  The 21st Century Cures Act was signed into law in 2016 which includes the topic of electronic health records.  This provides immediate access to information in MyChart.  This includes consultation notes, operative notes, office notes, lab results and pathology reports.  If you have any questions about what you read please let 2017 know at your next visit so  we can discuss your concerns and take corrective action if need be.  We are right here with you.

## 2020-01-17 ENCOUNTER — Other Ambulatory Visit: Payer: Self-pay

## 2020-01-17 ENCOUNTER — Encounter: Payer: Self-pay | Admitting: Rehabilitative and Restorative Service Providers"

## 2020-01-17 ENCOUNTER — Ambulatory Visit: Payer: Medicaid Other | Admitting: Rehabilitative and Restorative Service Providers"

## 2020-01-17 DIAGNOSIS — M542 Cervicalgia: Secondary | ICD-10-CM

## 2020-01-17 DIAGNOSIS — M6281 Muscle weakness (generalized): Secondary | ICD-10-CM

## 2020-01-17 DIAGNOSIS — M25611 Stiffness of right shoulder, not elsewhere classified: Secondary | ICD-10-CM

## 2020-01-17 DIAGNOSIS — M25562 Pain in left knee: Secondary | ICD-10-CM

## 2020-01-17 DIAGNOSIS — R252 Cramp and spasm: Secondary | ICD-10-CM

## 2020-01-17 DIAGNOSIS — R293 Abnormal posture: Secondary | ICD-10-CM

## 2020-01-17 DIAGNOSIS — M25512 Pain in left shoulder: Secondary | ICD-10-CM

## 2020-01-17 DIAGNOSIS — G8929 Other chronic pain: Secondary | ICD-10-CM

## 2020-01-17 DIAGNOSIS — R262 Difficulty in walking, not elsewhere classified: Secondary | ICD-10-CM

## 2020-01-17 DIAGNOSIS — S83005D Unspecified dislocation of left patella, subsequent encounter: Secondary | ICD-10-CM

## 2020-01-17 DIAGNOSIS — M25531 Pain in right wrist: Secondary | ICD-10-CM

## 2020-01-17 DIAGNOSIS — M654 Radial styloid tenosynovitis [de Quervain]: Secondary | ICD-10-CM

## 2020-01-17 NOTE — Therapy (Signed)
[place], [place], [address] Phone: [phone]   Fax:  [phone]  Physical Therapy Treatment  Patient Details  Name: Jennifer Dickson MRN: [id number] Date of Birth: 1975/01/11 Referring Provider (PT): Earlie Server, MD   Encounter Date: 01/17/2020   PT End of Session - 01/17/20 1002    Visit Number 4    Number of Visits 16    Date for PT Re-Evaluation 02/21/20    PT Start Time 0951    PT Stop Time 1036    PT Time Calculation (min) 45 min    Activity Tolerance Patient limited by pain;Patient tolerated treatment well    Behavior During Therapy [facility] for tasks assessed/performed           Past Medical History:  Diagnosis Date  . Anemia   . Anxiety   . Back pain   . Carpal tunnel syndrome   . Cervical radiculopathy   . Cervical radiculopathy   . Chest pain   . Chronic fatigue syndrome   . Constipation   . COVID-19   . Depression   . Diabetes mellitus without complication ([place])   . Diverticulosis   . DJD (degenerative joint disease)   . DVT (deep vein thrombosis) in pregnancy   . DVT (deep venous thrombosis) ([place])   . Endometrioma   . Endometriosis   . Fatty liver   . Fibromyalgia   . Fibromyalgia   . Gallbladder problem   . Heartburn   . High blood pressure   . High cholesterol   . Hypertension   . IBS (irritable bowel syndrome)   . Interstitial cystitis   . Joint pain   . Knee dislocation   . Mini stroke ([place])   . Osteoarthritis   . Osteoarthritis   . Palpitations   . Pinched nerve   . Sleep apnea   . SOB (shortness of breath) on exertion   . Swallowing difficulty   . Swelling of both lower extremities   . Tendonitis   . Varicose veins of both legs with edema   . Vitamin D deficiency     Past Surgical History:  Procedure Laterality Date  . CESAREAN SECTION    . CHOLECYSTECTOMY  1996    There were no vitals filed for this visit.   Subjective Assessment -  01/17/20 0954    Subjective I am no longer doing the aquatic therapy next week due to the pool not being open. My knee is hurting but both hurt all over. The tape helps but my knee throbs when it is on but it is helping.    Currently in Pain? Yes    Pain Score 7     Pain Location Knee    Pain Orientation Left;Anterior;Medial;Lateral    Pain Descriptors / Indicators Throbbing    Pain Type Chronic pain    Pain Frequency Constant    Multiple Pain Sites Yes    Pain Score 7    Pain Location Shoulder    Pain Orientation Right    Pain Descriptors / Indicators Sharp    Pain Type Chronic pain                             [facility] Adult PT Treatment/Exercise - 01/17/20 0001      Knee/Hip Exercises: Seated   Other Seated Knee/Hip Exercises seated EOM hip flexion x 20, LAQ x 10, bil DF x 20, bil  PF x 20, hip abdct/adduction from neutral hip motion at rest laterally back to neutral x 15; LAQ/ankle PF/DF combo x 10; pressing down gently into deflated ball doing limited AROM hamstring curls x 20      Shoulder Exercises: Seated   Other Seated Exercises scapular retract x 20; retract iso with shoulder flex/ext bil limited AROM x 15; scap retract bil with bil ER from neutral to slightly ER to pain tolerance x 15; seated R shoulder arm slide in and out seated with arm on bed; alternating shoulder punch with iso scap retract x 15 each; unweighted bicep curls x 15; retract with arms moving from crossed position in front of umbilicus to shoulder blade squeeze limited ROM      Manual Therapy   McConnell lateral to medial put on in sitting with knee in slight flexion                    PT Short Term Goals - 01/17/20 1038      PT SHORT TERM GOAL #1   Title Pt will be independent and compliant with initial HEP.    Status On-going      PT SHORT TERM GOAL #2   Title Pt will decrease pain by 25%  LT knee with walking    Status On-going      PT SHORT TERM GOAL #3   Title She will  decr RT shouldr pain to 8/10 generally    Status On-going             PT Long Term Goals - 12/08/19 1539      PT LONG TERM GOAL #1   Title She will be indpendent with all hEP issued    Baseline independent with initial hEP    Time 8    Period Weeks    Status New      PT LONG TERM GOAL #2   Title She will report pain decr 50% in Lt knee and able to walk 300 feet    Baseline 100 feet and pain 7/10 or higher    Time 8    Period Weeks    Status New      PT LONG TERM GOAL #3   Title she wil report 50% decreased RT shoulder pain with reaching and self care    Baseline 10/10 pain at eval and not able to self care  without modifications.    Time 8    Period Weeks    Status New      PT LONG TERM GOAL #4   Title she will improve shoulder flexion to 120 degrees to ease pain with self care    Baseline 105 deegrees at eval    Time 8    Period Weeks                 Plan - 01/17/20 1003    Clinical Impression Statement Pt presents to PT with L knee pain and R shoulder pain which limits mobility. Pt reports aquatic therapy was cancelled due to facility closure and not being interested in re-exploring until the summer when it is warmer. Pt would benefit from further PT to address L knee pain and L LE weakness and R shoulder pain and limitations. all exercises done to pt tolerance.    PT Treatment/Interventions ADLs/Self Care Home Management;Cryotherapy;Moist Heat;Therapeutic activities;Neuromuscular re-education;Therapeutic exercise;Patient/family education;Manual techniques;Passive range of motion;Dry needling;Splinting;Taping;Joint Manipulations    PT Next Visit Plan modalities PRN for pain, manual as indicated  to address joint restriction/ROM limitation, McConnell tape, quad/LE strengthening    Consulted and Agree with Plan of Care Patient           Patient will benefit from skilled therapeutic intervention in order to improve the following deficits and impairments:   Pain,Postural dysfunction,Decreased activity tolerance,Decreased strength,Impaired UE functional use,Increased muscle spasms,Decreased range of motion,Difficulty walking  Visit Diagnosis: Muscle weakness (generalized)  Difficulty in walking, not elsewhere classified  Acute pain of left knee  Pain in right wrist  Cramp and spasm  Cervicalgia  Stiffness of right shoulder, not elsewhere classified  Radial styloid tenosynovitis (de quervain)  Abnormal posture  Chronic right shoulder pain  Closed dislocation of left patella, subsequent encounter  Chronic left shoulder pain     Problem List Patient Active Problem List   Diagnosis Date Noted  . Hypertension associated with type 2 diabetes mellitus ([place]) 11/10/2019  . Hyperlipidemia associated with type 2 diabetes mellitus ([place]) 11/10/2019  . Diabetes mellitus ([place]) 10/23/2019  . Hypertension associated with diabetes ([place]) 10/23/2019  . Mixed diabetic hyperlipidemia associated with type 2 diabetes mellitus ([place]) 10/23/2019  . Vitamin D deficiency 10/23/2019  . B12 deficiency 10/23/2019  . Absolute anemia 10/23/2019  . BMI 50.0-59.9, adult ([place]) 10/23/2019  . Obesity, morbid, BMI 50 or higher ([place]) 06/16/2015  . Abdominal wall mass of suprapubic region 06/16/2015    Myra Rude, PT 01/17/2020, 10:39 AM  [place], [place], [address] Phone: [phone]   Fax:  [phone]  Name: Jennifer Dickson MRN: [id number] Date of Birth: [birth year]

## 2020-01-21 ENCOUNTER — Encounter (INDEPENDENT_AMBULATORY_CARE_PROVIDER_SITE_OTHER): Payer: Self-pay

## 2020-01-21 ENCOUNTER — Ambulatory Visit: Payer: Medicaid Other | Admitting: Physical Therapy

## 2020-01-22 ENCOUNTER — Other Ambulatory Visit (INDEPENDENT_AMBULATORY_CARE_PROVIDER_SITE_OTHER): Payer: Self-pay | Admitting: Family Medicine

## 2020-01-22 DIAGNOSIS — E559 Vitamin D deficiency, unspecified: Secondary | ICD-10-CM

## 2020-01-24 ENCOUNTER — Ambulatory Visit: Payer: Medicaid Other | Admitting: Rehabilitative and Restorative Service Providers"

## 2020-01-26 ENCOUNTER — Encounter (INDEPENDENT_AMBULATORY_CARE_PROVIDER_SITE_OTHER): Payer: Self-pay | Admitting: Family Medicine

## 2020-01-26 ENCOUNTER — Telehealth (INDEPENDENT_AMBULATORY_CARE_PROVIDER_SITE_OTHER): Payer: Medicaid Other | Admitting: Family Medicine

## 2020-01-26 ENCOUNTER — Other Ambulatory Visit: Payer: Self-pay

## 2020-01-26 VITALS — Wt 337.0 lb

## 2020-01-26 DIAGNOSIS — E559 Vitamin D deficiency, unspecified: Secondary | ICD-10-CM | POA: Diagnosis not present

## 2020-01-26 DIAGNOSIS — Z6841 Body Mass Index (BMI) 40.0 and over, adult: Secondary | ICD-10-CM

## 2020-01-26 DIAGNOSIS — E1169 Type 2 diabetes mellitus with other specified complication: Secondary | ICD-10-CM | POA: Diagnosis not present

## 2020-01-27 NOTE — Progress Notes (Signed)
TeleHealth Visit:  Due to the COVID-19 pandemic, this visit was completed with telemedicine (audio/video) technology to reduce patient and provider exposure as well as to preserve personal protective equipment.   Jennifer Dickson has verbally consented to this TeleHealth visit. The patient is located at home, the provider is located at the [facility] office. The participants in this visit include the listed provider and patient. The visit was conducted today via video.  Chief Complaint: OBESITY Jennifer Dickson is here to discuss her progress with her obesity treatment plan along with follow-up of her obesity related diagnoses. Jennifer Dickson is on the Category 3 Plan with breakfast options and states she is following her eating plan approximately 80% of the time. Jennifer Dickson states she is exercising 0 minutes 0 times per week.  Today's visit was #: 5 Starting weight: 359 lbs Starting date: 10/23/2019  Interim History: Jennifer Dickson finds it difficult to stay on the plan on weekends. She feels hungry from time to time, especially when eating off plan foods. She has no concerns other than cost of healthy foods vs unhealthy foods.   Plan: Discussed with patient [employer], [employer], etc specials and ways to eat on budget just as well. I.e. [employer] $1.99/lb frozen chicken breasts is cheaper than 80/20 hamberger.  Assessment/Plan:   1. Type 2 diabetes mellitus with other specified complication, without long-term current use of insulin ([name]) Jennifer Dickson's A1c was 8.0 on 01/07/2020 with PCP. Started Trulicity.   Plan: Jennifer Dickson is tolerating Trulicity well. She has not even given herself the 2nd injection yet. Reminded her that this will help with hunger and weight loss as well.  2. Vitamin D deficiency Jennifer Dickson's Vitamin D level was 28.3 on 10/23/2019. She is currently taking prescription vitamin D 50,000 IU each week and tolerating it well without side effects or concerns. She denies nausea, vomiting or muscle  weakness.  Ref. Range 10/23/2019 12:16  Vitamin D, 25-Hydroxy Latest Ref Range: 30.0 - 100.0 ng/mL 28.3 (L)   Plan: Jennifer Dickson denies a need for refill today. Low Vitamin D level contributes to fatigue and are associated with obesity, breast, and colon cancer. She agrees to continue to take prescription Vitamin D @50 ,000 IU every week and will follow-up for routine testing of Vitamin D, at least 2-3 times per year to avoid over-replacement.  3. Class 3 severe obesity with serious comorbidity and body mass index (BMI) of 50.0 to 59.9 in adult, unspecified obesity type ([name]) Jennifer Dickson is currently in the action stage of change. As such, her goal is to continue with weight loss efforts. She has agreed to the Category 3 Plan with breakfast options.   Exercise goals: All adults should avoid inactivity. Some physical activity is better than none, and adults who participate in any amount of physical activity gain some health benefits.  Behavioral modification strategies: meal planning and cooking strategies, keeping healthy foods in the home, avoiding temptations and planning for success.  Jennifer Dickson has agreed to follow-up with our clinic in 2 weeks with APP on 02/12/2020 at 1230. She was informed of the importance of frequent follow-up visits to maximize her success with intensive lifestyle modifications for her multiple health conditions.  Objective:   VITALS: Per patient if applicable, see vitals. GENERAL: Alert and in no acute distress. CARDIOPULMONARY: No increased WOB. Speaking in clear sentences.  PSYCH: Pleasant and cooperative. Speech normal rate and rhythm. Affect is appropriate. Insight and judgement are appropriate. Attention is focused, linear, and appropriate.  NEURO: Oriented as arrived to appointment  on time with no prompting.   Lab Results  Component Value Date   CREATININE 1.00 05/06/2019   BUN 16 05/06/2019   NA 137 05/06/2019   K 3.6 05/06/2019   CL 96 (L) 05/06/2019   CO2 27  05/06/2019   Lab Results  Component Value Date   ALT 49 (H) 05/06/2019   AST 38 05/06/2019   ALKPHOS 112 05/06/2019   BILITOT 0.7 05/06/2019   No results found for: HGBA1C Lab Results  Component Value Date   INSULIN 45.1 (H) 10/23/2019   Lab Results  Component Value Date   TSH 1.710 10/23/2019   No results found for: CHOL, HDL, LDLCALC, LDLDIRECT, TRIG, CHOLHDL Lab Results  Component Value Date   WBC 18.7 (H) 05/06/2019   HGB 13.0 05/06/2019   HCT 43.6 05/06/2019   MCV 83.0 05/06/2019   PLT 555 (H) 05/06/2019   No results found for: IRON, TIBC, FERRITIN  Attestation Statements:   Reviewed by clinician on day of visit: allergies, medications, problem list, medical history, surgical history, family history, social history, and previous encounter notes.  Time spent on visit including pre-visit chart review and post-visit charting and care was 30 minutes.   Coral Ceo, am acting as Location manager for [employer], DO.  I have reviewed the above documentation for accuracy and completeness, and I agree with the above. Marjory Sneddon, D.O.  The [place] was signed into law in 2016 which includes the topic of electronic health records.  This provides immediate access to information in MyChart.  This includes consultation notes, operative notes, office notes, lab results and pathology reports.  If you have any questions about what you read please let us know at your next visit so we can discuss your concerns and take corrective action if need be.  We are right here with you.

## 2020-01-28 ENCOUNTER — Ambulatory Visit: Payer: Medicaid Other | Admitting: Physical Therapy

## 2020-02-05 ENCOUNTER — Other Ambulatory Visit (HOSPITAL_COMMUNITY): Payer: Self-pay | Admitting: Physician Assistant

## 2020-02-05 ENCOUNTER — Other Ambulatory Visit: Payer: Self-pay | Admitting: Physician Assistant

## 2020-02-11 ENCOUNTER — Other Ambulatory Visit: Payer: Self-pay

## 2020-02-11 ENCOUNTER — Ambulatory Visit: Payer: Medicaid Other | Attending: Physician Assistant

## 2020-02-11 DIAGNOSIS — M542 Cervicalgia: Secondary | ICD-10-CM | POA: Diagnosis present

## 2020-02-11 DIAGNOSIS — M6281 Muscle weakness (generalized): Secondary | ICD-10-CM | POA: Insufficient documentation

## 2020-02-11 DIAGNOSIS — M25531 Pain in right wrist: Secondary | ICD-10-CM | POA: Insufficient documentation

## 2020-02-11 DIAGNOSIS — M25512 Pain in left shoulder: Secondary | ICD-10-CM | POA: Insufficient documentation

## 2020-02-11 DIAGNOSIS — M25511 Pain in right shoulder: Secondary | ICD-10-CM | POA: Insufficient documentation

## 2020-02-11 DIAGNOSIS — G8929 Other chronic pain: Secondary | ICD-10-CM | POA: Diagnosis present

## 2020-02-11 DIAGNOSIS — R252 Cramp and spasm: Secondary | ICD-10-CM | POA: Diagnosis present

## 2020-02-11 DIAGNOSIS — S83005D Unspecified dislocation of left patella, subsequent encounter: Secondary | ICD-10-CM | POA: Insufficient documentation

## 2020-02-11 DIAGNOSIS — R293 Abnormal posture: Secondary | ICD-10-CM | POA: Diagnosis present

## 2020-02-11 DIAGNOSIS — M25611 Stiffness of right shoulder, not elsewhere classified: Secondary | ICD-10-CM | POA: Diagnosis present

## 2020-02-11 DIAGNOSIS — M25562 Pain in left knee: Secondary | ICD-10-CM | POA: Insufficient documentation

## 2020-02-11 DIAGNOSIS — R262 Difficulty in walking, not elsewhere classified: Secondary | ICD-10-CM | POA: Insufficient documentation

## 2020-02-11 DIAGNOSIS — M654 Radial styloid tenosynovitis [de Quervain]: Secondary | ICD-10-CM | POA: Insufficient documentation

## 2020-02-12 ENCOUNTER — Ambulatory Visit (INDEPENDENT_AMBULATORY_CARE_PROVIDER_SITE_OTHER): Payer: Self-pay | Admitting: Adult Health

## 2020-02-12 ENCOUNTER — Other Ambulatory Visit: Payer: Self-pay | Admitting: Neurosurgery

## 2020-02-12 DIAGNOSIS — M5481 Occipital neuralgia: Secondary | ICD-10-CM

## 2020-02-13 NOTE — Therapy (Signed)
[place], [place], [address] Phone: [phone]   Fax:  [phone]  Physical Therapy Treatment/Re-evaluation  Patient Details  Name: Jennifer Dickson MRN: [id number] Date of Birth: 03-21-1975 Referring Provider (PT): Earlie Server, MD   Encounter Date: 02/11/2020   PT End of Session - 02/12/20 2032    Visit Number 5    Number of Visits 16    Date for PT Re-Evaluation 04/10/20    Authorization Type Medicaid - submitted for re-auth 02/12/2020    PT Start Time 1215    PT Stop Time 1300    PT Time Calculation (min) 45 min    Activity Tolerance Patient limited by pain    Behavior During Therapy [facility] for tasks assessed/performed           Past Medical History:  Diagnosis Date  . Anemia   . Anxiety   . Back pain   . Carpal tunnel syndrome   . Cervical radiculopathy   . Cervical radiculopathy   . Chest pain   . Chronic fatigue syndrome   . Constipation   . COVID-19   . Depression   . Diabetes mellitus without complication ([place])   . Diverticulosis   . DJD (degenerative joint disease)   . DVT (deep vein thrombosis) in pregnancy   . DVT (deep venous thrombosis) ([place])   . Endometrioma   . Endometriosis   . Fatty liver   . Fibromyalgia   . Fibromyalgia   . Gallbladder problem   . Heartburn   . High blood pressure   . High cholesterol   . Hypertension   . IBS (irritable bowel syndrome)   . Interstitial cystitis   . Joint pain   . Knee dislocation   . Mini stroke ([place])   . Osteoarthritis   . Osteoarthritis   . Palpitations   . Pinched nerve   . Sleep apnea   . SOB (shortness of breath) on exertion   . Swallowing difficulty   . Swelling of both lower extremities   . Tendonitis   . Varicose veins of both legs with edema   . Vitamin D deficiency     Past Surgical History:  Procedure Laterality Date  . CESAREAN SECTION    . CHOLECYSTECTOMY  1996    There were no vitals filed for  this visit.  Subjective: "Both my knee and shoulder stay in pain constantly. I am going to do an xray today because it's harder on the right side than my left right here by my collar bone (pointing near sternoclavicular joint). I had an appointment yesterday and also saw [facility] doctor for my neck in January sometime."  Pain: 10/10 L knee, R shoulder, and neck pain without significant distress noted    [facility] PT Assessment - 02/12/20 0001      Assessment   Medical Diagnosis LT patella dislocation    Referring Provider (PT) Earlie Server, MD      AROM   Right Shoulder Flexion 98 Degrees    Right Shoulder ABduction 88 Degrees    Right Shoulder Internal Rotation --   Can reach to R side along lower ribs   Right Shoulder External Rotation --   Can reach back of head at level of occiput   Right Knee Extension 5    Right Knee Flexion 92    Left Knee Extension 4    Left Knee Flexion 62      Strength   Overall  Strength Comments Shoulder MMT not assessed this session due to pain with AROM.    Right Knee Flexion 4+/5    Right Knee Extension 4/5    Left Knee Flexion 3+/5    Left Knee Extension 3+/5                         [facility] Adult PT Treatment/Exercise - 02/12/20 0001      Knee/Hip Exercises: Seated   Long Arc Quad Strengthening;Left;15 reps    Other Seated Knee/Hip Exercises HS digs 15x into BOSU ball with slight L knee FL. L ankle PF/DF x 15    Abd/Adduction Limitations Isometric B ADD (ball squeeze) x 15. Attempted ball squeeze with L knee EXT      Shoulder Exercises: Seated   Row Strengthening;Both;20 reps;Theraband    Theraband Level (Shoulder Row) Level 2 (Red)    Protraction Limitations Serratus punch while reclined (table at 145 degrees) with dowel x 15    Flexion Limitations low mat reclined to 145 degrees while pt performed AAROM FL with dowel x 15    Other Seated Exercises scapular retraction x 10. R pec stretch x 30 sec within tolerable range at  doorway      Manual Therapy   McConnell lateral to medial put on in sitting with knee in slight flexion                  PT Education - 02/12/20 2350    Education Details HEP, McConnell tape and to remove if any adverse effects occur, increasing daily activity/mobility, POC.    Person(s) Educated Patient    Methods Explanation;Demonstration;Tactile cues;Verbal cues    Comprehension Verbalized understanding;Returned demonstration;Verbal cues required;Tactile cues required;Need further instruction            PT Short Term Goals - 02/12/20 2359      PT SHORT TERM GOAL #1   Title Pt will be independent and compliant with initial HEP.    Status On-going      PT SHORT TERM GOAL #2   Title Pt will decrease pain by 25%  LT knee with walking    Status On-going      PT SHORT TERM GOAL #3   Title She will decr RT shouldr pain to 8/10 generally    Status On-going             PT Long Term Goals - 02/12/20 2043      PT LONG TERM GOAL #1   Title She will be indpendent with all hEP issued    Baseline independent with initial hEP    Time 8    Period Weeks    Status On-going      PT LONG TERM GOAL #2   Title She will report pain decr 50% in Lt knee and able to walk 300 feet    Baseline 100 feet and pain 7/10 or higher    Time 8    Period Weeks    Status On-going      PT LONG TERM GOAL #3   Title she wil report 50% decreased RT shoulder pain with reaching and self care    Baseline 10/10 pain at eval and not able to self care  without modifications.    Time 8    Period Weeks    Status On-going      PT LONG TERM GOAL #4   Title she will improve shoulder flexion to 120 degrees to ease  pain with self care    Baseline 105 deegrees at eval    Time 8    Period Weeks    Status On-going                 Plan - 02/12/20 2352    Clinical Impression Statement Patient presents to OPPT for re-evaluation for the first time since 01/17/2020. She has been limited with  household activities and continues to primarily rely on wheelchair for mobility when traveling longer distances. She is able to use [facility] for short distances but presents with antalgic gait. Pt is awaiting information regarding potential home health PT, so she can improve level of function and safety within her home in addition to addressing her L knee, R shoulder, and neck pain. She continues to demonstrate limited mobility and strength in all areas mentioned with pain during AROM and resistance. She was able to complete interventions at slow pace secondary to pain and fatigue. If unable to receive HHPT and if pt is able to attend OPPT more consistently, she may benefit from skilled PT intervention to address deficits. Pt's progress has currently been limited due to her inability to regularly attend sessions and limited tolerance secondary to pain.    Personal Factors and Comorbidities Fitness;Comorbidity 3+;Past/Current Experience;Time since onset of injury/illness/exacerbation;Transportation    Comorbidities DM, HTN, arthritis    Examination-Activity Limitations Bathing;Locomotion Level;Reach Overhead;Carry;Sleep;Dressing;Hygiene/Grooming    Examination-Participation Restrictions Meal Prep;Cleaning;Community Activity;Driving;Shop;Laundry    PT Frequency 1x / week    PT Duration 8 weeks    PT Treatment/Interventions ADLs/Self Care Home Management;Cryotherapy;Moist Heat;Therapeutic activities;Neuromuscular re-education;Therapeutic exercise;Patient/family education;Manual techniques;Passive range of motion;Dry needling;Splinting;Taping;Joint Manipulations    PT Next Visit Plan modalities PRN for pain, manual as indicated to address joint restriction/ROM limitation, McConnell tape, quad/LE strengthening    PT Home Exercise Plan LAQ, QS, heel slides, hip adduction, scap retraction and depression    Consulted and Agree with Plan of Care Patient           Patient will benefit from skilled therapeutic  intervention in order to improve the following deficits and impairments:  Pain,Postural dysfunction,Decreased activity tolerance,Decreased strength,Impaired UE functional use,Increased muscle spasms,Decreased range of motion,Difficulty walking  Visit Diagnosis: Muscle weakness (generalized)  Stiffness of right shoulder, not elsewhere classified  Chronic right shoulder pain  Difficulty in walking, not elsewhere classified  Cramp and spasm  Closed dislocation of left patella, subsequent encounter  Acute pain of left knee  Cervicalgia  Abnormal posture     Problem List Patient Active Problem List   Diagnosis Date Noted  . Hypertension associated with type 2 diabetes mellitus ([place]) 11/10/2019  . Hyperlipidemia associated with type 2 diabetes mellitus ([place]) 11/10/2019  . Diabetes mellitus ([place]) 10/23/2019  . Hypertension associated with diabetes ([place]) 10/23/2019  . Mixed diabetic hyperlipidemia associated with type 2 diabetes mellitus ([place]) 10/23/2019  . Vitamin D deficiency 10/23/2019  . B12 deficiency 10/23/2019  . Absolute anemia 10/23/2019  . BMI 50.0-59.9, adult ([place]) 10/23/2019  . Obesity, morbid, BMI 50 or higher ([place]) 06/16/2015  . Abdominal wall mass of suprapubic region 06/16/2015        Haydee Monica, PT, DPT 02/13/20 12:00 AM  [place], [place], [address] Phone: [phone]   Fax:  [phone]  Name: Jennifer Dickson MRN: [id number] Date of Birth: July 18, 1975

## 2020-02-23 ENCOUNTER — Encounter (INDEPENDENT_AMBULATORY_CARE_PROVIDER_SITE_OTHER): Payer: Self-pay | Admitting: Adult Health

## 2020-02-23 ENCOUNTER — Other Ambulatory Visit: Payer: Self-pay

## 2020-02-23 ENCOUNTER — Ambulatory Visit (INDEPENDENT_AMBULATORY_CARE_PROVIDER_SITE_OTHER): Payer: Medicaid Other | Admitting: Adult Health

## 2020-02-23 ENCOUNTER — Other Ambulatory Visit (INDEPENDENT_AMBULATORY_CARE_PROVIDER_SITE_OTHER): Payer: Self-pay | Admitting: Adult Health

## 2020-02-23 VITALS — BP 136/85 | HR 97 | Temp 97.9°F | Ht 67.0 in | Wt 338.0 lb

## 2020-02-23 DIAGNOSIS — E559 Vitamin D deficiency, unspecified: Secondary | ICD-10-CM | POA: Diagnosis not present

## 2020-02-23 DIAGNOSIS — M797 Fibromyalgia: Secondary | ICD-10-CM | POA: Diagnosis not present

## 2020-02-23 DIAGNOSIS — E1169 Type 2 diabetes mellitus with other specified complication: Secondary | ICD-10-CM

## 2020-02-23 DIAGNOSIS — Z6841 Body Mass Index (BMI) 40.0 and over, adult: Secondary | ICD-10-CM

## 2020-02-23 MED ORDER — VITAMIN D (ERGOCALCIFEROL) 1.25 MG (50000 UNIT) PO CAPS: 50000.0000 [IU] | ORAL_CAPSULE | ORAL | 0 refills | Status: DC

## 2020-02-24 DIAGNOSIS — M797 Fibromyalgia: Secondary | ICD-10-CM | POA: Insufficient documentation

## 2020-02-24 NOTE — Progress Notes (Signed)
Chief Complaint:   OBESITY Jennifer Dickson is here to discuss her progress with her obesity treatment plan along with follow-up of her obesity related diagnoses. Jennifer Dickson is on the Category 3 Plan and states she is following her eating plan approximately 70-75% of the time. Jennifer Dickson states she is doing 0 minutes 0 times per week.  Today's visit was #: 6 Starting weight: 359 lbs Starting date: 10/23/2019 Today's weight: 338 lbs Today's date: 02/23/2020 Total lbs lost to date: 21 lbs Total lbs lost since last in-office visit: 8 lbs  Interim History: Jennifer Dickson is craving sugar since starting Category 3 meal plan. She is on a budget and unable to purchase all the foods on the plan. She will adapt meals with what foods she has in her home. She is on Trulicity 7.34 mg once a week- T2D. She is experiencing mild nausea without vomiting.  Subjective:   1. Vitamin D deficiency Jennifer Dickson's Vitamin D level was 28.3 on 10/23/2019. She is currently taking prescription vitamin D 50,000 IU each week. She denies nausea, vomiting or muscle weakness. Level is below goal of 50.   Ref. Range 10/23/2019 12:16  Vitamin D, 25-Hydroxy Latest Ref Range: 30.0 - 100.0 ng/mL 28.3 (L)   2. Type 2 diabetes mellitus with other specified complication, without long-term current use of insulin ([name]) Pt is on Jardiance 10 mg daily. She recently started Trulicity 1.93 mg once a week and is experiencing mild nausea without vomiting. Her PCP manages her T2D.  3. Fibromyalgia PDMD reviewed- Oxycodone 5 mg was last filled 11/04/2018; hydrocodone 5-325 mg was last filled 06/20/2018. She reports very infrequent use of oral narcotics. She has been using Buprenorphine 5 mcg/hr patch- change once week.  Assessment/Plan:   1. Vitamin D deficiency Low Vitamin D level contributes to fatigue and are associated with obesity, breast, and colon cancer. She agrees to continue to take prescription Vitamin D @50 ,000 IU every week and will  follow-up for routine testing of Vitamin D, at least 2-3 times per year to avoid over-replacement. - Vitamin D, Ergocalciferol, (DRISDOL) 1.25 MG (50000 UNIT) CAPS capsule; Take 1 capsule (50,000 Units total) by mouth every 7 (seven) days.  Dispense: 4 capsule; Refill: 0  2. Type 2 diabetes mellitus with other specified complication, without long-term current use of insulin ([name]) Good blood sugar control is important to decrease the likelihood of diabetic complications such as nephropathy, neuropathy, limb loss, blindness, coronary artery disease, and death. Intensive lifestyle modification including diet, exercise and weight loss are the first line of treatment for diabetes. Continue Jardiance and Trulicity as directed by PCP.  3. Fibromyalgia Continue with Pain Clinic as directed.  4. Class 3 severe obesity with serious comorbidity and body mass index (BMI) of 50.0 to 59.9 in adult, unspecified obesity type ([name]) Jennifer Dickson is currently in the action stage of change. As such, her goal is to continue with weight loss efforts. She has agreed to the Category 3 Plan.   Interval goal: Follow Category 3 meal plan Monday-Friday. [place] on weekends.  Exercise goals: No exercise has been prescribed at this time.  Behavioral modification strategies: increasing lean protein intake, meal planning and cooking strategies and planning for success.  Jennifer Dickson has agreed to follow-up with our clinic in 2 weeks. She was informed of the importance of frequent follow-up visits to maximize her success with intensive lifestyle modifications for her multiple health conditions.   Objective:   Blood pressure 136/85, pulse 97, temperature 97.9 F (36.6 C), height  5\' 7"  (1.702 m), weight (!) 338 lb (153.3 kg), SpO2 97 %. Body mass index is 52.94 kg/m.  General: Cooperative, alert, well developed, in no acute distress. HEENT: Conjunctivae and lids unremarkable. Cardiovascular: Regular rhythm.  Lungs: Normal work of  breathing. Neurologic: No focal deficits.   Lab Results  Component Value Date   CREATININE 1.00 05/06/2019   BUN 16 05/06/2019   NA 137 05/06/2019   K 3.6 05/06/2019   CL 96 (L) 05/06/2019   CO2 27 05/06/2019   Lab Results  Component Value Date   ALT 49 (H) 05/06/2019   AST 38 05/06/2019   ALKPHOS 112 05/06/2019   BILITOT 0.7 05/06/2019   No results found for: HGBA1C Lab Results  Component Value Date   INSULIN 45.1 (H) 10/23/2019   Lab Results  Component Value Date   TSH 1.710 10/23/2019   No results found for: CHOL, HDL, LDLCALC, LDLDIRECT, TRIG, CHOLHDL Lab Results  Component Value Date   WBC 18.7 (H) 05/06/2019   HGB 13.0 05/06/2019   HCT 43.6 05/06/2019   MCV 83.0 05/06/2019   PLT 555 (H) 05/06/2019    Attestation Statements:   Reviewed by clinician on day of visit: allergies, medications, problem list, medical history, surgical history, family history, social history, and previous encounter notes.  Time spent on visit including pre-visit chart review and post-visit care and charting was 32 minutes.   Coral Ceo, am acting as Location manager for Mina Marble, NP.  I have reviewed the above documentation for accuracy and completeness, and I agree with the above. -  Khadeejah Castner d. Paislee Szatkowski, NP-C

## 2020-02-25 ENCOUNTER — Encounter: Payer: Self-pay | Admitting: Rehabilitative and Restorative Service Providers"

## 2020-02-25 ENCOUNTER — Ambulatory Visit: Payer: Medicaid Other | Admitting: Rehabilitative and Restorative Service Providers"

## 2020-02-25 ENCOUNTER — Other Ambulatory Visit: Payer: Self-pay

## 2020-02-25 DIAGNOSIS — M6281 Muscle weakness (generalized): Secondary | ICD-10-CM | POA: Diagnosis not present

## 2020-02-25 DIAGNOSIS — G8929 Other chronic pain: Secondary | ICD-10-CM

## 2020-02-25 DIAGNOSIS — S83005D Unspecified dislocation of left patella, subsequent encounter: Secondary | ICD-10-CM

## 2020-02-25 DIAGNOSIS — R293 Abnormal posture: Secondary | ICD-10-CM

## 2020-02-25 DIAGNOSIS — M25562 Pain in left knee: Secondary | ICD-10-CM

## 2020-02-25 DIAGNOSIS — R262 Difficulty in walking, not elsewhere classified: Secondary | ICD-10-CM

## 2020-02-25 DIAGNOSIS — M542 Cervicalgia: Secondary | ICD-10-CM

## 2020-02-25 DIAGNOSIS — M25531 Pain in right wrist: Secondary | ICD-10-CM

## 2020-02-25 DIAGNOSIS — R252 Cramp and spasm: Secondary | ICD-10-CM

## 2020-02-25 DIAGNOSIS — M25611 Stiffness of right shoulder, not elsewhere classified: Secondary | ICD-10-CM

## 2020-02-25 DIAGNOSIS — M654 Radial styloid tenosynovitis [de Quervain]: Secondary | ICD-10-CM

## 2020-02-25 NOTE — Patient Instructions (Signed)
Instructed pt to go home and use ice or heat, whichever makes her feel best, x 10-15 min after tx. She declined taping; only helps for a day and then becomes irritated

## 2020-02-25 NOTE — Therapy (Signed)
[place], [place], [address] Phone: [phone]   Fax:  [phone]  Physical Therapy Treatment  Patient Details  Name: Jennifer Dickson MRN: [id number] Date of Birth: August 10, 1975 Referring Provider (PT): Earlie Server, MD   Encounter Date: 02/25/2020   PT End of Session - 02/25/20 1723    Visit Number 6    PT Start Time 0433    PT Stop Time 0519    PT Time Calculation (min) 46 min    Activity Tolerance Patient limited by pain;Patient tolerated treatment well    Behavior During Therapy [facility] for tasks assessed/performed           Past Medical History:  Diagnosis Date  . Anemia   . Anxiety   . Back pain   . Carpal tunnel syndrome   . Cervical radiculopathy   . Cervical radiculopathy   . Chest pain   . Chronic fatigue syndrome   . Constipation   . COVID-19   . Depression   . Diabetes mellitus without complication ([place])   . Diverticulosis   . DJD (degenerative joint disease)   . DVT (deep vein thrombosis) in pregnancy   . DVT (deep venous thrombosis) ([place])   . Endometrioma   . Endometriosis   . Fatty liver   . Fibromyalgia   . Fibromyalgia   . Gallbladder problem   . Heartburn   . High blood pressure   . High cholesterol   . Hypertension   . IBS (irritable bowel syndrome)   . Interstitial cystitis   . Joint pain   . Knee dislocation   . Mini stroke ([place])   . Osteoarthritis   . Osteoarthritis   . Palpitations   . Pinched nerve   . Sleep apnea   . SOB (shortness of breath) on exertion   . Swallowing difficulty   . Swelling of both lower extremities   . Tendonitis   . Varicose veins of both legs with edema   . Vitamin D deficiency     Past Surgical History:  Procedure Laterality Date  . CESAREAN SECTION    . CHOLECYSTECTOMY  1996    There were no vitals filed for this visit.   Subjective Assessment - 02/25/20 1635    Subjective My knee is acting up today so I don't  want to bother with it. It is throbbing. Drs said I would have to have a knee replacement or surgery for the shoulder.    Currently in Pain? Yes    Pain Score 9     Pain Location Knee    Pain Orientation Left    Pain Descriptors / Indicators Throbbing    Pain Type Chronic pain    Pain Frequency Constant    Multiple Pain Sites Yes    Pain Score 7    Pain Location Shoulder    Pain Orientation Right    Pain Descriptors / Indicators Burning    Pain Type Chronic pain                             [facility] Adult PT Treatment/Exercise - 02/25/20 0001      Shoulder Exercises: Seated   Other Seated Exercises pt unable to do shoulder rolls or scap retract without pain initially; IR AROM with arm resting on armrest of chair x 15 followed by ER x 15; resisted IR x 15 with 5 sec hold folowed by IR  x 15 same positioning; R shoulder depression AROM x 20; trialed palm down AROM chest press umbilicus level but not comfortable so performed palm up and better x 15; R shoulder roll posterior x 15; scap retract x 15; bil horiz PNF (across body); horiz abdct to retract/hi row x 10; yellow band bicep curl R x 20; iso shoulder adduction with forearm supination/pronation x 15; iso cane press into floor with Lat activation x 15 with 2-3 sec press; cane iso press into floor with shoulder press forward/backward x 15 R                    PT Short Term Goals - 02/25/20 1726      PT SHORT TERM GOAL #1   Title Pt will be independent and compliant with initial HEP.    Status On-going      PT SHORT TERM GOAL #2   Title Pt will decrease pain by 25%  LT knee with walking    Status On-going      PT SHORT TERM GOAL #3   Title She will decr RT shouldr pain to 8/10 generally    Status On-going             PT Long Term Goals - 02/12/20 2043      PT LONG TERM GOAL #1   Title She will be indpendent with all hEP issued    Baseline independent with initial hEP    Time 8    Period Weeks     Status On-going      PT LONG TERM GOAL #2   Title She will report pain decr 50% in Lt knee and able to walk 300 feet    Baseline 100 feet and pain 7/10 or higher    Time 8    Period Weeks    Status On-going      PT LONG TERM GOAL #3   Title she wil report 50% decreased RT shoulder pain with reaching and self care    Baseline 10/10 pain at eval and not able to self care  without modifications.    Time 8    Period Weeks    Status On-going      PT LONG TERM GOAL #4   Title she will improve shoulder flexion to 120 degrees to ease pain with self care    Baseline 105 deegrees at eval    Time 8    Period Weeks    Status On-going                 Plan - 02/25/20 1724    Clinical Impression Statement Pt continues to present to PT with R shoulder pain and L knee pain; pt declined treatment on L knee today due to L knee already being highly irritable today. All R shoulder exercises had to be modified due to pain; pt was able to tolerate exercises with modifications. Pt would continue to benefit from PT for R shoulder ROM/strengthening and L knee strengthening as tolerated. Pt limited by pain.    PT Treatment/Interventions ADLs/Self Care Home Management;Cryotherapy;Moist Heat;Therapeutic activities;Neuromuscular re-education;Therapeutic exercise;Patient/family education;Manual techniques;Passive range of motion;Dry needling;Splinting;Taping;Joint Manipulations    PT Next Visit Plan modalities PRN for pain, manual as indicated to address joint restriction/ROM limitation, McConnell tape if desired, L quad/LE and R shoulder ROM and strengthening    Consulted and Agree with Plan of Care Patient           Patient will benefit from skilled  therapeutic intervention in order to improve the following deficits and impairments:  Pain,Postural dysfunction,Decreased activity tolerance,Decreased strength,Impaired UE functional use,Increased muscle spasms,Decreased range of motion,Difficulty  walking  Visit Diagnosis: Stiffness of right shoulder, not elsewhere classified  Muscle weakness (generalized)  Chronic right shoulder pain  Difficulty in walking, not elsewhere classified  Cramp and spasm  Closed dislocation of left patella, subsequent encounter  Acute pain of left knee  Cervicalgia  Abnormal posture  Pain in right wrist  Radial styloid tenosynovitis (de quervain)  Chronic left shoulder pain     Problem List Patient Active Problem List   Diagnosis Date Noted  . Fibromyalgia 02/24/2020  . Hypertension associated with type 2 diabetes mellitus ([place]) 11/10/2019  . Hyperlipidemia associated with type 2 diabetes mellitus ([place]) 11/10/2019  . Diabetes mellitus ([place]) 10/23/2019  . Hypertension associated with diabetes ([place]) 10/23/2019  . Mixed diabetic hyperlipidemia associated with type 2 diabetes mellitus ([place]) 10/23/2019  . Vitamin D deficiency 10/23/2019  . B12 deficiency 10/23/2019  . Absolute anemia 10/23/2019  . BMI 50.0-59.9, adult ([employer]) 10/23/2019  . Class 3 severe obesity with serious comorbidity and body mass index (BMI) of 50.0 to 59.9 in adult ([place]) 06/16/2015  . Abdominal wall mass of suprapubic region 06/16/2015    Myra Rude, PT 02/25/2020, 5:28 PM  [facility] [address] [place], [place], [address] Phone: [phone]   Fax:  [phone]  Name: Jennifer Dickson MRN: [id number] Date of Birth: 23-Oct-1975

## 2020-02-27 ENCOUNTER — Telehealth: Payer: Self-pay

## 2020-02-27 IMAGING — CT CT ANGIO CHEST
1 of 2 series · 18 of 32 positions shown · IV contrast (APPLIED)
Comparison: Chest CT-09/05/2016

CLINICAL DATA: Chest pain and shortness of breath for the past 2-3
months. Evaluate for pulmonary embolism.

EXAM:
CT ANGIOGRAPHY CHEST WITH CONTRAST
TECHNIQUE: Multidetector CT imaging of the chest was performed using the
standard protocol during bolus administration of intravenous
contrast. Multiplanar CT image reconstructions and MIPs were
obtained to evaluate the vascular anatomy.
CONTRAST:  75mL EXEELE-OQ9 IOPAMIDOL (EXEELE-OQ9) INJECTION 76%

[Series 7: thins 1.0 b31s · axial · 0.76mm/px · z∈[-330,-28]mm · 18 of 336 slices shown]
[im 17/336  lung]
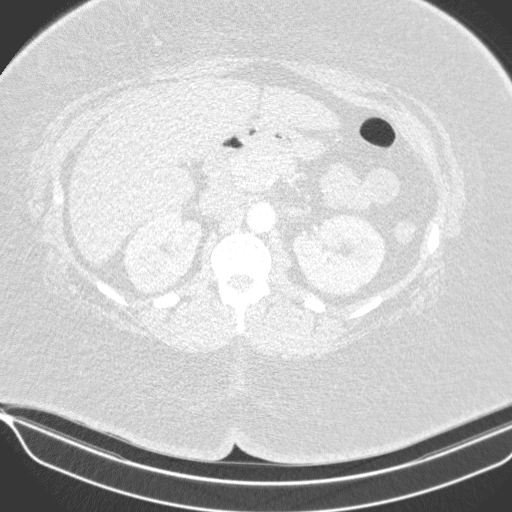
[im 34/336  mediastinal]
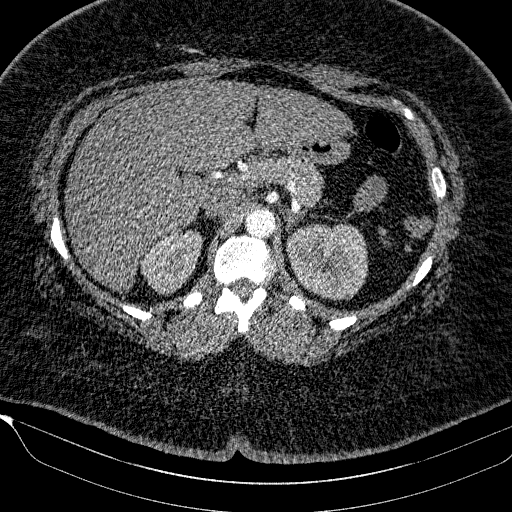
[im 68/336  lung]
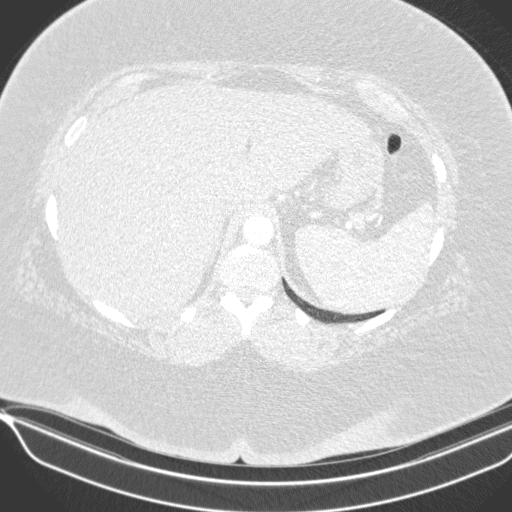
[im 84/336  mediastinal]
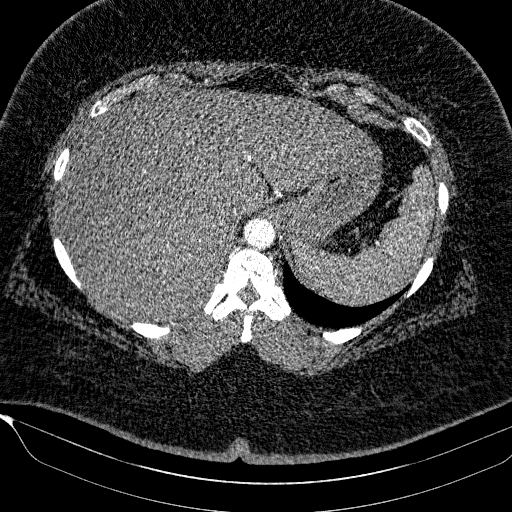
[im 101/336  lung]
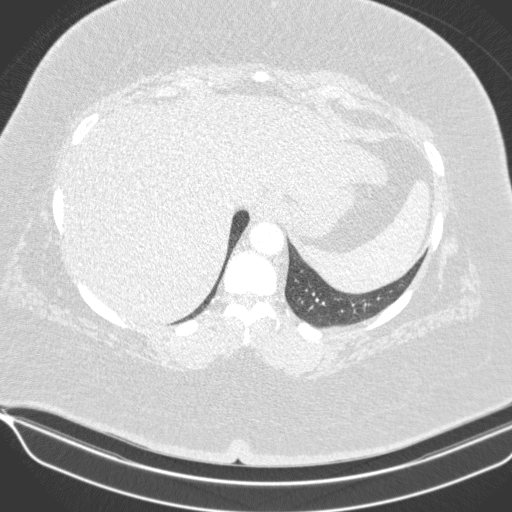
[im 112/336  mediastinal]
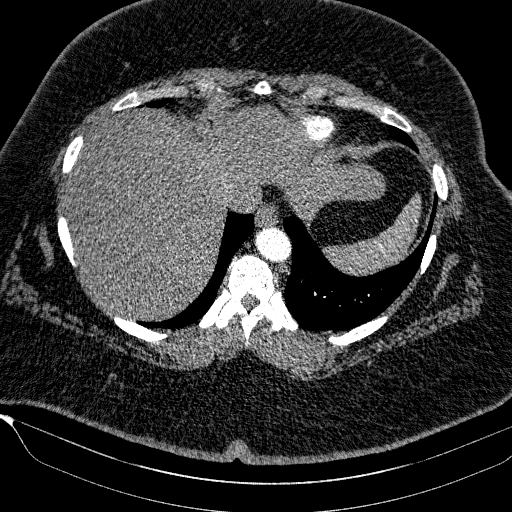
[im 118/336  lung]
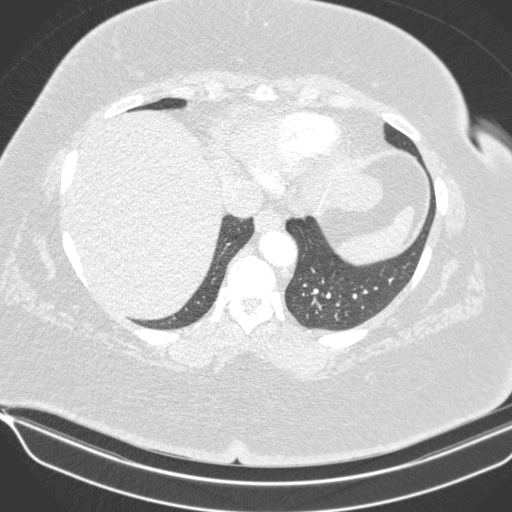
[im 151/336  mediastinal]
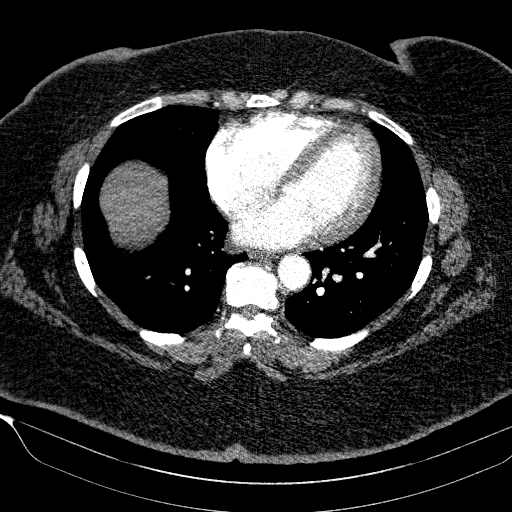
[im 159/336  lung]
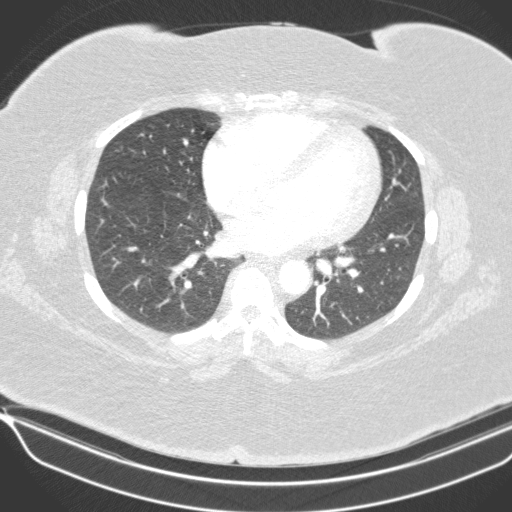
[im 168/336  mediastinal]
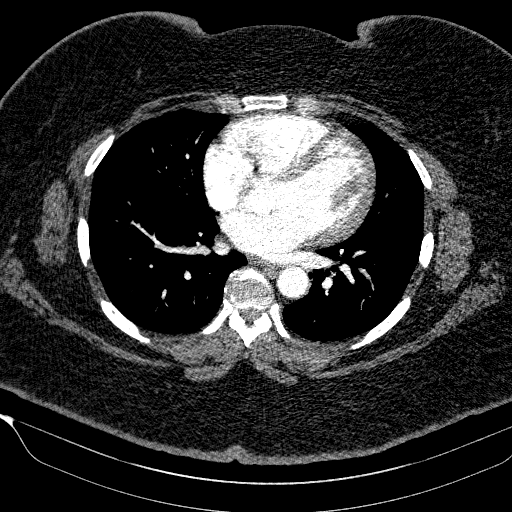
[im 185/336  lung]
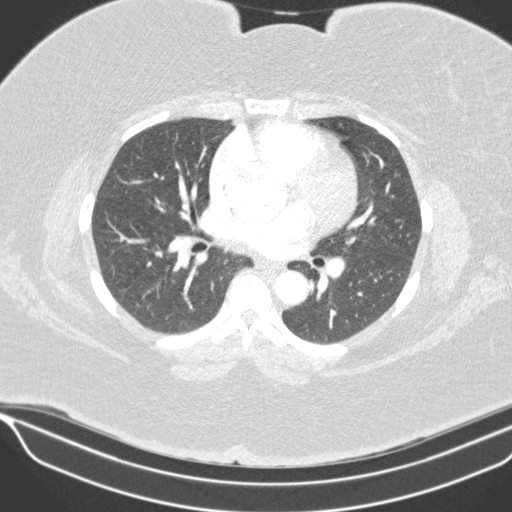
[im 218/336  mediastinal]
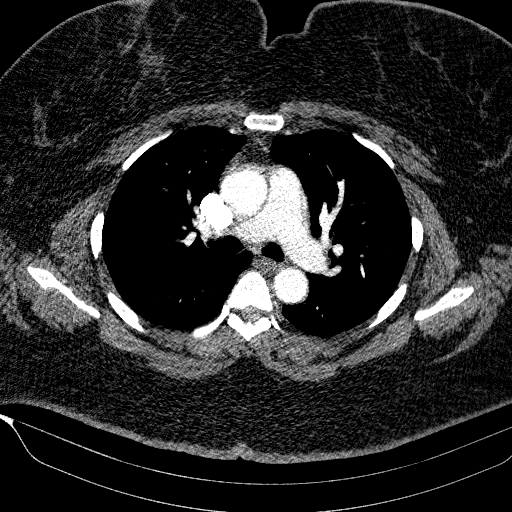
[im 224/336  lung]
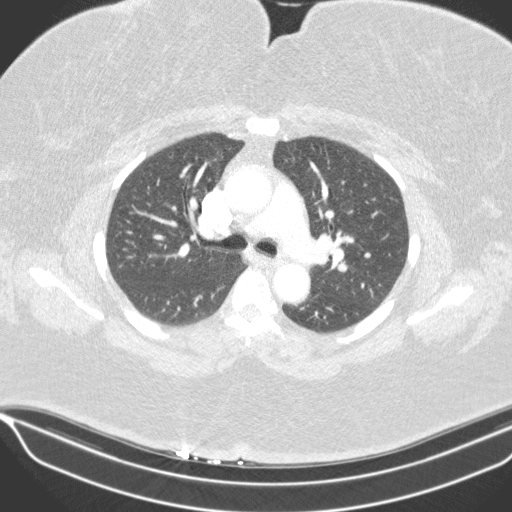
[im 235/336  mediastinal]
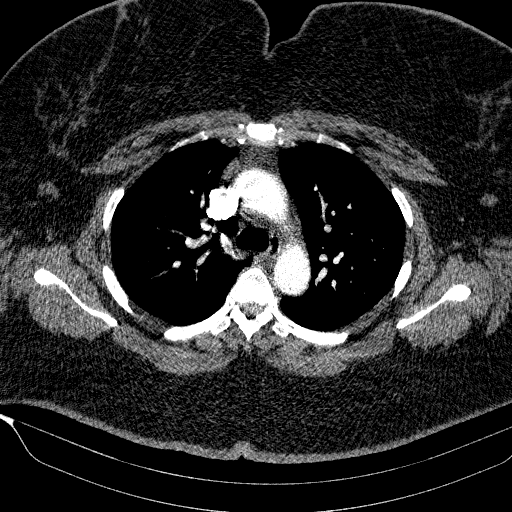
[im 252/336  lung]
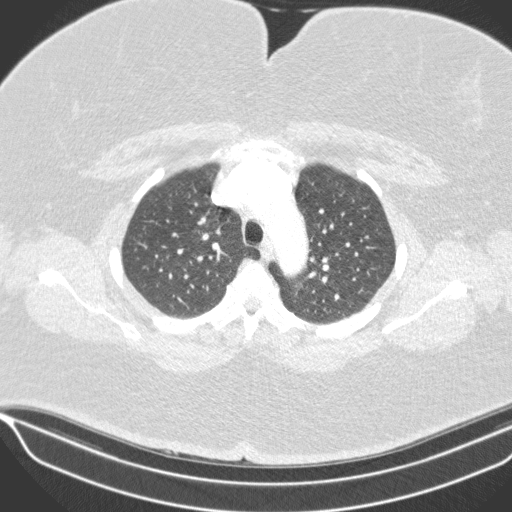
[im 269/336  mediastinal]
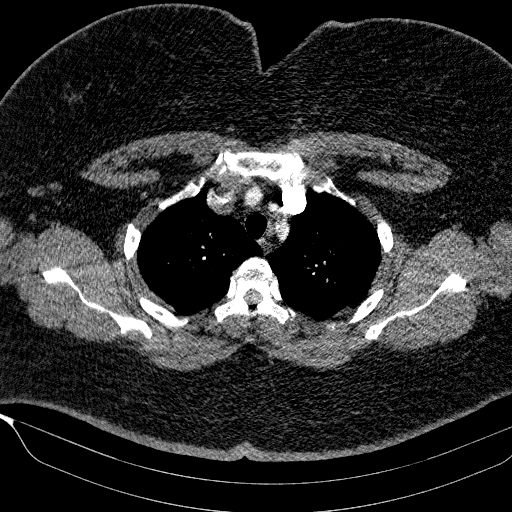
[im 302/336  lung]
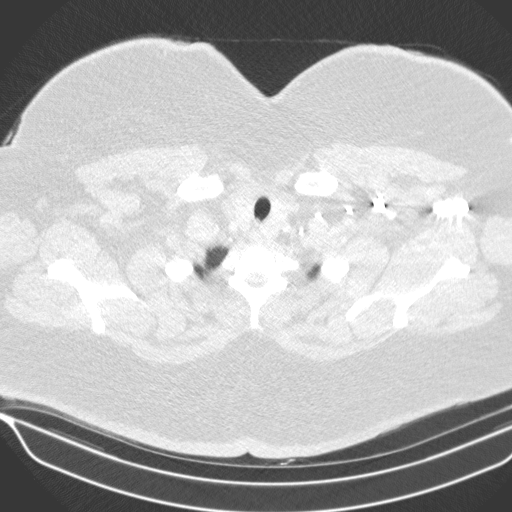
[im 319/336  mediastinal]
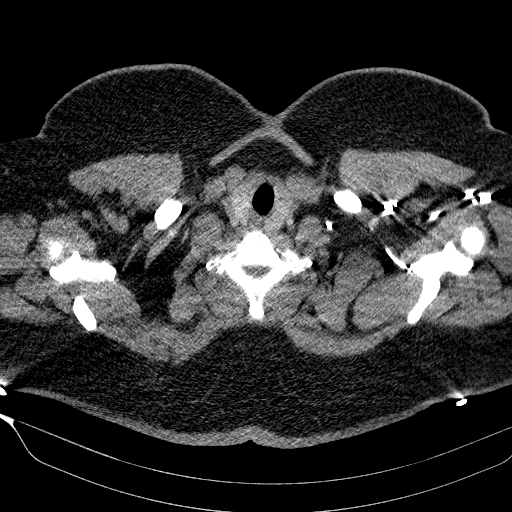

[18 of 32 positions shown; findings below may reference images not displayed]

FINDINGS: Vascular Findings:

There is adequate opacification of the pulmonary arterial system
with the main pulmonary artery measuring 242 Hounsfield units. There
are no discrete filling defects within the pulmonary arterial tree
to suggest pulmonary embolism. Normal caliber of the main pulmonary
artery.

Borderline cardiomegaly. No pericardial effusion. Normal caliber of
the thoracic aorta. No definite evidence of thoracic aortic
dissection or periaortic stranding this nongated examination.

Conventional configuration of the aortic arch. The branch vessels of
the aortic arch remain patent throughout their imaged courses.

Review of the MIP images confirms the above findings.

----------------------------------------------------------------------------------

Nonvascular Findings:

Mediastinum/Lymph Nodes: Bilateral axillary lymph nodes are
symmetrically prominent with index right axillary lymph node
measuring 1 cm greatest short axis diameter (image 33, series 6 and
index left axillary lymph node measuring 1.1 cm (image 36), likely
reactive due to patient body habitus. No bulky mediastinal, hilar or
axillary lymphadenopathy.

Lungs/Pleura: Evaluation of the pulmonary parenchyma is minimally
degraded secondary to patient respiratory artifact. No discrete
focal airspace opacities. No pleural effusion or pneumothorax. The
central pulmonary airways appear widely patent.

No discrete pulmonary nodules.

Upper abdomen: Limited early arterial phase evaluation of the upper
abdomen demonstrates diffuse decreased attenuation hepatic
parenchyma suggestive hepatic steatosis. This finding is associated
with mild nodularity hepatic contour as could be seen in the setting
of early cirrhotic change. Post cholecystectomy. Incidental note is
made of an accessory left hepatic artery arising from the left
gastric artery.

Musculoskeletal: No acute or aggressive osseous abnormalities. DDD
of the lower cervical spine. Regional soft tissues appear normal.
Normal appearance of the thyroid gland.
IMPRESSION: 1. No explanation for patient's persistent chest pain and shortness
of breath. Specifically, no evidence of pulmonary embolism.
2. Borderline cardiomegaly. Further evaluation cardiac echo could be
performed as indicated.
3. Suspected hepatic steatosis with mild nodularity hepatic contour
as could be seen in the setting of early cirrhotic change.
Correlation with LFTs is advised.

## 2020-02-27 NOTE — Telephone Encounter (Signed)
Received a PA request from [facility] for patient's Breo 100. Patient must try and fail 2 alternatives before [facility] will be covered. Covered alternatives: Advair HFA/Diskus, Dulera, Symbicort. Tried/Failed: Symbicort  Sarah please advise if you want to switch patient to Advair or Dulera.  Thanks!

## 2020-03-01 MED ORDER — FLUTICASONE-SALMETEROL 250-50 MCG/DOSE IN AEPB: 1.0000 | INHALATION_SPRAY | Freq: Two times a day (BID) | RESPIRATORY_TRACT | 11 refills | Status: AC

## 2020-03-01 NOTE — Telephone Encounter (Signed)
Please send in Advair either HFA or discus, patient preference. If they fail, we will appeal. Thanks

## 2020-03-01 NOTE — Telephone Encounter (Signed)
Rx for Advair 250 Diskus has been sent to preferred pharmacy per patient request. Patient is aware and voiced her understanding.  Nothing further needed.

## 2020-03-01 NOTE — Telephone Encounter (Signed)
Sarah, please advise on what dose of either Advair HFA or Advair Diskus you would like for [place] to send in.  Advair HFA doses: 45-60mcg, 115-64mcg, 230-55mcg Advair Diskus doses: 100-43mcg, 250-83mcg, 500-21mcg

## 2020-03-01 NOTE — Addendum Note (Signed)
Addended by: Claudette Head A on: 03/01/2020 10:31 AM   Modules accepted: Orders

## 2020-03-01 NOTE — Telephone Encounter (Signed)
Advair HFA doses: 115-48mcg,  Advair Diskus doses: , 250-4mcg,  Thanks so much

## 2020-03-02 ENCOUNTER — Telehealth: Payer: Self-pay

## 2020-03-02 ENCOUNTER — Ambulatory Visit: Payer: Medicaid Other

## 2020-03-02 NOTE — Telephone Encounter (Signed)
Patient states that she forgot about scheduled appointment. Attendance policy was reviewed with patient. She was informed that she can schedule one appointment at a time moving forward due to having two no-show appointments Patient confirmed next scheduled visit.

## 2020-03-03 ENCOUNTER — Ambulatory Visit: Payer: Medicaid Other | Admitting: Acute Care

## 2020-03-03 NOTE — Progress Notes (Deleted)
History of Present Illness  Jennifer Dickson is a 45 y.o. 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 She is followed by Dr. Vaughan Browner   03/03/2020  Pt. Presents for 2 month follow up. She was last seen 12/2019 for dyspnea. She had recently had an MRI which showed some atelectasis in her lung bases.She wanted to follow up with [place] about this finding. She endorsed more frequent use of her inhaler   Test Results:  CBC Latest Ref Rng & Units 05/06/2019 01/18/2019 10/05/2018  WBC 4.0 - 10.5 K/uL 18.7(H) 6.7 11.7(H)  Hemoglobin 12.0 - 15.0 g/dL 13.0 12.9 11.8(L)  Hematocrit 36.0 - 46.0 % 43.6 43.3 39.3  Platelets 150 - 400 K/uL 555(H) 388 563(H)    BMP Latest Ref Rng & Units 05/06/2019 01/18/2019 10/05/2018  Glucose 70 - 99 mg/dL 144(H) 126(H) 138(H)  BUN 6 - 20 mg/dL 16 <5(L) 7  Creatinine 0.44 - 1.00 mg/dL 1.00 0.68 0.83  BUN/Creat Ratio 9 - 23 - - -  Sodium 135 - 145 mmol/L 137 137 137  Potassium 3.5 - 5.1 mmol/L 3.6 3.6 3.5  Chloride 98 - 111 mmol/L 96(L) 103 100  CO2 22 - 32 mmol/L 27 28 26   Calcium 8.9 - 10.3 mg/dL 9.7 8.7(L) 9.2    BNP    Component Value Date/Time   BNP 25.3 04/26/2019 1822    ProBNP No results found for: PROBNP  PFT    Component Value Date/Time   FEV1PRE 2.23 09/08/2019 1049   FEV1POST 2.25 09/08/2019 1049   FVCPRE 2.76 09/08/2019 1049   FVCPOST 2.81 09/08/2019 1049   TLC 5.09 09/08/2019 1049   DLCOUNC 34.16 09/08/2019 1049   PREFEV1FVCRT 81 09/08/2019 1049   PSTFEV1FVCRT 80 09/08/2019 1049    No results found.   Past medical hx Past Medical History:  Diagnosis Date  . Anemia   . Anxiety   . Back pain   . Carpal tunnel syndrome   . Cervical radiculopathy   . Cervical radiculopathy   . Chest pain   . Chronic fatigue syndrome   . Constipation   . COVID-19   . Depression   . Diabetes mellitus without complication ([place])   .  Diverticulosis   . DJD (degenerative joint disease)   . DVT (deep vein thrombosis) in pregnancy   . DVT (deep venous thrombosis) ([place])   . Endometrioma   . Endometriosis   . Fatty liver   . Fibromyalgia   . Fibromyalgia   . Gallbladder problem   . Heartburn   . High blood pressure   . High cholesterol   . Hypertension   . IBS (irritable bowel syndrome)   . Interstitial cystitis   . Joint pain   . Knee dislocation   . Mini stroke ([place])   . Osteoarthritis   . Osteoarthritis   . Palpitations   . Pinched nerve   . Sleep apnea   . SOB (shortness of breath) on exertion   . Swallowing difficulty   . Swelling of both lower extremities   . Tendonitis   . Varicose veins of both legs with edema   . Vitamin D deficiency      Social History   Tobacco Use  . Smoking status: Former Smoker    Years: 5.00    Quit date: 2007    Years since quitting: 15.1  . Smokeless tobacco: Never  Used  Vaping Use  . Vaping Use: Never used  Substance Use Topics  . Alcohol use: No    Comment: None since 2013  . Drug use: No    [name] reports that she quit smoking about 15 years ago. She quit after 5.00 years of use. She has never used smokeless tobacco. She reports that she does not drink alcohol and does not use drugs.  Tobacco Cessation: Counseling given: Not Answered   Past surgical hx, Family hx, Social hx all reviewed.  Current Outpatient Medications on File Prior to Visit  Medication Sig  . albuterol (VENTOLIN HFA) 108 (90 Base) MCG/ACT inhaler Inhale 1 puff into the lungs every 4 (four) hours as needed.  Marland Kitchen amitriptyline (ELAVIL) 50 MG tablet Take by mouth.  Marland Kitchen amLODipine (NORVASC) 5 MG tablet Take 5 mg by mouth daily.  Marland Kitchen aspirin 81 MG EC tablet Take by mouth.  Marland Kitchen atorvastatin (LIPITOR) 20 MG tablet Take 1 tablet by mouth daily.  . baclofen (LIORESAL) 10 MG tablet Take 10 mg by mouth every 8 (eight) hours as needed for muscle spasms.  . carbamide peroxide (DEBROX) 6.5 % OTIC  solution Place 5 drops into both ears daily as needed (Ear wax removal).   . cetirizine (ZYRTEC) 10 MG tablet Take 10 mg by mouth daily.  . chlorthalidone (HYGROTON) 25 MG tablet Take 25 mg by mouth daily.  . Cholecalciferol (VITAMIN D3 PO) Take 1 tablet by mouth daily.  Marland Kitchen conjugated estrogens (PREMARIN) vaginal cream Place vaginally.  . Cyclobenzaprine HCl (FLEXERIL PO) Take by mouth.  . diclofenac Sodium (VOLTAREN) 1 % GEL Apply 2 g topically 2 (two) times daily as needed (Joint pain).  . Dulaglutide 0.75 MG/0.5ML SOPN Inject into the skin.  Marland Kitchen esomeprazole (NEXIUM) 40 MG capsule Take 40 mg by mouth daily.  . ferrous gluconate (FERGON) 324 MG tablet Take 324 mg by mouth every morning.  . fluconazole (DIFLUCAN) 100 MG tablet Take 100 mg by mouth daily.   . fluticasone (FLONASE) 50 MCG/ACT nasal spray Place 2 sprays into both nostrils daily as needed for allergies or rhinitis.   . Fluticasone-Salmeterol (ADVAIR DISKUS) 250-50 MCG/DOSE AEPB Inhale 1 puff into the lungs in the morning and at bedtime.  . hydrOXYzine (ATARAX/VISTARIL) 25 MG tablet Take 25 mg by mouth 3 (three) times daily as needed for anxiety or itching.   Marland Kitchen ibuprofen (ADVIL) 600 MG tablet Take 600 mg by mouth every 6 (six) hours as needed for fever, headache or moderate pain.  Marland Kitchen JARDIANCE 10 MG TABS tablet Take 10 mg by mouth daily.  Marland Kitchen MAGNESIUM PO Take by mouth.  . meclizine (ANTIVERT) 25 MG tablet Take 25 mg by mouth 3 (three) times daily as needed.   . meloxicam (MOBIC) 15 MG tablet Take 15 mg by mouth daily.  . montelukast (SINGULAIR) 10 MG tablet Take by mouth.  . Multiple Vitamin tablet Take 1 tablet by mouth daily.  Marland Kitchen nystatin (MYCOSTATIN/NYSTOP) powder Apply 1 g 4 (four) times daily as needed topically (Dry Skin).  . nystatin cream (MYCOSTATIN) Apply 1 application 2 (two) times daily as needed topically for dry skin.  Marland Kitchen oxyCODONE (OXY IR/ROXICODONE) 5 MG immediate release tablet Take 5 mg by mouth every 8 (eight) hours as  needed for severe pain.  . Probiotic Product (UP4 PROBIOTICS WOMENS PO) Take 1 capsule by mouth daily.   . promethazine (PHENERGAN) 25 MG tablet Take 1 tablet (25 mg total) every 6 (six) hours as needed by mouth for nausea  or vomiting.  . sodium chloride (OCEAN) 0.65 % nasal spray Place 1 spray into the nose daily as needed for congestion.   . SUMAtriptan (IMITREX) 100 MG tablet Take 1 tablet (100 mg total) by mouth once as needed for migraine. May repeat in 2 hours if headache persists or recurs.  . temazepam (RESTORIL) 15 MG capsule Take 15 mg by mouth at bedtime as needed.  Marland Kitchen tetrahydrozoline 0.05 % ophthalmic solution Place 2 drops into both eyes daily as needed (Dry eyes).  . TRINTELLIX 10 MG TABS tablet Take 10 mg by mouth daily.  . Vitamin D, Ergocalciferol, (DRISDOL) 1.25 MG (50000 UNIT) CAPS capsule Take 1 capsule (50,000 Units total) by mouth every 7 (seven) days.   No current facility-administered medications on file prior to visit.     Allergies  Allergen Reactions  . Cyclobenzaprine Rash and Other (See Comments)    Also made heart race Also made heart race Other Reaction: rapid heart rate  . Pregabalin Other (See Comments)    Heart Racing, coma like feeling and delusional  . Shellfish Allergy Anaphylaxis  . Amoxicillin-Pot Clavulanate Rash  . Topiramate Other (See Comments)    Made headaches worse  . Sumatriptan Palpitations    Review Of Systems:  Constitutional:   No  weight loss, night sweats,  Fevers, chills, fatigue, or  lassitude.  HEENT:   No headaches,  Difficulty swallowing,  Tooth/dental problems, or  Sore throat,                No sneezing, itching, ear ache, nasal congestion, post nasal drip,   CV:  No chest pain,  Orthopnea, PND, swelling in lower extremities, anasarca, dizziness, palpitations, syncope.   GI  No heartburn, indigestion, abdominal pain, nausea, vomiting, diarrhea, change in bowel habits, loss of appetite, bloody stools.   Resp: No  shortness of breath with exertion or at rest.  No excess mucus, no productive cough,  No non-productive cough,  No coughing up of blood.  No change in color of mucus.  No wheezing.  No chest wall deformity  Skin: no rash or lesions.  GU: no dysuria, change in color of urine, no urgency or frequency.  No flank pain, no hematuria   MS:  No joint pain or swelling.  No decreased range of motion.  No back pain.  Psych:  No change in mood or affect. No depression or anxiety.  No memory loss.   Vital Signs There were no vitals taken for this visit.   Physical Exam:  General- No distress,  A&Ox3 ENT: No sinus tenderness, TM clear, pale nasal mucosa, no oral exudate,no post nasal drip, no LAN Cardiac: S1, S2, regular rate and rhythm, no murmur Chest: No wheeze/ rales/ dullness; no accessory muscle use, no nasal flaring, no sternal retractions Abd.: Soft Non-tender Ext: No clubbing cyanosis, edema Neuro:  normal strength Skin: No rashes, warm and dry Psych: normal mood and behavior   Assessment/Plan  No problem-specific Assessment & Plan notes found for this encounter.    Magdalen Spatz, NP 03/03/2020  11:40 AM

## 2020-03-10 ENCOUNTER — Ambulatory Visit (INDEPENDENT_AMBULATORY_CARE_PROVIDER_SITE_OTHER): Payer: Medicaid Other | Admitting: Adult Health

## 2020-03-11 ENCOUNTER — Ambulatory Visit: Payer: Medicaid Other | Attending: Physician Assistant

## 2020-03-11 ENCOUNTER — Other Ambulatory Visit: Payer: Self-pay

## 2020-03-11 DIAGNOSIS — M6281 Muscle weakness (generalized): Secondary | ICD-10-CM

## 2020-03-11 DIAGNOSIS — R252 Cramp and spasm: Secondary | ICD-10-CM | POA: Diagnosis present

## 2020-03-11 DIAGNOSIS — R262 Difficulty in walking, not elsewhere classified: Secondary | ICD-10-CM | POA: Diagnosis present

## 2020-03-11 DIAGNOSIS — G8929 Other chronic pain: Secondary | ICD-10-CM | POA: Diagnosis present

## 2020-03-11 DIAGNOSIS — M25511 Pain in right shoulder: Secondary | ICD-10-CM | POA: Diagnosis present

## 2020-03-11 DIAGNOSIS — M25611 Stiffness of right shoulder, not elsewhere classified: Secondary | ICD-10-CM

## 2020-03-11 NOTE — Therapy (Addendum)
[place], [place], [address] Phone: [phone]   Fax:  [phone]  Physical Therapy Treatment/Discharge  Patient Details  Name: Jennifer Dickson MRN: [id number] Date of Birth: [birth year] Referring Provider (PT): Earlie Server, MD   Encounter Date: 03/11/2020   PT End of Session - 03/11/20 1402    Visit Number 7    Number of Visits 16    Date for PT Re-Evaluation 04/10/20    Authorization Type Medicaid    Authorization Time Period 02/25/2020 - 04/20/2020    Authorization - Visit Number 2    Authorization - Number of Visits 8    PT Start Time 6606   pt arrived late   PT Stop Time 1446    PT Time Calculation (min) 43 min    Activity Tolerance Patient limited by pain;Patient tolerated treatment well    Behavior During Therapy [facility] for tasks assessed/performed           Past Medical History:  Diagnosis Date  . Anemia   . Anxiety   . Back pain   . Carpal tunnel syndrome   . Cervical radiculopathy   . Cervical radiculopathy   . Chest pain   . Chronic fatigue syndrome   . Constipation   . COVID-19   . Depression   . Diabetes mellitus without complication ([place])   . Diverticulosis   . DJD (degenerative joint disease)   . DVT (deep vein thrombosis) in pregnancy   . DVT (deep venous thrombosis) ([place])   . Endometrioma   . Endometriosis   . Fatty liver   . Fibromyalgia   . Fibromyalgia   . Gallbladder problem   . Heartburn   . High blood pressure   . High cholesterol   . Hypertension   . IBS (irritable bowel syndrome)   . Interstitial cystitis   . Joint pain   . Knee dislocation   . Mini stroke ([place])   . Osteoarthritis   . Osteoarthritis   . Palpitations   . Pinched nerve   . Sleep apnea   . SOB (shortness of breath) on exertion   . Swallowing difficulty   . Swelling of both lower extremities   . Tendonitis   . Varicose veins of both legs with edema   . Vitamin D deficiency      Past Surgical History:  Procedure Laterality Date  . CESAREAN SECTION    . CHOLECYSTECTOMY  1996    There were no vitals filed for this visit.   Subjective Assessment - 03/11/20 1402    Subjective "I have a pain patch on the R shoulder, so it's not as bad as it was. It was about a 7/10 and now a 6/10 like a mild toothache. The L knee is about the same. The shoulder is a little more intense than the knee."    Limitations House hold activities;Lifting;Writing    How long can you walk comfortably? 100-150 feet      Before she was able to walk 1-2 miles. (Last done a year ago. )    Diagnostic tests MRI :  still slight dislocation  9 done [facility])    Patient Stated Goals Get rid of pain    Currently in Pain? Yes    Pain Score 6     Pain Location Knee    Pain Orientation Left    Pain Descriptors / Indicators Throbbing    Pain Type Chronic pain    Pain  Onset More than a month ago    Pain Score 6    Pain Location Shoulder    Pain Orientation Right    Pain Descriptors / Indicators Aching;Burning    Pain Type Chronic pain    Pain Onset More than a month ago              [facility] PT Assessment - 03/11/20 0001      Assessment   Medical Diagnosis LT patella dislocation    Referring Provider (PT) Earlie Server, MD                         [facility] Adult PT Treatment/Exercise - 03/11/20 0001      Knee/Hip Exercises: Seated   Long Arc Quad Strengthening;Left;1 set;15 reps    Long Arc Quad Limitations limited to 1 set due to increased anterior L knee pain    Other Seated Knee/Hip Exercises seated clamshell with yellow band 2 x 15    Other Seated Knee/Hip Exercises HS digs 2 x 15 into BOSU ball with slight L knee FL. L ankle PF/DF x 15    Hamstring Curl Strengthening;Left;2 sets;15 reps    Hamstring Limitations yellow band    Abd/Adduction Limitations ball squeeze 2 x 15    Sit to Sand Other (comment);with UE support   3 reps with encouragement for equal WB  through each LE. Heavy reliance on BUE and RLE with tendency to extend L knee and avoid any weightbearing during transfer     Shoulder Exercises: Seated   Other Seated Exercises R shoulder depression AROM 2 x 15; scap retract 2 x 15; bil horiz PNF (across body); horizontal ABD 2 x 10 wit hyellow band; yellow band bicep curl 2 x 15 with yellow band; iso cane press into floor with Lat activation x 15 with 2-3 sec press                  PT Education - 03/11/20 1553    Education Details HEP handout provided and education regarding MedBridge[employer] app to access videos of exercises. Advised patient to choose exercises based on tolerance and not to try completing entire HEP at once but to at least complete a few for L knee and R shoulder each day as tolerated. Patient unsure about PT referral for cervicalgia - advised to clarify with her doctor as PT was mentioned in most recent office visit note but no specific referral scanned yet.    Person(s) Educated Patient    Methods Explanation;Demonstration;Tactile cues;Verbal cues    Comprehension Verbalized understanding;Returned demonstration;Verbal cues required;Tactile cues required;Need further instruction            PT Short Term Goals - 03/11/20 1603      PT SHORT TERM GOAL #1   Title Pt will be independent and compliant with initial HEP.    Status On-going      PT SHORT TERM GOAL #2   Title Pt will decrease pain by 25%  LT knee with walking    Status On-going      PT SHORT TERM GOAL #3   Title She will decr RT shouldr pain to 8/10 generally    Status On-going             PT Long Term Goals - 03/11/20 1603      PT LONG TERM GOAL #1   Title She will be indpendent with all hEP issued    Baseline independent with initial  hEP    Time 8    Period Weeks    Status On-going      PT LONG TERM GOAL #2   Title She will report pain decr 50% in Lt knee and able to walk 300 feet    Baseline 100 feet and pain 7/10 or higher    Time 8     Period Weeks    Status On-going      PT LONG TERM GOAL #3   Title she wil report 50% decreased RT shoulder pain with reaching and self care    Baseline 10/10 pain at eval and not able to self care  without modifications.    Time 8    Period Weeks    Status On-going      PT LONG TERM GOAL #4   Title she will improve shoulder flexion to 120 degrees to ease pain with self care    Baseline 105 deegrees at eval    Time 8    Period Weeks    Status On-going                 Plan - 03/11/20 1402    Clinical Impression Statement Patient continues to have consistent R shoulder pain and L knee pain with painful crepitus during some movements. LAQ limited to 1 set secondary to patient expressing painful crepitus. She was able to complete all other exercises this session with fatigue and persistent existing pain but no significant increase in pain. She had difficulty performing sit<>stand with tendency to extend L knee and use BUE and RLE to perform but was encouraged to perform placing equal weight through BLE. She verbalized noncompliance with HEP thus far and was encouraged to complete at least a few HEP as tolerated consistently to maintain mobility and strength. She should continue to benefit from PT for R shoulder mobility/strengthening and L knee strengthening as tolerated.    PT Treatment/Interventions ADLs/Self Care Home Management;Cryotherapy;Moist Heat;Therapeutic activities;Neuromuscular re-education;Therapeutic exercise;Patient/family education;Manual techniques;Passive range of motion;Dry needling;Splinting;Taping;Joint Manipulations    PT Next Visit Plan modalities PRN for pain, manual as indicated to address joint restriction/ROM limitation, McConnell tape if desired, L quad/LE and R shoulder ROM and strengthening    PT Home Exercise Plan [id number] and sit<>stand from stable surface with UE support at home if able    Consulted and Agree with Plan of Care Patient            Patient will benefit from skilled therapeutic intervention in order to improve the following deficits and impairments:  Pain,Postural dysfunction,Decreased activity tolerance,Decreased strength,Impaired UE functional use,Increased muscle spasms,Decreased range of motion,Difficulty walking  Visit Diagnosis: Stiffness of right shoulder, not elsewhere classified  Muscle weakness (generalized)  Chronic right shoulder pain  Difficulty in walking, not elsewhere classified  Cramp and spasm     Problem List Patient Active Problem List   Diagnosis Date Noted  . Fibromyalgia 02/24/2020  . Hypertension associated with type 2 diabetes mellitus ([place]) 11/10/2019  . Hyperlipidemia associated with type 2 diabetes mellitus ([place]) 11/10/2019  . Diabetes mellitus ([place]) 10/23/2019  . Hypertension associated with diabetes ([place]) 10/23/2019  . Mixed diabetic hyperlipidemia associated with type 2 diabetes mellitus ([place]) 10/23/2019  . Vitamin D deficiency 10/23/2019  . B12 deficiency 10/23/2019  . Absolute anemia 10/23/2019  . BMI 50.0-59.9, adult ([place]) 10/23/2019  . Class 3 severe obesity with serious comorbidity and body mass index (BMI) of 50.0 to 59.9 in adult ([place]) 06/16/2015  . Abdominal wall mass of suprapubic  region 06/16/2015   PHYSICAL THERAPY DISCHARGE SUMMARY  Visits from Start of Care: 7  Current functional level related to goals / functional outcomes: See above   Remaining deficits: See above   Education / Equipment: See above  Plan: Patient agrees to discharge.  Patient goals were not met. Patient is being discharged due to not returning since the last visit.  ?????    Discharged at this time secondary to attendance policy as patient has multiple cancellations and no shows. She has recently begun OPPT at another location for her neck pain that show evaluation includes goals addressing her knee/LE as well. Called patient and left voicemail to inform her that she may request  new referral from physician if she decides to continue with formal PT here but that she will be D/C at this time per attendance policy.   Haydee Monica, PT, DPT 04/05/20 6:08 PM  [facility] [address] [place], [place], [address] Phone: [phone]   Fax:  [phone]  Name: Jennifer Dickson MRN: [id number] Date of Birth: January 09, 1976

## 2020-04-05 ENCOUNTER — Ambulatory Visit: Payer: Medicaid Other

## 2020-04-20 ENCOUNTER — Ambulatory Visit (INDEPENDENT_AMBULATORY_CARE_PROVIDER_SITE_OTHER): Payer: Medicaid Other | Admitting: Adult Health

## 2020-04-20 ENCOUNTER — Other Ambulatory Visit (INDEPENDENT_AMBULATORY_CARE_PROVIDER_SITE_OTHER): Payer: Self-pay | Admitting: Adult Health

## 2020-04-20 ENCOUNTER — Other Ambulatory Visit: Payer: Self-pay

## 2020-04-20 VITALS — BP 128/81 | HR 77 | Temp 98.2°F | Ht 67.0 in | Wt 330.0 lb

## 2020-04-20 DIAGNOSIS — E559 Vitamin D deficiency, unspecified: Secondary | ICD-10-CM | POA: Diagnosis not present

## 2020-04-20 DIAGNOSIS — E66813 Obesity, class 3: Secondary | ICD-10-CM

## 2020-04-20 DIAGNOSIS — E1169 Type 2 diabetes mellitus with other specified complication: Secondary | ICD-10-CM | POA: Diagnosis not present

## 2020-04-20 DIAGNOSIS — Z6841 Body Mass Index (BMI) 40.0 and over, adult: Secondary | ICD-10-CM | POA: Diagnosis not present

## 2020-04-20 MED ORDER — VITAMIN D (ERGOCALCIFEROL) 1.25 MG (50000 UNIT) PO CAPS: 50000.0000 [IU] | ORAL_CAPSULE | ORAL | 0 refills | Status: DC

## 2020-04-21 NOTE — Progress Notes (Signed)
Chief Complaint:   OBESITY Jennifer Dickson is here to discuss her progress with her obesity treatment plan along with follow-up of her obesity related diagnoses. Jennifer Dickson is on the Category 3 Plan and states she is following her eating plan approximately 60% of the time. [place] states she is not currently exercising.  Today's visit was #: 7 Starting weight: 359 lbs Starting date: 10/23/2019 Today's weight: 330 lbs Today's date: 04/20/2020 Total lbs lost to date: 29 lbs Total lbs lost since last in-office visit: 8  Interim History: Jennifer Dickson has not been in the office since 02/23/2020- been unable to make earlier follow up due to increased pain and family members with acute illness. She has an appointment with PCP tomorrow and labs will be drawn.  Subjective:   1. Vitamin D deficiency Jennifer Dickson's Vitamin D level was 45.1 on 10/23/2019, which is below goal of 50.Marland Kitchen She is currently taking prescription vitamin D 50,000 IU each week. She denies nausea, vomiting or muscle weakness.  2. Type 2 diabetes mellitus with other specified complication, without long-term current use of insulin (Jennifer Dickson) Jennifer Dickson is on Jardiance 10 mg QD and Dulaglutide 0.75 mg once a week, managed by PCP. Her ambulatory fasting BG runs 130-140's. She denies episodes of hypoglycemia.  No results found for: HGBA1C Lab Results  Component Value Date   CREATININE 1.00 05/06/2019   Lab Results  Component Value Date   INSULIN 45.1 (H) 10/23/2019    Assessment/Plan:   1. Vitamin D deficiency Low Vitamin D level contributes to fatigue and are associated with obesity, breast, and colon cancer. She agrees to continue to take prescription Vitamin D @50 ,000 IU every week and will follow-up for routine testing of Vitamin D, at least 2-3 times per year to avoid over-replacement.  - Vitamin D, Ergocalciferol, (DRISDOL) 1.25 MG (50000 UNIT) CAPS capsule; Take 1 capsule (50,000 Units total) by mouth every 7 (seven) days.  Dispense: 4  capsule; Refill: 0  2. Type 2 diabetes mellitus with other specified complication, without long-term current use of insulin ([name]) Good blood sugar control is important to decrease the likelihood of diabetic complications such as nephropathy, neuropathy, limb loss, blindness, coronary artery disease, and death. Intensive lifestyle modification including diet, exercise and weight loss are the first line of treatment for diabetes. Continue category 3 meal plan.  3. Class 3 severe obesity with serious comorbidity and body mass index (BMI) of 50.0 to 59.9 in adult, unspecified obesity type ([name]) Jennifer Dickson is currently in the action stage of change. As such, her goal is to continue with weight loss efforts. She has agreed to the Category 3 Plan.   Handout: Homemade Seasoning, Recipe Guide, Additional Breakfast Options Check fasting labs at next OV  Exercise goals: As is  Behavioral modification strategies: increasing lean protein intake, meal planning and cooking strategies and planning for success.  Jennifer Dickson has agreed to follow-up with our clinic in 3 weeks. She was informed of the importance of frequent follow-up visits to maximize her success with intensive lifestyle modifications for her multiple health conditions.   Objective:   Blood pressure 128/81, pulse 77, temperature 98.2 F (36.8 C), height 5\' 7"  (1.702 m), weight (!) 330 lb (149.7 kg), SpO2 97 %. Body mass index is 51.69 kg/m.  General: Cooperative, alert, well developed, in no acute distress. HEENT: Conjunctivae and lids unremarkable. Cardiovascular: Regular rhythm.  Lungs: Normal work of breathing. Neurologic: No focal deficits.   Lab Results  Component Value Date   CREATININE 1.00 05/06/2019  BUN 16 05/06/2019   NA 137 05/06/2019   K 3.6 05/06/2019   CL 96 (L) 05/06/2019   CO2 27 05/06/2019   Lab Results  Component Value Date   ALT 49 (H) 05/06/2019   AST 38 05/06/2019   ALKPHOS 112 05/06/2019   BILITOT 0.7  05/06/2019   No results found for: HGBA1C Lab Results  Component Value Date   INSULIN 45.1 (H) 10/23/2019   Lab Results  Component Value Date   TSH 1.710 10/23/2019   No results found for: CHOL, HDL, LDLCALC, LDLDIRECT, TRIG, CHOLHDL Lab Results  Component Value Date   WBC 18.7 (H) 05/06/2019   HGB 13.0 05/06/2019   HCT 43.6 05/06/2019   MCV 83.0 05/06/2019   PLT 555 (H) 05/06/2019    Attestation Statements:   Reviewed by clinician on day of visit: allergies, medications, problem list, medical history, surgical history, family history, social history, and previous encounter notes.  Time spent on visit including pre-visit chart review and post-visit care and charting was 32 minutes.   Coral Ceo, am acting as Location manager for Mina Marble, NP.  I have reviewed the above documentation for accuracy and completeness, and I agree with the above. -  Yeimi Debnam d. Prabhnoor Ellenberger, NP-C

## 2020-04-23 ENCOUNTER — Emergency Department (HOSPITAL_COMMUNITY): Payer: Medicaid Other

## 2020-04-23 ENCOUNTER — Emergency Department (HOSPITAL_COMMUNITY)
Admission: EM | Admit: 2020-04-23 | Discharge: 2020-04-24 | Disposition: A | Discharge status: Home / Self Care | Payer: Medicaid Other | Attending: Emergency Medicine | Admitting: Emergency Medicine

## 2020-04-23 ENCOUNTER — Encounter (HOSPITAL_COMMUNITY): Payer: Self-pay | Admitting: Pharmacy Technician

## 2020-04-23 DIAGNOSIS — M542 Cervicalgia: Secondary | ICD-10-CM | POA: Insufficient documentation

## 2020-04-23 DIAGNOSIS — Z20822 Contact with and (suspected) exposure to covid-19: Secondary | ICD-10-CM | POA: Diagnosis not present

## 2020-04-23 DIAGNOSIS — Z7982 Long term (current) use of aspirin: Secondary | ICD-10-CM | POA: Insufficient documentation

## 2020-04-23 DIAGNOSIS — M79662 Pain in left lower leg: Secondary | ICD-10-CM | POA: Diagnosis not present

## 2020-04-23 DIAGNOSIS — Z79899 Other long term (current) drug therapy: Secondary | ICD-10-CM | POA: Insufficient documentation

## 2020-04-23 DIAGNOSIS — Z87891 Personal history of nicotine dependence: Secondary | ICD-10-CM | POA: Diagnosis not present

## 2020-04-23 DIAGNOSIS — M25562 Pain in left knee: Secondary | ICD-10-CM | POA: Diagnosis not present

## 2020-04-23 DIAGNOSIS — M545 Low back pain, unspecified: Secondary | ICD-10-CM | POA: Insufficient documentation

## 2020-04-23 DIAGNOSIS — Z7984 Long term (current) use of oral hypoglycemic drugs: Secondary | ICD-10-CM | POA: Insufficient documentation

## 2020-04-23 DIAGNOSIS — R110 Nausea: Secondary | ICD-10-CM | POA: Diagnosis not present

## 2020-04-23 DIAGNOSIS — Y92410 Unspecified street and highway as the place of occurrence of the external cause: Secondary | ICD-10-CM | POA: Diagnosis not present

## 2020-04-23 DIAGNOSIS — G8929 Other chronic pain: Secondary | ICD-10-CM | POA: Insufficient documentation

## 2020-04-23 DIAGNOSIS — E1169 Type 2 diabetes mellitus with other specified complication: Secondary | ICD-10-CM | POA: Insufficient documentation

## 2020-04-23 DIAGNOSIS — Z7951 Long term (current) use of inhaled steroids: Secondary | ICD-10-CM | POA: Insufficient documentation

## 2020-04-23 DIAGNOSIS — E782 Mixed hyperlipidemia: Secondary | ICD-10-CM | POA: Diagnosis not present

## 2020-04-23 LAB — CBC WITH DIFFERENTIAL/PLATELET
Abs Immature Granulocytes: 0.05 10*3/uL (ref 0.00–0.07)
Basophils Absolute: 0.1 10*3/uL (ref 0.0–0.1)
Basophils Relative: 0 %
Eosinophils Absolute: 0.1 10*3/uL (ref 0.0–0.5)
Eosinophils Relative: 0 %
HCT: 41.9 % (ref 36.0–46.0)
Hemoglobin: 12.9 g/dL (ref 12.0–15.0)
Immature Granulocytes: 0 %
Lymphocytes Relative: 32 %
Lymphs Abs: 4.4 10*3/uL — ABNORMAL HIGH (ref 0.7–4.0)
MCH: 25.5 pg — ABNORMAL LOW (ref 26.0–34.0)
MCHC: 30.8 g/dL (ref 30.0–36.0)
MCV: 82.8 fL (ref 80.0–100.0)
Monocytes Absolute: 0.7 10*3/uL (ref 0.1–1.0)
Monocytes Relative: 5 %
Neutro Abs: 8.4 10*3/uL — ABNORMAL HIGH (ref 1.7–7.7)
Neutrophils Relative %: 63 %
Platelets: 465 10*3/uL — ABNORMAL HIGH (ref 150–400)
RBC: 5.06 MIL/uL (ref 3.87–5.11)
RDW: 17.2 % — ABNORMAL HIGH (ref 11.5–15.5)
WBC: 13.7 10*3/uL — ABNORMAL HIGH (ref 4.0–10.5)
nRBC: 0 % (ref 0.0–0.2)

## 2020-04-23 LAB — BASIC METABOLIC PANEL
Anion gap: 10 (ref 5–15)
BUN: 12 mg/dL (ref 6–20)
CO2: 29 mmol/L (ref 22–32)
Calcium: 9.6 mg/dL (ref 8.9–10.3)
Chloride: 99 mmol/L (ref 98–111)
Creatinine, Ser: 0.66 mg/dL (ref 0.44–1.00)
GFR, Estimated: >60 mL/min (ref >60)
Glucose, Bld: 115 mg/dL — ABNORMAL HIGH (ref 70–99)
Potassium: 2.9 mmol/L — ABNORMAL LOW (ref 3.5–5.1)
Sodium: 138 mmol/L (ref 135–145)

## 2020-04-23 LAB — I-STAT BETA HCG BLOOD, ED (MC, WL, AP ONLY): I-stat hCG, quantitative: <5 m[IU]/mL (ref <5)

## 2020-04-23 LAB — CBG MONITORING, ED: Glucose-Capillary: 118 mg/dL — ABNORMAL HIGH (ref 70–99)

## 2020-04-23 LAB — RESP PANEL BY RT-PCR (FLU A&B, COVID) ARPGX2
Influenza A by PCR: NEGATIVE
Influenza B by PCR: NEGATIVE
SARS Coronavirus 2 by RT PCR: NEGATIVE

## 2020-04-23 MED ORDER — ACETAMINOPHEN 500 MG PO TABS
1000.0000 mg | ORAL_TABLET | Freq: Once | ORAL | Status: AC
  Administered 2020-04-23: 1000 mg via ORAL
  Filled 2020-04-23: qty 2

## 2020-04-23 MED ORDER — LORAZEPAM 2 MG/ML IJ SOLN
1.0000 mg | Freq: Once | INTRAMUSCULAR | Status: DC
  Filled 2020-04-23: qty 1

## 2020-04-23 MED ORDER — POTASSIUM CHLORIDE CRYS ER 20 MEQ PO TBCR: 40.0000 meq | EXTENDED_RELEASE_TABLET | Freq: Once | ORAL | Status: DC

## 2020-04-23 NOTE — ED Notes (Signed)
Pt feeling like blood sugar is low. Asking for CBG test. Triage nurse notified. Tested, found to be 118.
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
## 2020-04-23 NOTE — ED Notes (Signed)
Called for vitals. In CT at the moment.
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
## 2020-04-23 NOTE — Discharge Instructions (Addendum)
Please follow closely with Dr. Ellene Route. Keep c-collar in place at all times. You can continue to treat your symptoms with prescribed medications as directed. Ice 20 minutes at a time. Return if new numbness/weakness, or other concerning symptoms.
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
## 2020-05-07 ENCOUNTER — Other Ambulatory Visit (HOSPITAL_COMMUNITY): Payer: Self-pay | Admitting: Student

## 2020-05-07 DIAGNOSIS — M5481 Occipital neuralgia: Secondary | ICD-10-CM

## 2020-05-07 DIAGNOSIS — M5412 Radiculopathy, cervical region: Secondary | ICD-10-CM

## 2020-05-07 DIAGNOSIS — M5416 Radiculopathy, lumbar region: Secondary | ICD-10-CM

## 2020-05-11 ENCOUNTER — Telehealth (HOSPITAL_COMMUNITY): Payer: Self-pay

## 2020-05-13 ENCOUNTER — Telehealth (HOSPITAL_COMMUNITY): Payer: Self-pay

## 2020-05-17 ENCOUNTER — Telehealth (HOSPITAL_COMMUNITY): Payer: Self-pay

## 2020-05-18 ENCOUNTER — Ambulatory Visit (INDEPENDENT_AMBULATORY_CARE_PROVIDER_SITE_OTHER): Payer: Medicaid Other | Admitting: Adult Health

## 2020-06-01 ENCOUNTER — Ambulatory Visit (HOSPITAL_COMMUNITY): Payer: No Typology Code available for payment source

## 2020-06-01 ENCOUNTER — Other Ambulatory Visit (HOSPITAL_COMMUNITY): Payer: Medicaid Other

## 2020-06-03 ENCOUNTER — Other Ambulatory Visit (INDEPENDENT_AMBULATORY_CARE_PROVIDER_SITE_OTHER): Payer: Self-pay | Admitting: Adult Health

## 2020-06-03 DIAGNOSIS — E559 Vitamin D deficiency, unspecified: Secondary | ICD-10-CM

## 2020-06-08 ENCOUNTER — Ambulatory Visit (INDEPENDENT_AMBULATORY_CARE_PROVIDER_SITE_OTHER): Payer: Medicaid Other | Admitting: Adult Health

## 2020-06-08 ENCOUNTER — Other Ambulatory Visit (INDEPENDENT_AMBULATORY_CARE_PROVIDER_SITE_OTHER): Payer: Self-pay | Admitting: Adult Health

## 2020-06-08 ENCOUNTER — Other Ambulatory Visit: Payer: Self-pay

## 2020-06-08 VITALS — BP 123/91 | HR 90 | Temp 97.8°F | Ht 67.0 in | Wt 330.0 lb

## 2020-06-08 DIAGNOSIS — E559 Vitamin D deficiency, unspecified: Secondary | ICD-10-CM

## 2020-06-08 DIAGNOSIS — R0789 Other chest pain: Secondary | ICD-10-CM

## 2020-06-08 DIAGNOSIS — Z6841 Body Mass Index (BMI) 40.0 and over, adult: Secondary | ICD-10-CM

## 2020-06-08 DIAGNOSIS — E1169 Type 2 diabetes mellitus with other specified complication: Secondary | ICD-10-CM | POA: Diagnosis not present

## 2020-06-08 MED ORDER — VITAMIN D (ERGOCALCIFEROL) 1.25 MG (50000 UNIT) PO CAPS: 50000.0000 [IU] | ORAL_CAPSULE | ORAL | 0 refills | Status: DC

## 2020-06-09 NOTE — Telephone Encounter (Signed)
Pt last seen by Katy Danford, FNP.  

## 2020-06-10 DIAGNOSIS — R0789 Other chest pain: Secondary | ICD-10-CM | POA: Insufficient documentation
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
## 2020-07-06 ENCOUNTER — Other Ambulatory Visit: Payer: Self-pay

## 2020-07-06 ENCOUNTER — Encounter (INDEPENDENT_AMBULATORY_CARE_PROVIDER_SITE_OTHER): Payer: Self-pay | Admitting: Adult Health

## 2020-07-06 ENCOUNTER — Other Ambulatory Visit (INDEPENDENT_AMBULATORY_CARE_PROVIDER_SITE_OTHER): Payer: Self-pay | Admitting: Adult Health

## 2020-07-06 ENCOUNTER — Telehealth (INDEPENDENT_AMBULATORY_CARE_PROVIDER_SITE_OTHER): Payer: Medicaid Other | Admitting: Adult Health

## 2020-07-06 DIAGNOSIS — R059 Cough, unspecified: Secondary | ICD-10-CM | POA: Diagnosis not present

## 2020-07-06 DIAGNOSIS — Z6841 Body Mass Index (BMI) 40.0 and over, adult: Secondary | ICD-10-CM

## 2020-07-06 DIAGNOSIS — I152 Hypertension secondary to endocrine disorders: Secondary | ICD-10-CM

## 2020-07-06 DIAGNOSIS — E559 Vitamin D deficiency, unspecified: Secondary | ICD-10-CM

## 2020-07-06 DIAGNOSIS — E1159 Type 2 diabetes mellitus with other circulatory complications: Secondary | ICD-10-CM

## 2020-07-06 MED ORDER — VITAMIN D (ERGOCALCIFEROL) 1.25 MG (50000 UNIT) PO CAPS: 50000.0000 [IU] | ORAL_CAPSULE | ORAL | 0 refills | Status: DC

## 2020-07-07 DIAGNOSIS — R059 Cough, unspecified: Secondary | ICD-10-CM | POA: Insufficient documentation

## 2020-07-07 NOTE — Progress Notes (Signed)
TeleHealth Visit:  Due to the COVID-19 pandemic, this visit was completed with telemedicine (audio/video) technology to reduce patient and provider exposure as well as to preserve personal protective equipment.   Jennifer Dickson has verbally consented to this TeleHealth visit. The patient is located at home, the provider is located at the [facility] office. The participants in this visit include the listed provider and patient. The visit was conducted today via video.  Chief Complaint: OBESITY Jennifer Dickson is here to discuss her progress with her obesity treatment plan along with follow-up of her obesity related diagnoses. Jennifer Dickson is on the Category 3 Plan and states she is following her eating plan approximately 60% of the time. Jennifer Dickson states she is not currently exercising.  Today's visit was #: 9 Starting weight: 359 lbs Starting date: 10/23/2019  Interim History: Jennifer Dickson is experiencing a non-productive cough for the past 6 days. She is using OTC Vicks remedy. She is fully vaccinated and boosted with Pfizer MRA COVID-19 vaccine.  Pt has not tested for COVID-19.  Jennifer Dickson was seen by her PCP, Jennifer Spencer, PA-C on 06/16/2020: -started on Linzess 72 mcg for constipation -amlodipine 5 mg was d/c due to lower extremity edema -started on losartan 50 mg QD -continued on Coreg 3.125 mg BID -Protonix 40 mg was d/c, restarted on Nexium 40 mg QD -Restoril d/c, started on Trazodone 100 mg  Subjective:   1. Vitamin D deficiency She is currently taking prescription vitamin D 50,000 IU each week. She denies nausea, vomiting or muscle weakness.  Lab Results  Component Value Date   VD25OH 28.3 (L) 10/23/2019   2. Hypertension associated with type 2 diabetes mellitus ([place]) Jennifer Dickson is on losartan 50 mg and Coreg 3.125 mg BID. Ambulatory BP 114/80.  BP Readings from Last 3 Encounters:  06/08/20 (!) 123/91  04/24/20 119/78  04/20/20 128/81   3. Cough Cough present. Jennifer Dickson is using  OTC Vicks cough medicine. She feels that pre-existing SOB is stable.   Assessment/Plan:   1. Vitamin D deficiency Low Vitamin D level contributes to fatigue and are associated with obesity, breast, and colon cancer. She agrees to continue to take prescription Vitamin D @50 ,000 IU every week and will follow-up for routine testing of Vitamin D, at least 2-3 times per year to avoid over-replacement.  Refill- Vitamin D, Ergocalciferol, (DRISDOL) 1.25 MG (50000 UNIT) CAPS capsule; Take 1 capsule (50,000 Units total) by mouth every 7 (seven) days.  Dispense: 4 capsule; Refill: 0  2. Hypertension associated with type 2 diabetes mellitus ([place]) Jennifer Dickson is working on healthy weight loss and exercise to improve blood pressure control. We will watch for signs of hypotension as she continues her lifestyle modifications. Continue current anti-hypertensive regimen.  3. Cough Continue OTC remedies. If SOB worsens, seek immediate medical assistance.  4. Class 3 severe obesity with serious comorbidity and body mass index (BMI) of 50.0 to 59.9 in adult, unspecified obesity type ([name])  Jennifer Dickson is currently in the action stage of change. As such, her goal is to continue with weight loss efforts. She has agreed to the Category 3 Plan.   Increase rest  Increase fluids Visit [email or web address] to order home antigen COVID-19 test- recommend testing for COVID-19 infection if sx's persist.   Exercise goals: No exercise has been prescribed at this time.  Behavioral modification strategies: increasing lean protein intake, decreasing simple carbohydrates, meal planning and cooking strategies, keeping healthy foods in the home, and planning for success.  Jennifer Dickson has agreed to follow-up  with our clinic in 4 weeks. She was informed of the importance of frequent follow-up visits to maximize her success with intensive lifestyle modifications for her multiple health conditions.  Objective:   VITALS: Per patient if  applicable, see vitals. GENERAL: Alert and in no acute distress. CARDIOPULMONARY: No increased WOB. Speaking in clear sentences.  PSYCH: Pleasant and cooperative. Speech normal rate and rhythm. Affect is appropriate. Insight and judgement are appropriate. Attention is focused, linear, and appropriate.  NEURO: Oriented as arrived to appointment on time with no prompting.   Lab Results  Component Value Date   CREATININE 0.66 04/23/2020   BUN 12 04/23/2020   NA 138 04/23/2020   K 2.9 (L) 04/23/2020   CL 99 04/23/2020   CO2 29 04/23/2020   Lab Results  Component Value Date   ALT 49 (H) 05/06/2019   AST 38 05/06/2019   ALKPHOS 112 05/06/2019   BILITOT 0.7 05/06/2019   No results found for: HGBA1C Lab Results  Component Value Date   INSULIN 45.1 (H) 10/23/2019   Lab Results  Component Value Date   TSH 1.710 10/23/2019   No results found for: CHOL, HDL, LDLCALC, LDLDIRECT, TRIG, CHOLHDL Lab Results  Component Value Date   VD25OH 28.3 (L) 10/23/2019   Lab Results  Component Value Date   WBC 13.7 (H) 04/23/2020   HGB 12.9 04/23/2020   HCT 41.9 04/23/2020   MCV 82.8 04/23/2020   PLT 465 (H) 04/23/2020   No results found for: IRON, TIBC, FERRITIN  Attestation Statements:   Reviewed by clinician on day of visit: allergies, medications, problem list, medical history, surgical history, family history, social history, and previous encounter notes.  Coral Ceo, CMA, am acting as transcriptionist for Mina Marble, NP.  I have reviewed the above documentation for accuracy and completeness, and I agree with the above. - Cormac Wint d. Shaquela Weichert, NP-C

## 2020-08-02 ENCOUNTER — Ambulatory Visit (INDEPENDENT_AMBULATORY_CARE_PROVIDER_SITE_OTHER): Payer: Medicaid Other | Admitting: Adult Health

## 2020-08-02 ENCOUNTER — Encounter (INDEPENDENT_AMBULATORY_CARE_PROVIDER_SITE_OTHER): Payer: Self-pay | Admitting: Adult Health

## 2020-08-02 ENCOUNTER — Other Ambulatory Visit (INDEPENDENT_AMBULATORY_CARE_PROVIDER_SITE_OTHER): Payer: Self-pay | Admitting: Adult Health

## 2020-08-02 ENCOUNTER — Other Ambulatory Visit: Payer: Self-pay

## 2020-08-02 VITALS — BP 124/87 | HR 93 | Temp 97.9°F | Ht 67.0 in | Wt 318.0 lb

## 2020-08-02 DIAGNOSIS — E1159 Type 2 diabetes mellitus with other circulatory complications: Secondary | ICD-10-CM

## 2020-08-02 DIAGNOSIS — Z6841 Body Mass Index (BMI) 40.0 and over, adult: Secondary | ICD-10-CM

## 2020-08-02 DIAGNOSIS — E559 Vitamin D deficiency, unspecified: Secondary | ICD-10-CM

## 2020-08-02 DIAGNOSIS — E1169 Type 2 diabetes mellitus with other specified complication: Secondary | ICD-10-CM | POA: Diagnosis not present

## 2020-08-02 DIAGNOSIS — I152 Hypertension secondary to endocrine disorders: Secondary | ICD-10-CM

## 2020-08-02 MED ORDER — VITAMIN D (ERGOCALCIFEROL) 1.25 MG (50000 UNIT) PO CAPS: 50000.0000 [IU] | ORAL_CAPSULE | ORAL | 0 refills | Status: DC

## 2020-08-02 NOTE — Telephone Encounter (Signed)
Last seen Katy 

## 2020-08-03 LAB — COMPREHENSIVE METABOLIC PANEL
ALT: 24 IU/L (ref 0–32)
AST: 18 IU/L (ref 0–40)
Albumin/Globulin Ratio: 1.4 (ref 1.2–2.2)
Albumin: 4.4 g/dL (ref 3.8–4.8)
Alkaline Phosphatase: 157 IU/L — ABNORMAL HIGH (ref 44–121)
BUN/Creatinine Ratio: 18 (ref 9–23)
BUN: 14 mg/dL (ref 6–24)
Bilirubin Total: 0.5 mg/dL (ref 0.0–1.2)
CO2: 29 mmol/L (ref 20–29)
Calcium: 10.1 mg/dL (ref 8.7–10.2)
Chloride: 97 mmol/L (ref 96–106)
Creatinine, Ser: 0.76 mg/dL (ref 0.57–1.00)
Globulin, Total: 3.2 g/dL (ref 1.5–4.5)
Glucose: 118 mg/dL — ABNORMAL HIGH (ref 65–99)
Potassium: 3.9 mmol/L (ref 3.5–5.2)
Sodium: 140 mmol/L (ref 134–144)
Total Protein: 7.6 g/dL (ref 6.0–8.5)
eGFR: 98 mL/min/{1.73_m2} (ref >59)

## 2020-08-03 LAB — VITAMIN D 25 HYDROXY (VIT D DEFICIENCY, FRACTURES): Vit D, 25-Hydroxy: 61 ng/mL (ref 30.0–100.0)

## 2020-08-03 LAB — HEMOGLOBIN A1C
Est. average glucose Bld gHb Est-mCnc: 143 mg/dL
Hgb A1c MFr Bld: 6.6 % — ABNORMAL HIGH (ref 4.8–5.6)

## 2020-08-03 LAB — INSULIN, RANDOM: INSULIN: 25.9 u[IU]/mL — ABNORMAL HIGH (ref 2.6–24.9)

## 2020-08-04 NOTE — Progress Notes (Signed)
Chief Complaint:   OBESITY Jennifer Dickson is here to discuss her progress with her obesity treatment plan along with follow-up of her obesity related diagnoses. Jennifer Dickson is on the Category 3 Plan and states she is following her eating plan approximately 40% of the time. Jennifer Dickson states she is not currently exercising.  Today's visit was #: 10 Starting weight: 359 lbs Starting date: 10/23/2019 Today's weight: 318 lbs Today's date: 08/02/2020 Total lbs lost to date: 41 Total lbs lost since last in-office visit: 12  Interim History: Jennifer Dickson is limited by finances toe at on plan 100%. She estimates to hit goals 40% of the time. She is down an additional 12 lbs since her last OV.  Subjective:   1. Vitamin D deficiency 10/23/2019 Vit D level was 28.3 - below goal of 50. She is currently taking prescription vitamin D 50,000 IU each week. She denies nausea, vomiting or muscle weakness.   2. Type 2 diabetes mellitus with other specified complication, without long-term current use of insulin ([place]) Ayrica was started on Trulicity [id number] mg once a week by her PCP on 01/07/2020.  Ambulatory fasting BG runs 100-110's.  She is on Jardiance 10 mg QD.  3. Hypertension associated with type 2 diabetes mellitus ([name]) BP/HR are excellent at OV. Her PCP referred her to cardiology for persistent palpitations and significant family history of cardiac disease.  Assessment/Plan:   1. Vitamin D deficiency Low Vitamin D level contributes to fatigue and are associated with obesity, breast, and colon cancer. She agrees to continue to take prescription Vitamin D '@50'$ ,000 IU every week and will follow-up for routine testing of Vitamin D, at least 2-3 times per year to avoid over-replacement. Check labs today.  Refill- Vitamin D, Ergocalciferol, (DRISDOL) 1.25 MG (50000 UNIT) CAPS capsule; Take 1 capsule (50,000 Units total) by mouth every 7 (seven) days.  Dispense: 4 capsule; Refill: 0  - VITAMIN D 25 Hydroxy  (Vit-D Deficiency, Fractures)  2. Type 2 diabetes mellitus with other specified complication, without long-term current use of insulin ([name]) Good blood sugar control is important to decrease the likelihood of diabetic complications such as nephropathy, neuropathy, limb loss, blindness, coronary artery disease, and death. Intensive lifestyle modification including diet, exercise and weight loss are the first line of treatment for diabetes.  Check labs today.  - Hemoglobin A1c - Insulin, random  3. Hypertension associated with type 2 diabetes mellitus ([place]) Jennifer Dickson is working on healthy weight loss and exercise to improve blood pressure control. We will watch for signs of hypotension as she continues her lifestyle modifications. Check labs today.  - Comprehensive metabolic panel  4. Obesity with current BMI 49.8  Jennifer Dickson is currently in the action stage of change. As such, her goal is to continue with weight loss efforts. She has agreed to the Category 3 Plan.   Exercise goals:  Banded exercise 2 times a week.  Behavioral modification strategies: increasing lean protein intake, decreasing simple carbohydrates, meal planning and cooking strategies, keeping healthy foods in the home, and planning for success.  [name] has agreed to follow-up with our clinic in 2-4 weeks. She was informed of the importance of frequent follow-up visits to maximize her success with intensive lifestyle modifications for her multiple health conditions.   Jennifer Dickson was informed we would discuss her lab results at her next visit unless there is a critical issue that needs to be addressed sooner. Jennifer Dickson agreed to keep her next visit at the agreed upon time to discuss these  results.  Objective:   Blood pressure 124/87, pulse 93, temperature 97.9 F (36.6 C), height '5\' 7"'$  (1.702 m), weight (!) 318 lb (144.2 kg), SpO2 100 %. Body mass index is 49.81 kg/m.  General: Cooperative, alert, well developed, in no acute  distress. HEENT: Conjunctivae and lids unremarkable. Cardiovascular: Regular rhythm.  Lungs: Normal work of breathing. Neurologic: No focal deficits.   Lab Results  Component Value Date   CREATININE 0.76 08/02/2020   BUN 14 08/02/2020   NA 140 08/02/2020   K 3.9 08/02/2020   CL 97 08/02/2020   CO2 29 08/02/2020   Lab Results  Component Value Date   ALT 24 08/02/2020   AST 18 08/02/2020   ALKPHOS 157 (H) 08/02/2020   BILITOT 0.5 08/02/2020   Lab Results  Component Value Date   HGBA1C 6.6 (H) 08/02/2020   Lab Results  Component Value Date   INSULIN 25.9 (H) 08/02/2020   INSULIN 45.1 (H) 10/23/2019   Lab Results  Component Value Date   TSH 1.710 10/23/2019   No results found for: CHOL, HDL, LDLCALC, LDLDIRECT, TRIG, CHOLHDL Lab Results  Component Value Date   VD25OH 61.0 08/02/2020   VD25OH 28.3 (L) 10/23/2019   Lab Results  Component Value Date   WBC 13.7 (H) 04/23/2020   HGB 12.9 04/23/2020   HCT 41.9 04/23/2020   MCV 82.8 04/23/2020   PLT 465 (H) 04/23/2020   No results found for: IRON, TIBC, FERRITIN  Attestation Statements:   Reviewed by clinician on day of visit: allergies, medications, problem list, medical history, surgical history, family history, social history, and previous encounter notes.  Time spent on visit including pre-visit chart review and post-visit care and charting was 32 minutes.   Coral Ceo, CMA, am acting as transcriptionist for Mina Marble, NP.  I have reviewed the above documentation for accuracy and completeness, and I agree with the above. -  Kaenan Jake d. Madyson Lukach, NP-C

## 2020-08-17 ENCOUNTER — Encounter (INDEPENDENT_AMBULATORY_CARE_PROVIDER_SITE_OTHER): Payer: Self-pay

## 2020-08-17 ENCOUNTER — Ambulatory Visit (INDEPENDENT_AMBULATORY_CARE_PROVIDER_SITE_OTHER): Payer: Medicaid Other | Admitting: Adult Health

## 2020-08-30 ENCOUNTER — Encounter (INDEPENDENT_AMBULATORY_CARE_PROVIDER_SITE_OTHER): Payer: Self-pay | Admitting: Adult Health

## 2020-08-30 ENCOUNTER — Other Ambulatory Visit: Payer: Self-pay

## 2020-08-30 ENCOUNTER — Ambulatory Visit (INDEPENDENT_AMBULATORY_CARE_PROVIDER_SITE_OTHER): Payer: Medicaid Other | Admitting: Adult Health

## 2020-08-30 VITALS — BP 114/83 | HR 89 | Temp 98.2°F | Ht 67.0 in | Wt 319.0 lb

## 2020-08-30 DIAGNOSIS — E1159 Type 2 diabetes mellitus with other circulatory complications: Secondary | ICD-10-CM

## 2020-08-30 DIAGNOSIS — E1169 Type 2 diabetes mellitus with other specified complication: Secondary | ICD-10-CM | POA: Diagnosis not present

## 2020-08-30 DIAGNOSIS — E559 Vitamin D deficiency, unspecified: Secondary | ICD-10-CM

## 2020-08-30 DIAGNOSIS — I152 Hypertension secondary to endocrine disorders: Secondary | ICD-10-CM

## 2020-08-30 DIAGNOSIS — Z6841 Body Mass Index (BMI) 40.0 and over, adult: Secondary | ICD-10-CM

## 2020-08-31 NOTE — Progress Notes (Signed)
Chief Complaint:   OBESITY Jennifer Dickson is here to discuss her progress with her obesity treatment plan along with follow-up of her obesity related diagnoses. Jennifer Dickson is on the Category 3 Plan and states she is following her eating plan approximately 2% of the time. Jennifer Dickson states she is not currently exercising.  Today's visit was #: 11 Starting weight: 359 lbs Starting date: 10/23/2019 Today's weight: 319 lbs Today's date: 08/30/2020 Total lbs lost to date: 40 Total lbs lost since last in-office visit: 0  Interim History: Jennifer Dickson is followed by [facility]- Dr. Percell Miller.  She has chronic neck/thoracic back pain, Right shoulder pain, bilateral knee pain.  In order to have any significant orthopedic procedures, she needs to achieve a BMI <40. Current weight 319, current BMI 50.1 69 lb weight loss would achieve a BMI 39.2 Of Note: Pt's sister passed July 2016, and she is unsure of cause of death. Sister's birthday month, August, is triggering for her.  Subjective:   1. Vitamin D deficiency Discussed labs with patient today. 08/02/2020 Vit D level was 61.0 - at goal. She is currently taking prescription vitamin D 50,000 IU each week. She denies nausea, vomiting or muscle weakness.   2. Hypertension associated with type 2 diabetes mellitus (Jennifer Dickson) Discussed labs with patient today. BP/HR excellent at OV. August's 08/02/2020 CMP is stable.  3. Type 2 diabetes mellitus with other specified complication, without long-term current use of insulin ([name]) Discussed labs with patient today. 08/02/2020 BG 118, A1c 6.6, and insulin level 25.9.  BG and insulin level are above goal, while A1c is at goal.  Jennifer Dickson is on Trulicity [id number] mg once weekly. She is managed by PCP and has been on this GLP-1 dose for 3-4 months.  Assessment/Plan:   1. Vitamin D deficiency Low Vitamin D level contributes to fatigue and are associated with obesity, breast, and colon cancer. She agrees to convert  to OTC Vitamin D 2,000 IU daily and will follow-up for routine testing of Vitamin D, at least 2-3 times per year to avoid over-replacement.  2. Hypertension associated with type 2 diabetes mellitus (Jennifer Dickson) Jennifer Dickson is working on healthy weight loss and exercise to improve blood pressure control. We will watch for signs of hypotension as she continues her lifestyle modifications. Continue current anti-hypertensive regimen.  3. Type 2 diabetes mellitus with other specified complication, without long-term current use of insulin ([name]) Contact PCP about Trulicity dosage. Good blood sugar control is important to decrease the likelihood of diabetic complications such as nephropathy, neuropathy, limb loss, blindness, coronary artery disease, and death. Intensive lifestyle modification including diet, exercise and weight loss are the first line of treatment for diabetes.   4. Obesity with current BMI 50.1  Jennifer Dickson is currently in the action stage of change. As such, her goal is to continue with weight loss efforts. She has agreed to the Category 3 Plan.   Exercise goals: No exercise has been prescribed at this time.  Behavioral modification strategies: increasing lean protein intake, decreasing simple carbohydrates, meal planning and cooking strategies, keeping healthy foods in the home, and planning for success.  Jennifer Dickson has agreed to follow-up with our clinic in 4 weeks. She was informed of the importance of frequent follow-up visits to maximize her success with intensive lifestyle modifications for her multiple health conditions.   Objective:   Blood pressure 114/83, pulse 89, temperature 98.2 F (36.8 C), height '5\' 7"'$  (1.702 m), weight (!) 319 lb (144.7 kg), SpO2 96 %. Body mass  index is 49.96 kg/m.  General: Cooperative, alert, well developed, in no acute distress. HEENT: Conjunctivae and lids unremarkable. Cardiovascular: Regular rhythm.  Lungs: Normal work of breathing. Neurologic: No focal  deficits.   Lab Results  Component Value Date   CREATININE 0.76 08/02/2020   BUN 14 08/02/2020   NA 140 08/02/2020   K 3.9 08/02/2020   CL 97 08/02/2020   CO2 29 08/02/2020   Lab Results  Component Value Date   ALT 24 08/02/2020   AST 18 08/02/2020   ALKPHOS 157 (H) 08/02/2020   BILITOT 0.5 08/02/2020   Lab Results  Component Value Date   HGBA1C 6.6 (H) 08/02/2020   Lab Results  Component Value Date   INSULIN 25.9 (H) 08/02/2020   INSULIN 45.1 (H) 10/23/2019   Lab Results  Component Value Date   TSH 1.710 10/23/2019   No results found for: CHOL, HDL, LDLCALC, LDLDIRECT, TRIG, CHOLHDL Lab Results  Component Value Date   VD25OH 61.0 08/02/2020   VD25OH 28.3 (L) 10/23/2019   Lab Results  Component Value Date   WBC 13.7 (H) 04/23/2020   HGB 12.9 04/23/2020   HCT 41.9 04/23/2020   MCV 82.8 04/23/2020   PLT 465 (H) 04/23/2020    Attestation Statements:   Reviewed by clinician on day of visit: allergies, medications, problem list, medical history, surgical history, family history, social history, and previous encounter notes.  Time spent on visit including pre-visit chart review and post-visit care and charting was 30 minutes.   Coral Ceo, CMA, am acting as transcriptionist for Mina Marble, NP.  I have reviewed the above documentation for accuracy and completeness, and I agree with the above. -  Youa Deloney d. Maecyn Panning, NP-C

## 2020-09-04 IMAGING — DX DG CHEST 2V
2 series · 2 of 2 positions shown · non-contrast
Comparison: 01/18/2019

CLINICAL DATA: Chest pain

EXAM:
CHEST - 2 VIEW

[chest lat]
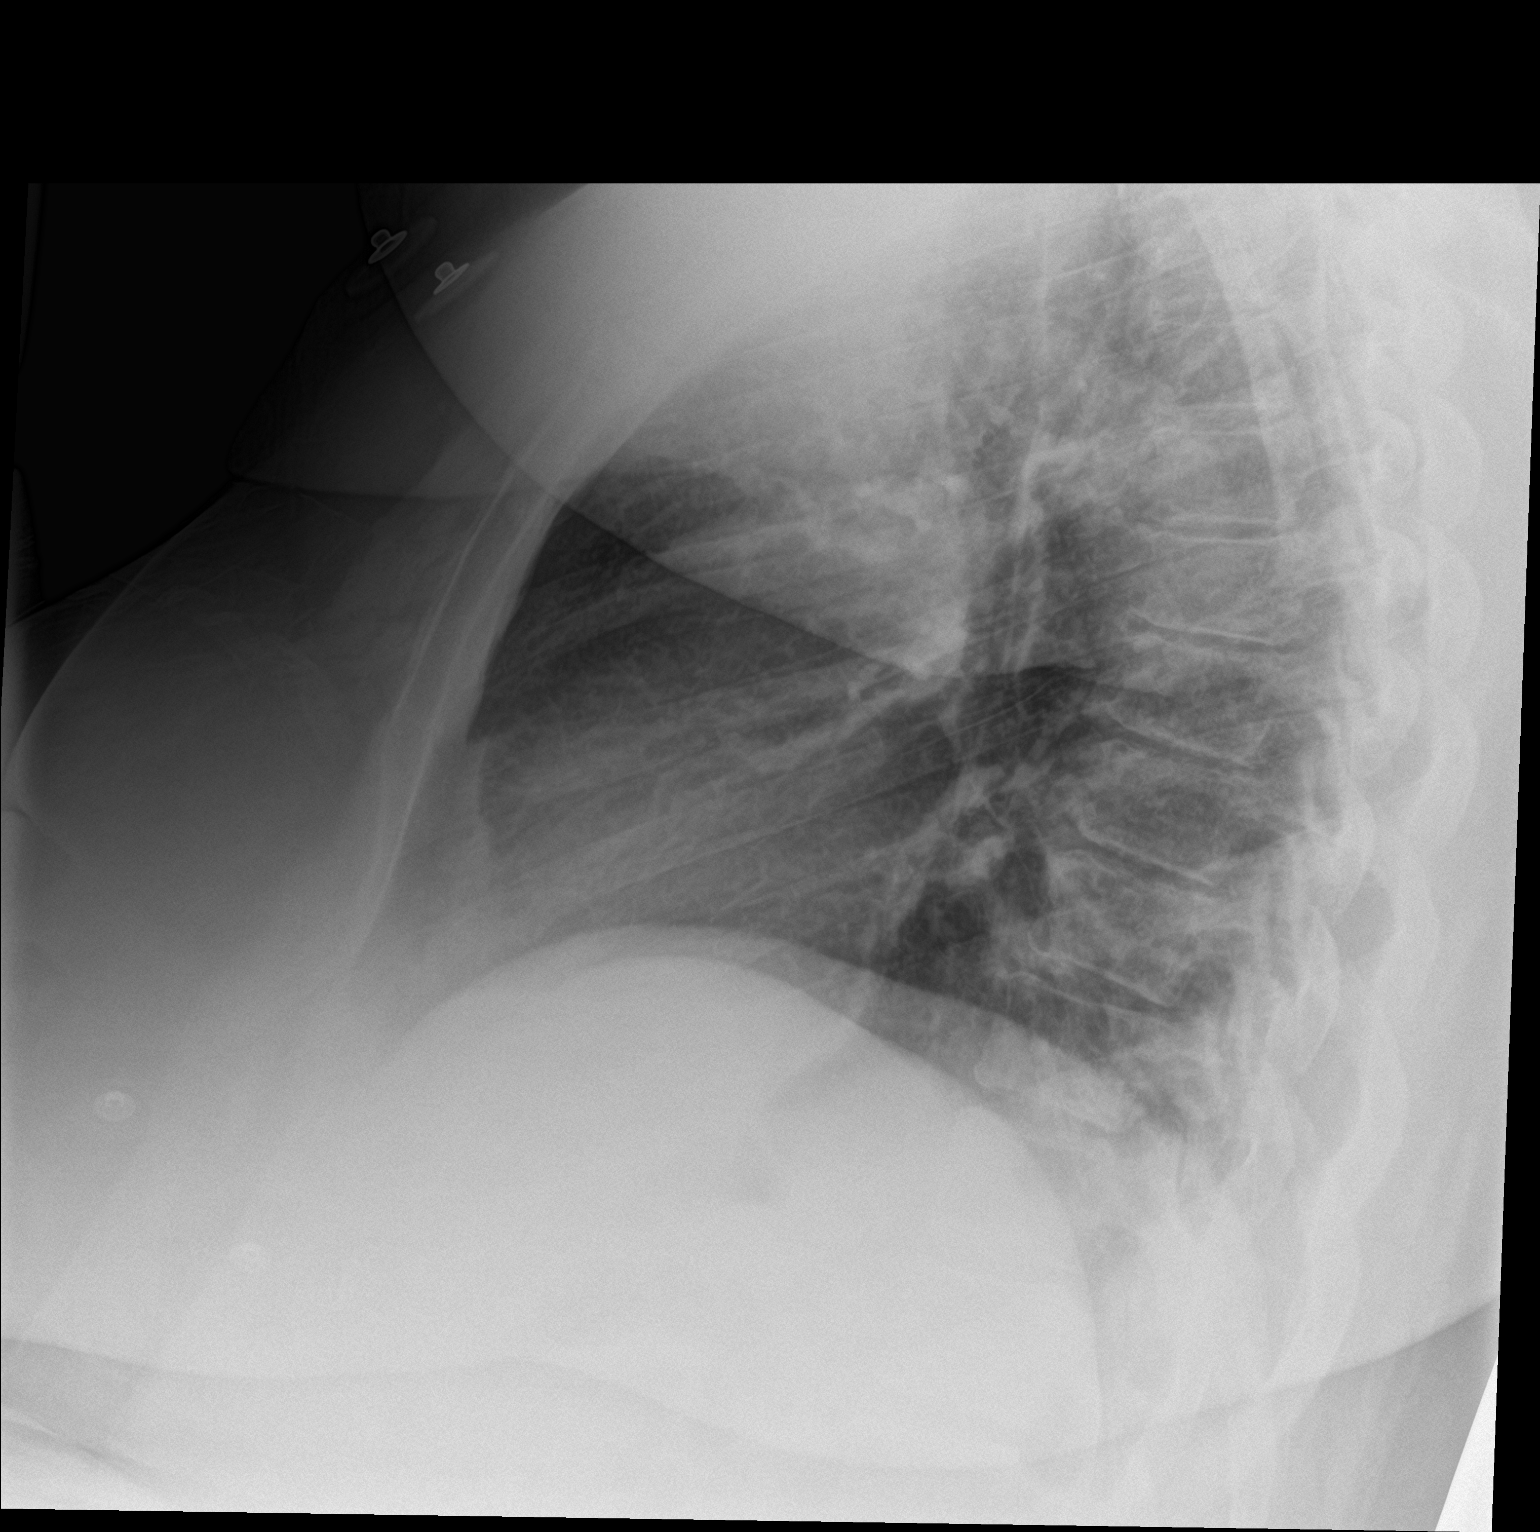

[chest ap]
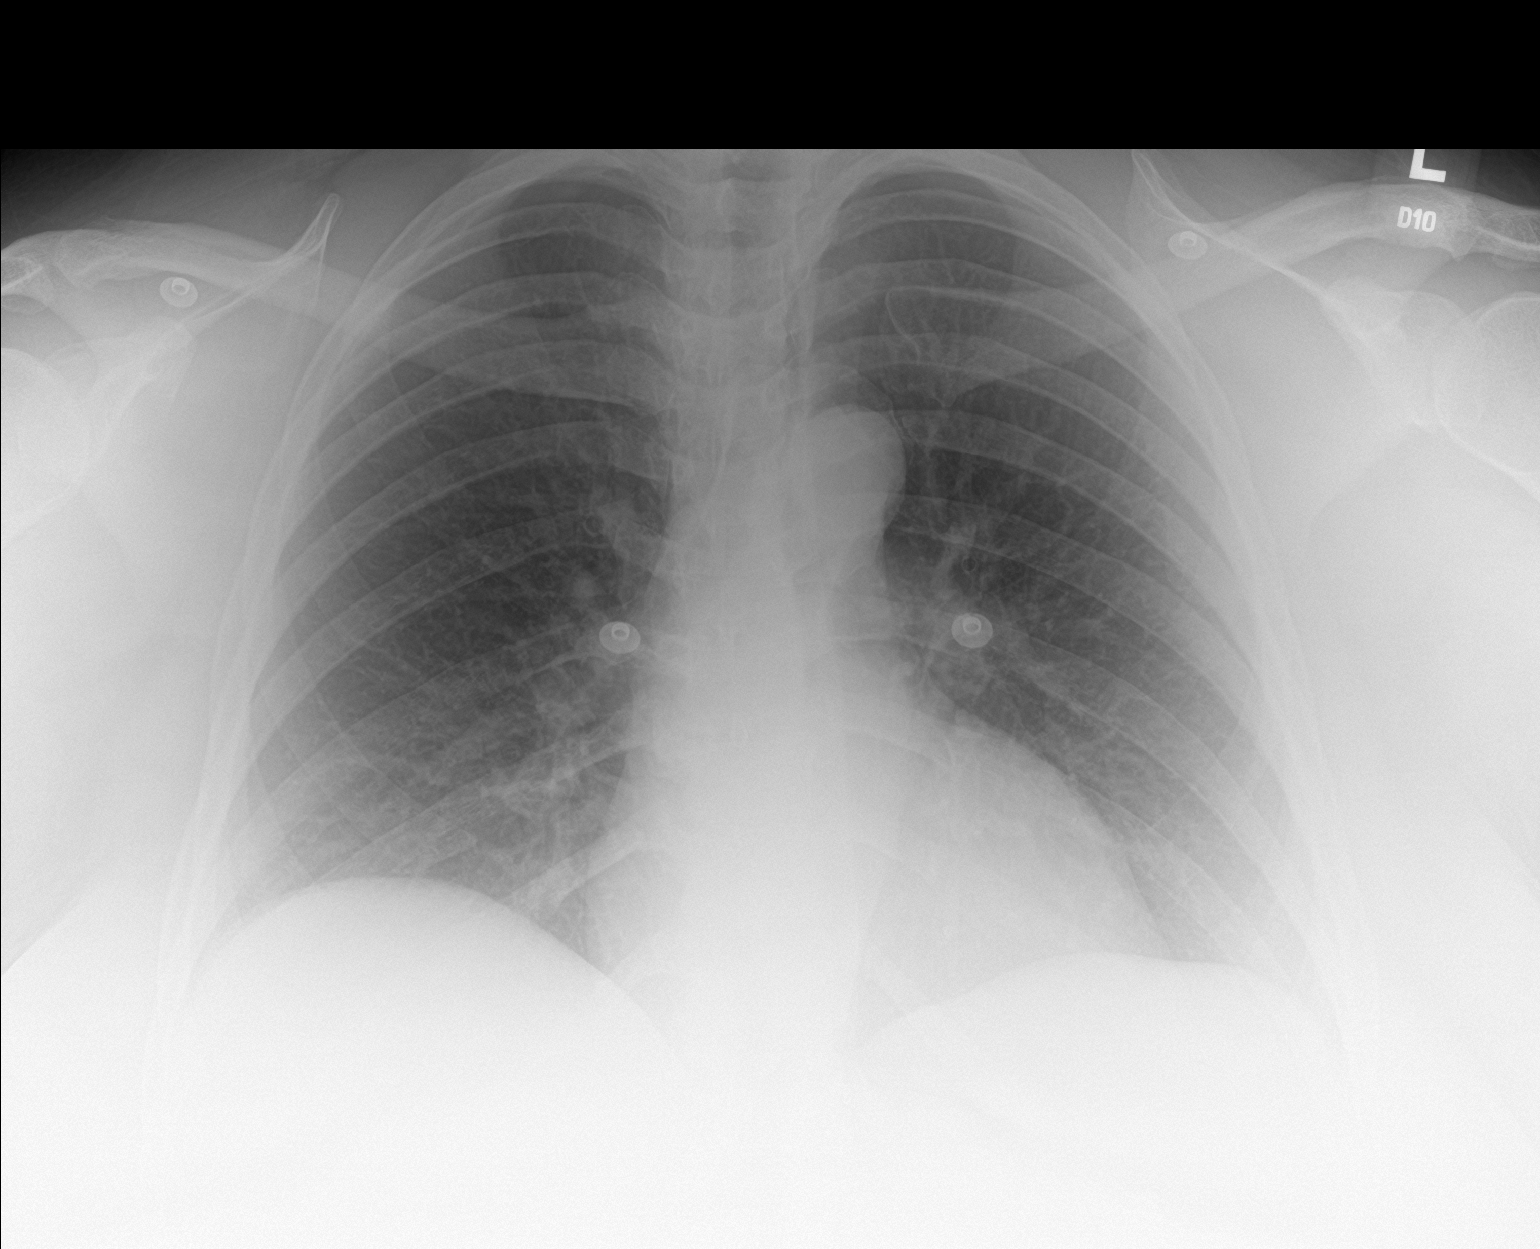

[2 of 2 positions shown; findings below may reference images not displayed]

FINDINGS: The heart size and mediastinal contours are within normal limits.
Both lungs are clear. The visualized skeletal structures are
unremarkable.
IMPRESSION: No active cardiopulmonary disease.

## 2020-09-05 IMAGING — CT CT ABD-PELV W/ CM
2 of 5 series · 15 of 46 positions shown, 17 images · IV contrast (APPLIED)
Comparison: None.

CLINICAL DATA: Chest pain and nausea

EXAM:
CT ANGIOGRAPHY CHEST WITH CONTRAST
TECHNIQUE: Multidetector CT imaging of the chest was performed using the
standard protocol during bolus administration of intravenous
contrast. Multiplanar CT image reconstructions and MIPs were
obtained to evaluate the vascular anatomy.
CONTRAST:  100mL OMNIPAQUE IOHEXOL 300 MG/ML  SOLN

[Series 3: abdomen 5.0 · axial · 0.98mm/px · z∈[+639,+1119]mm · 12 of 112 slices shown, 14 images]
[im 8/112  soft-tissue]
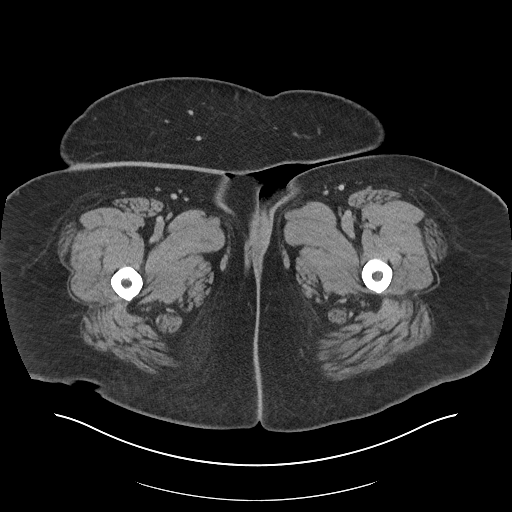
[im 8/112  bone]
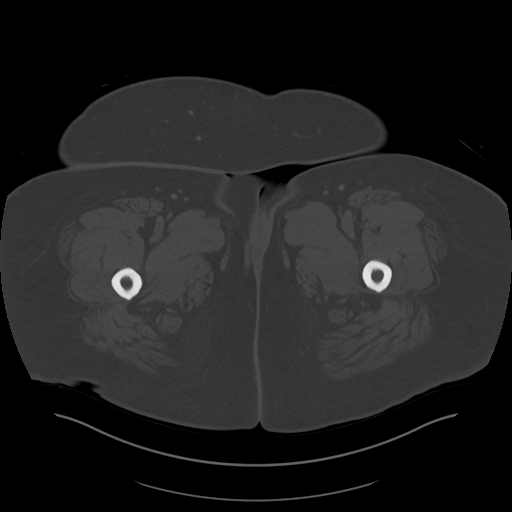
[im 15/112  soft-tissue]
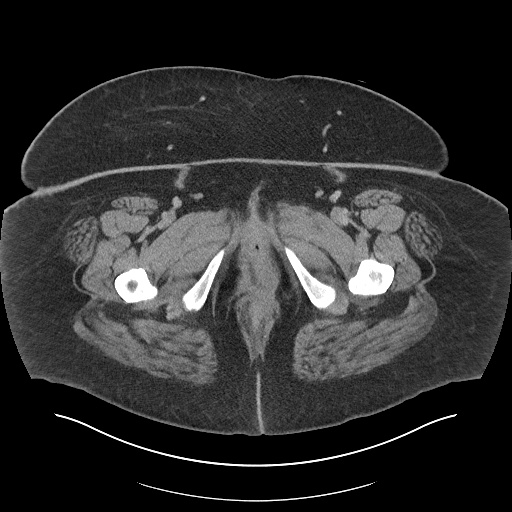
[im 23/112  soft-tissue]
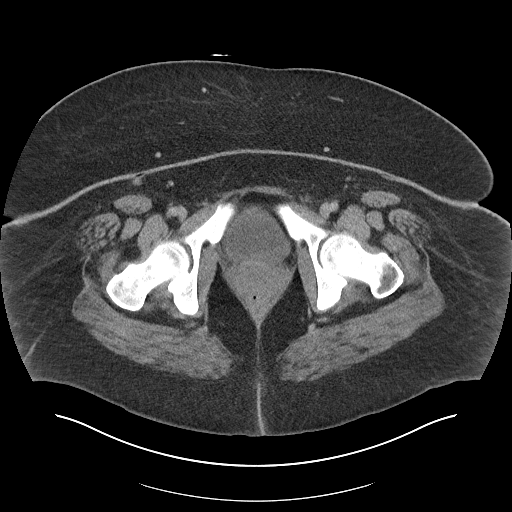
[im 38/112  soft-tissue]
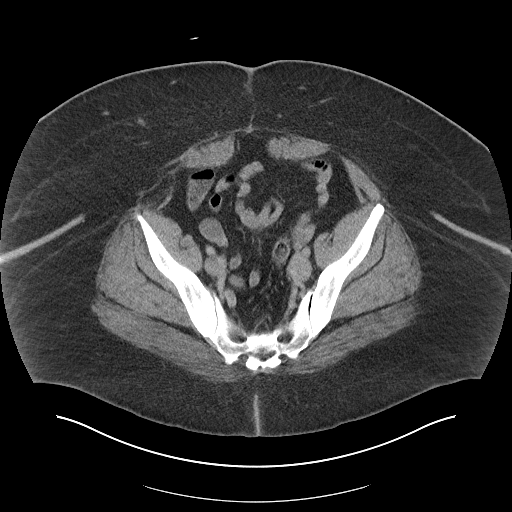
[im 45/112  soft-tissue]
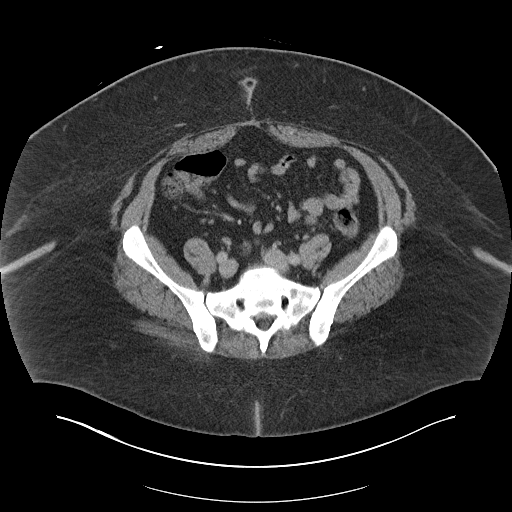
[im 52/112  soft-tissue]
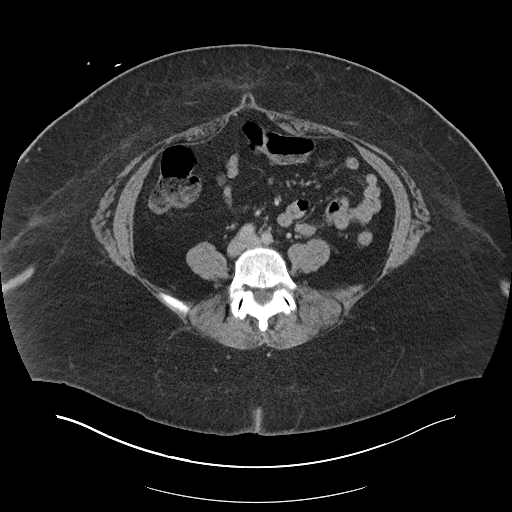
[im 60/112  soft-tissue]
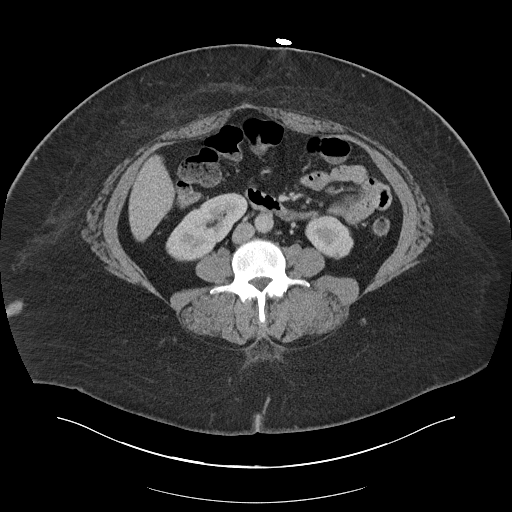
[im 67/112  soft-tissue]
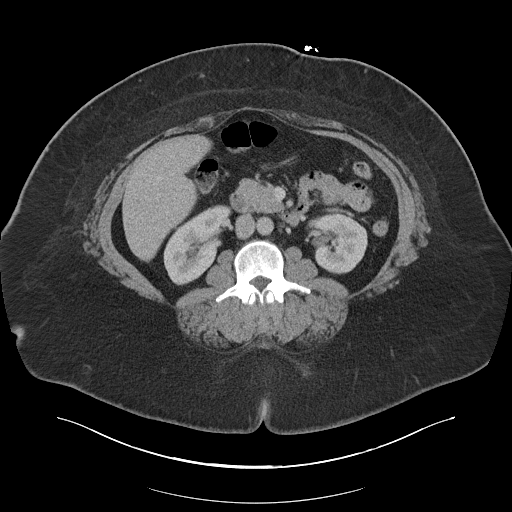
[im 75/112  soft-tissue]
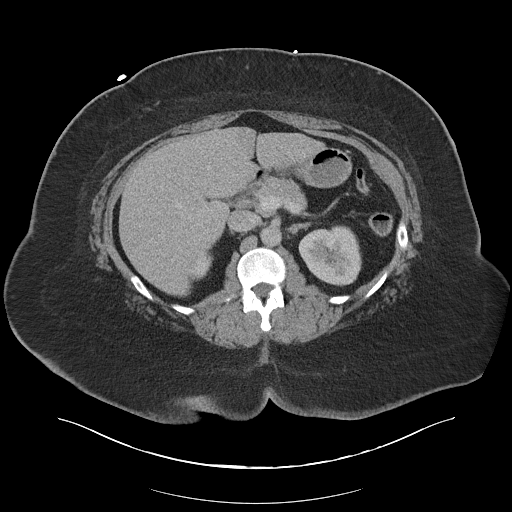
[im 75/112  bone]
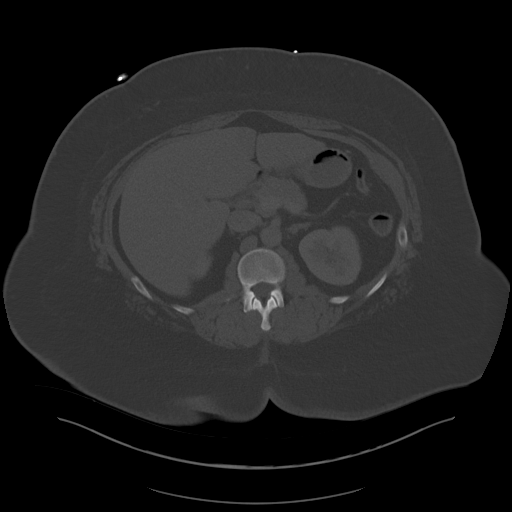
[im 89/112  soft-tissue]
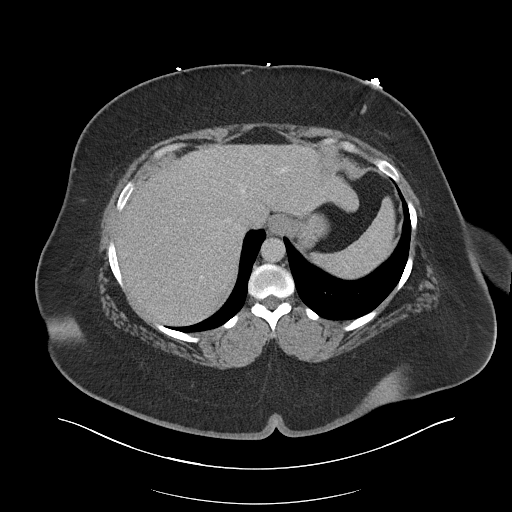
[im 97/112  soft-tissue]
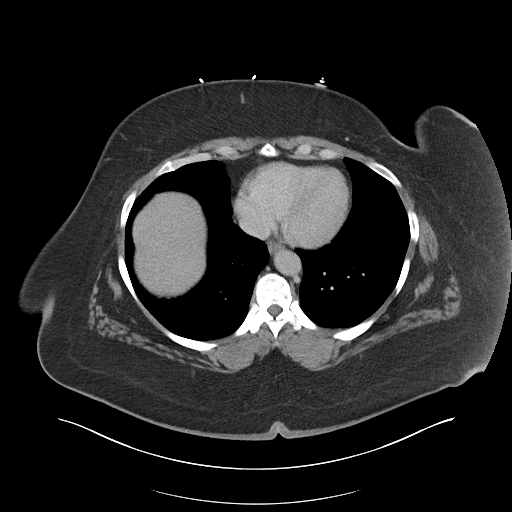
[im 104/112  soft-tissue]
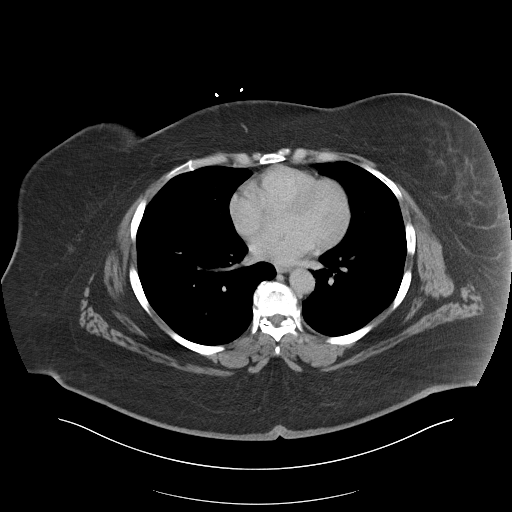

[Series 6: abdomen 3.0 mpr cor · coronal · 1.00mm/px · 3 of 132 slices shown]
[im 44/132  soft-tissue]
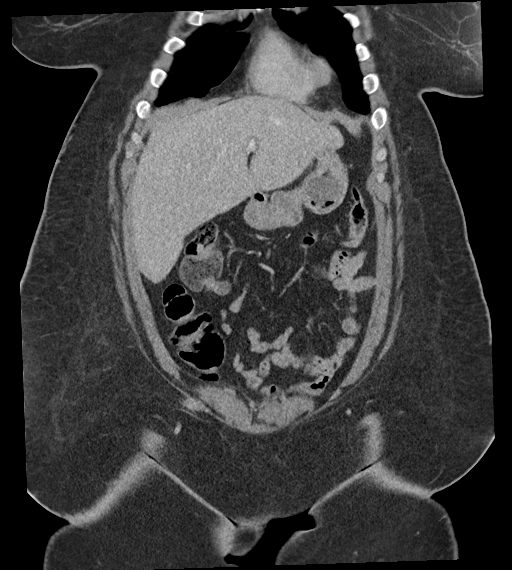
[im 59/132  soft-tissue]
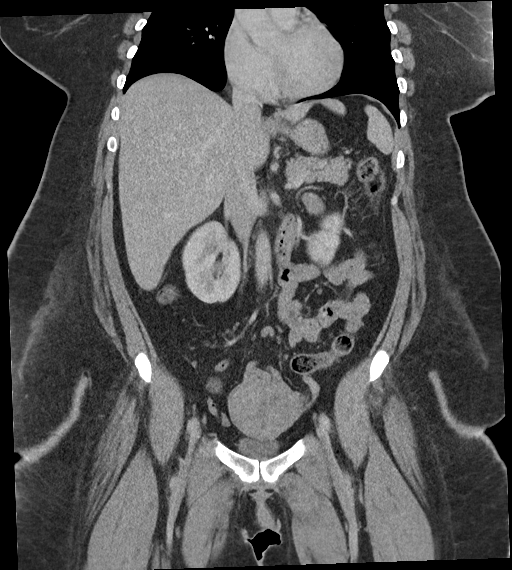
[im 73/132  soft-tissue]
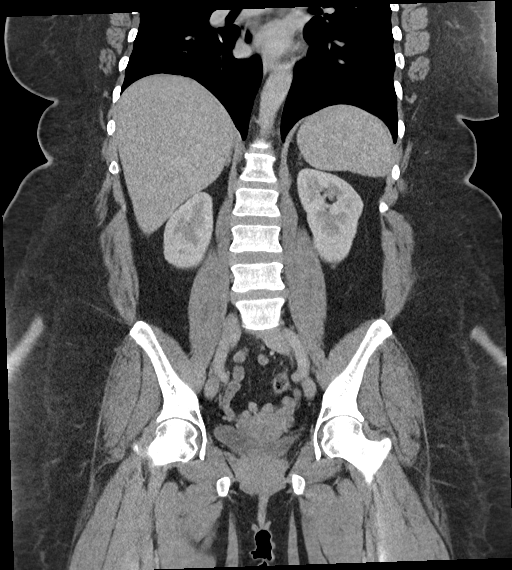

[15 of 46 positions shown; findings below may reference images not displayed]

FINDINGS: Cardiovascular: There is a optimal opacification of the pulmonary
arteries. There is no central,segmental, or subsegmental filling
defects within the pulmonary arteries. The heart is normal in size.
No pericardial effusion or thickening. No evidence right heart
strain. There is normal three-vessel brachiocephalic anatomy without
proximal stenosis. The thoracic aorta is normal in appearance.

Mediastinum/Nodes: No hilar, mediastinal, or axillary adenopathy.
Thyroid gland, trachea, and esophagus demonstrate no significant
findings.

Lungs/Pleura: Minimal bibasilar atelectasis. No pleural effusion or
pneumothorax. No airspace consolidation.

Upper Abdomen: No acute abnormalities present in the visualized
portions of the upper abdomen.

Musculoskeletal: No chest wall abnormality. No acute or significant
osseous findings.

Review of the MIP images confirms the above findings.

Abdomen/pelvis:

Hepatobiliary: The liver is normal in density without focal
abnormality.The main portal vein is patent. The patient is status
post cholecystectomy. No biliary ductal dilation.

Pancreas: Unremarkable. No pancreatic ductal dilatation or
surrounding inflammatory changes.

Spleen: Normal in size without focal abnormality.

Adrenals/Urinary Tract: Both adrenal glands appear normal. The
kidneys and collecting system appear normal without evidence of
urinary tract calculus or hydronephrosis. Bladder is unremarkable.

Stomach/Bowel: The stomach, small bowel, and colon are normal in
appearance. No inflammatory changes, wall thickening, or obstructive
findings.The appendix is normal.

Vascular/Lymphatic: There are no enlarged mesenteric,
retroperitoneal, or pelvic lymph nodes. No significant vascular
findings are present.

Reproductive: There is a probable posterior right uterine fibroid
present.

Other: Again noted within the right lower anterior abdominal wall a
soft tissue nodule which measures 2.4 cm in maximum diameter and is
unchanged from prior exam dating back to 6779.

Musculoskeletal: No acute or significant osseous findings.
IMPRESSION: No central, segmental, or subsegmental pulmonary embolism.

No acute intrathoracic, abdominal, or pelvic pathology to explain
the patient's symptoms.

Unchanged 2.5 cm soft tissue nodule along the right lower anterior
abdominal wall which could represent a scar endometriosis versus
desmoid dating back to 6779.

## 2020-09-14 ENCOUNTER — Encounter: Payer: Self-pay | Admitting: Physical Therapy

## 2020-09-14 ENCOUNTER — Other Ambulatory Visit: Payer: Self-pay

## 2020-09-14 ENCOUNTER — Ambulatory Visit: Payer: Medicaid Other | Attending: Orthopedic Surgery | Admitting: Physical Therapy

## 2020-09-14 DIAGNOSIS — G8929 Other chronic pain: Secondary | ICD-10-CM | POA: Diagnosis present

## 2020-09-14 DIAGNOSIS — M25611 Stiffness of right shoulder, not elsewhere classified: Secondary | ICD-10-CM | POA: Insufficient documentation

## 2020-09-14 DIAGNOSIS — R262 Difficulty in walking, not elsewhere classified: Secondary | ICD-10-CM | POA: Diagnosis present

## 2020-09-14 DIAGNOSIS — M6281 Muscle weakness (generalized): Secondary | ICD-10-CM | POA: Insufficient documentation

## 2020-09-14 DIAGNOSIS — M25511 Pain in right shoulder: Secondary | ICD-10-CM | POA: Diagnosis not present

## 2020-09-14 DIAGNOSIS — R252 Cramp and spasm: Secondary | ICD-10-CM | POA: Insufficient documentation

## 2020-09-14 NOTE — Therapy (Signed)
[facility] [address] [place], [place], [place], [address] Phone: [phone]   Fax:  [phone]  Physical Therapy Evaluation  Patient Details  Name: Jennifer Dickson MRN: [id number] Date of Birth: [birth year] Referring Provider (PT): Fredonia Highland MD   Encounter Date: 09/14/2020   PT End of Session - 09/14/20 1440     Visit Number 1    Number of Visits 4    Date for PT Re-Evaluation 10/04/20    Authorization Type MCD [place] Access    Authorization Time Period 09/20/20-10/05/20 (then need to request 2x/wk until 11/19/20)    Authorization - Visit Number 1    Authorization - Number of Visits 4    PT Start Time 1455   had to wait on order   PT Stop Time 1524    PT Time Calculation (min) 29 min    Activity Tolerance Patient tolerated treatment well;Patient limited by pain    Behavior During Therapy [facility] for tasks assessed/performed             Past Medical History:  Diagnosis Date   Anemia    Anxiety    Back pain    Carpal tunnel syndrome    Cervical radiculopathy    Cervical radiculopathy    Chest pain    Chronic fatigue syndrome    Constipation    COVID-19    Depression    Diabetes mellitus without complication ([name])    Diverticulosis    DJD (degenerative joint disease)    DVT (deep vein thrombosis) in pregnancy    DVT (deep venous thrombosis) ([name])    Endometrioma    Endometriosis    Fatty liver    Fibromyalgia    Fibromyalgia    Gallbladder problem    Heartburn    High blood pressure    High cholesterol    Hypertension    IBS (irritable bowel syndrome)    Interstitial cystitis    Joint pain    Knee dislocation    Mini stroke ([name])    Osteoarthritis    Osteoarthritis    Palpitations    Pinched nerve    Sleep apnea    SOB (shortness of breath) on exertion    Swallowing difficulty    Swelling of both lower extremities    Tendonitis    Varicose veins of both legs with edema    Vitamin D  deficiency     Past Surgical History:  Procedure Laterality Date   [place]    There were no vitals filed for this visit.    Subjective Assessment - 09/14/20 1453     Subjective Patient was lifing a large TV last year and it fell into the right shoulder. She had some pain prior to that, but this incident made it worse. She can lift her arm but not as high as left. Throw motion feels like it's going to pop out. Pain goes up into her neck at times. Pain really worsened in July of this year.    Pertinent History fibromayalgia, DM, spinal stenosis (lumbar and cervical), bil knee pain, anemia, obesity    Limitations House hold activities;Other (comment)   limited with how long she can do her hair or dress or ADLS   Diagnostic tests MRI  slap tear    Patient Stated Goals to decrease pain and have surgery to fix it    Currently in Pain? Yes    Pain Score  8     Pain Location Shoulder    Pain Orientation Right    Pain Descriptors / Indicators Throbbing    Pain Type Chronic pain    Pain Radiating Towards into neck at times    Pain Onset More than a month ago    Pain Frequency Constant    Aggravating Factors  use and prolonged use    Pain Relieving Factors ice somewhat    Effect of Pain on Daily Activities limited                [facility] PT Assessment - 09/14/20 0001       Assessment   Medical Diagnosis Rt shoulder SLAP tear/tendonosis Non-op    Referring Provider (PT) Fredonia Highland MD    Onset Date/Surgical Date 07/09/20    Hand Dominance Right    Prior [facility] at [facility]      Precautions   Precautions Fall    Precaution Comments knee gives out sometimes      Restrictions   Weight Bearing Restrictions No      Balance Screen   Has the patient fallen in the past 6 months No    Has the patient had a decrease in activity level because of a fear of falling?  No    Is the patient reluctant to leave their home because of a fear of  falling?  No      Home Ecologist residence    Living Arrangements Alone    Additional Comments one level      Prior Function   Level of Independence Independent with community mobility with device    Vocation On disability      ROM / Strength   AROM / PROM / Strength AROM;PROM;Strength      AROM   Overall AROM Comments pain with all motions; Cervical AROM: Left rot decreased 75%, Rt rot 50%, flex full, ext 75% and painful; SB WFL bil    AROM Assessment Site Shoulder    Right/Left Shoulder Right    Right Shoulder Extension 40 Degrees    Right Shoulder Flexion 125 Degrees    Right Shoulder ABduction 85 Degrees    Right Shoulder Internal Rotation 55 Degrees   thumb to L4 when reaching behind back   Right Shoulder External Rotation 15 Degrees      PROM   Overall PROM Comments pain with all motions    PROM Assessment Site Shoulder    Right/Left Shoulder Right    Right Shoulder Extension 40 Degrees   feels like it will pop out   Right Shoulder Flexion 130 Degrees    Right Shoulder ABduction 90 Degrees    Right Shoulder Internal Rotation 60 Degrees    Right Shoulder External Rotation 22 Degrees      Strength   Strength Assessment Site Shoulder    Right/Left Shoulder Right    Right Shoulder Flexion 3-/5    Right Shoulder Extension 3+/5    Right Shoulder ABduction 3-/5    Right Shoulder Internal Rotation 4-/5    Right Shoulder External Rotation 4-/5      Palpation   Palpation comment tender in right pectorals, in SITS tendons, Rt UT and parascapular mucles.                        Objective measurements completed on examination: See above findings.  Upper Extremity Functional Index Score:  /80   PT Education - 09/14/20 1732     Education Details educated to keep using her shoulder in available ranges; also to do scapular retractions and shrugs; use of towel roll under shoulder for sleep    Person(s)  Educated Patient    Methods Explanation;Demonstration    Comprehension Verbalized understanding              PT Short Term Goals - 09/14/20 1750       PT SHORT TERM GOAL #1   Title Pt will be independent and compliant with initial HEP.    Baseline no program    Time 3    Period Weeks    Status New    Target Date 10/04/20      PT SHORT TERM GOAL #2   Title Pt will decrease pain by 25%  in right shoulder with ADLS    Baseline [id number] pain or higher    Time 3    Period Weeks    Status New      PT SHORT TERM GOAL #3   Title -               PT Long Term Goals - 09/14/20 1754       PT LONG TERM GOAL #1   Title She will be indpendent with advanced HEP issued    Baseline no HEP    Time 8    Period Weeks    Status New    Target Date 11/19/20      PT LONG TERM GOAL #2   Title Decreased pain in right shoulder with ADLS by >= [id number]    Baseline 8/10 or higher    Time 8    Period Weeks    Status New      PT LONG TERM GOAL #3   Title improved right shoulder flexion and ABD to 155 deg or better to normalize ADLS overhead    Baseline Rt shoulder flex 125 deg and ABD 85 deg    Time 8    Period Weeks    Status New      PT LONG TERM GOAL #4   Title Improved right shoulder ER to >= 50 degrees to aide with dressing.    Baseline 15 deg    Time 8    Period Weeks    Status New      PT LONG TERM GOAL #5   Title Patient able to sleep at least 5 hours without waking from shoulder pain.    Baseline wakes throughout the night due to pain    Time 8    Period Weeks    Status New      Additional Long Term Goals   Additional Long Term Goals Yes      PT LONG TERM GOAL #6   Title Improved right shoulder strength to 4/5 or greater to help with ADLS including carrying and lifting.    Baseline right shoulder strength 3-/5 to 4-/5    Time 8    Period Weeks    Status New                    Plan - 09/14/20 1451     Clinical Impression Statement Patient presents  with c/o of chronic right shoulder pain which has worsened since July 2022. MRI shows a SLAP tear and tendonoses. She has marked limitations in right shoulder ROM and strength deficits affecting ADLS. She  is unable to hold her arm OH and cannot reach behind her back fully. She has marked pain with movement in all planes and feels the shoulder is going to "pop out" when she extends it. She reports she still does what she can but is limited with dressing, doing her hair and lifting/carrying due to pain. She also has pain with sleeping. Patient also demonstrates decreased cervical ROM which may be in part to tightness in the left UQ. Patient will benefit from PT to address these deficits.    Personal Factors and Comorbidities Comorbidity 3+    Comorbidities fibromayalgia, DM, spinal stenosis (lumbar and cervical), bil knee pain, anemia, obesity    Examination-Activity Limitations Carry;Dressing;Hygiene/Grooming;Reach Overhead;Sleep    Stability/Clinical Decision Making Evolving/Moderate complexity    Clinical Decision Making Low    Rehab Potential Good    PT Frequency 2x / week    PT Duration 8 weeks    PT Treatment/Interventions ADLs/Self Care Home Management;Aquatic Therapy;Cryotherapy;Electrical Stimulation;Iontophoresis '4mg'$ /ml Dexamethasone;Moist Heat;Therapeutic exercise;Therapeutic activities;Patient/family education;Manual techniques;Dry needling;Taping    PT Next Visit Plan Work on ROM and strengthening; start with isometrics and progress as tolerated             Patient will benefit from skilled therapeutic intervention in order to improve the following deficits and impairments:  Decreased range of motion, Obesity, Impaired UE functional use, Increased muscle spasms, Decreased activity tolerance, Pain, Impaired flexibility, Postural dysfunction, Decreased strength  Visit Diagnosis: Chronic right shoulder pain - Plan: PT plan of care cert/re-cert  Stiffness of right shoulder, not  elsewhere classified - Plan: PT plan of care cert/re-cert  Muscle weakness (generalized) - Plan: PT plan of care cert/re-cert  Cramp and spasm - Plan: PT plan of care cert/re-cert     Problem List Patient Active Problem List   Diagnosis Date Noted   Cough 07/07/2020   Atypical chest pain 06/10/2020   Fibromyalgia 02/24/2020   Hypertension associated with type 2 diabetes mellitus ([place]) 11/10/2019   Hyperlipidemia associated with type 2 diabetes mellitus ([place]) 11/10/2019   Diabetes mellitus ([place]) 10/23/2019   Hypertension associated with diabetes ([place]) 10/23/2019   Mixed diabetic hyperlipidemia associated with type 2 diabetes mellitus ([place]) 10/23/2019   Vitamin D deficiency 10/23/2019   B12 deficiency 10/23/2019   Absolute anemia 10/23/2019   BMI 50.0-59.9, adult ([place]) 10/23/2019   Class 3 severe obesity with serious comorbidity and body mass index (BMI) of 50.0 to 59.9 in adult ([place]) 06/16/2015   Abdominal wall mass of suprapubic region 06/16/2015    Madelyn Flavors, PT 09/14/2020, 6:16 PM  [facility] [address] [place], [place], [address] Phone: [phone]   Fax:  [phone]  Name: Jennifer Dickson MRN: [id number] Date of Birth: 1975/01/21

## 2020-09-20 ENCOUNTER — Other Ambulatory Visit: Payer: Self-pay

## 2020-09-20 ENCOUNTER — Encounter: Payer: Self-pay | Admitting: Physical Therapy

## 2020-09-20 ENCOUNTER — Ambulatory Visit: Payer: Medicaid Other | Admitting: Physical Therapy

## 2020-09-20 DIAGNOSIS — R252 Cramp and spasm: Secondary | ICD-10-CM

## 2020-09-20 DIAGNOSIS — M25611 Stiffness of right shoulder, not elsewhere classified: Secondary | ICD-10-CM

## 2020-09-20 DIAGNOSIS — G8929 Other chronic pain: Secondary | ICD-10-CM

## 2020-09-20 DIAGNOSIS — M6281 Muscle weakness (generalized): Secondary | ICD-10-CM

## 2020-09-20 DIAGNOSIS — R262 Difficulty in walking, not elsewhere classified: Secondary | ICD-10-CM

## 2020-09-20 DIAGNOSIS — M25511 Pain in right shoulder: Secondary | ICD-10-CM | POA: Diagnosis not present

## 2020-09-20 NOTE — Therapy (Signed)
[facility] [address] [place], [place], [place], [address] Phone: [phone]   Fax:  [phone]  Physical Therapy Treatment  Patient Details  Name: Jennifer Dickson MRN: [id number] Date of Birth: Nov 22, 1975 Referring Provider (PT): Fredonia Highland MD   Encounter Date: 09/20/2020   PT End of Session - 09/20/20 1110     Visit Number 2    Number of Visits 5    Date for PT Re-Evaluation 10/04/20    Authorization Type MCD [place] Access    Authorization Time Period 09/20/20-10/05/20 (then need to request 2x/wk until 11/19/20)    Authorization - Visit Number 2    Authorization - Number of Visits 4    PT Start Time 1109   Pt had hard time finding parking lot   PT Stop Time 1147    PT Time Calculation (min) 38 min    Activity Tolerance Patient tolerated treatment well;Patient limited by pain    Behavior During Therapy [facility] for tasks assessed/performed             Past Medical History:  Diagnosis Date   Anemia    Anxiety    Back pain    Carpal tunnel syndrome    Cervical radiculopathy    Cervical radiculopathy    Chest pain    Chronic fatigue syndrome    Constipation    COVID-19    Depression    Diabetes mellitus without complication ([name])    Diverticulosis    DJD (degenerative joint disease)    DVT (deep vein thrombosis) in pregnancy    DVT (deep venous thrombosis) ([name])    Endometrioma    Endometriosis    Fatty liver    Fibromyalgia    Fibromyalgia    Gallbladder problem    Heartburn    High blood pressure    High cholesterol    Hypertension    IBS (irritable bowel syndrome)    Interstitial cystitis    Joint pain    Knee dislocation    Mini stroke ([name])    Osteoarthritis    Osteoarthritis    Palpitations    Pinched nerve    Sleep apnea    SOB (shortness of breath) on exertion    Swallowing difficulty    Swelling of both lower extremities    Tendonitis    Varicose veins of both legs with edema     Vitamin D deficiency     Past Surgical History:  Procedure Laterality Date   [place]    There were no vitals filed for this visit.   Subjective Assessment - 09/20/20 1112     Subjective Had a lot of swelling over the weekend at her shoulder. Pain is moderate to high, "like a toothache."    Pertinent History fibromayalgia, DM, spinal stenosis (lumbar and cervical), bil knee pain, anemia, obesity    Diagnostic tests MRI  slap tear    Currently in Pain? Yes    Pain Score 7     Pain Location Shoulder    Pain Orientation Right    Pain Descriptors / Indicators Throbbing;Constant    Aggravating Factors  ice for prolnged periods, and overuse.    Pain Relieving Factors Nothing    Multiple Pain Sites No                               [facility] Adult PT  Treatment/Exercise - 09/20/20 0001       Shoulder Exercises: Seated   Other Seated Exercises Gentel shoulder rolls 5x, added to HEp    Other Seated Exercises isometrics all planes: 5 sec hold 5x each: Emphasize gentle and pain free   Given for HEP     Manual Therapy   Other Manual Therapy L1 [place]aday to Rt upper trap, inter scapular, lat attachmentment, tricep   Pt seated                    PT Education - 09/20/20 1128     Education Details HEP    Person(s) Educated Patient    Methods Explanation;Demonstration;Verbal cues;Handout    Comprehension Returned demonstration;Verbalized understanding              PT Short Term Goals - 09/14/20 1750       PT SHORT TERM GOAL #1   Title Pt will be independent and compliant with initial HEP.    Baseline no program    Time 3    Period Weeks    Status New    Target Date 10/04/20      PT SHORT TERM GOAL #2   Title Pt will decrease pain by 25%  in right shoulder with ADLS    Baseline [id number] pain or higher    Time 3    Period Weeks    Status New      PT SHORT TERM GOAL #3   Title -               PT Long  Term Goals - 09/14/20 1754       PT LONG TERM GOAL #1   Title She will be indpendent with advanced HEP issued    Baseline no HEP    Time 8    Period Weeks    Status New    Target Date 11/19/20      PT LONG TERM GOAL #2   Title Decreased pain in right shoulder with ADLS by >= [id number]    Baseline 8/10 or higher    Time 8    Period Weeks    Status New      PT LONG TERM GOAL #3   Title improved right shoulder flexion and ABD to 155 deg or better to normalize ADLS overhead    Baseline Rt shoulder flex 125 deg and ABD 85 deg    Time 8    Period Weeks    Status New      PT LONG TERM GOAL #4   Title Improved right shoulder ER to >= 50 degrees to aide with dressing.    Baseline 15 deg    Time 8    Period Weeks    Status New      PT LONG TERM GOAL #5   Title Patient able to sleep at least 5 hours without waking from shoulder pain.    Baseline wakes throughout the night due to pain    Time 8    Period Weeks    Status New      Additional Long Term Goals   Additional Long Term Goals Yes      PT LONG TERM GOAL #6   Title Improved right shoulder strength to 4/5 or greater to help with ADLS including carrying and lifting.    Baseline right shoulder strength 3-/5 to 4-/5    Time 8    Period Weeks    Status New  Plan - 09/20/20 1153     Clinical Impression Statement Pt arrives with complaints of a constant "deep toothache" in her Rt shoulder. Pt tolerated isometrics fair as long as pt performed with gentle pressure. They were given for HEP but will need to be reviewed to make sure they are not aggrevating the shoulder.    Personal Factors and Comorbidities Comorbidity 3+    Comorbidities fibromayalgia, DM, spinal stenosis (lumbar and cervical), bil knee pain, anemia, obesity    Examination-Activity Limitations Carry;Dressing;Hygiene/Grooming;Reach Overhead;Sleep    Stability/Clinical Decision Making Evolving/Moderate complexity    Rehab Potential Good     PT Frequency 2x / week    PT Duration 8 weeks    PT Treatment/Interventions ADLs/Self Care Home Management;Aquatic Therapy;Cryotherapy;Electrical Stimulation;Iontophoresis '4mg'$ /ml Dexamethasone;Moist Heat;Therapeutic exercise;Therapeutic activities;Patient/family education;Manual techniques;Dry needling;Taping    PT Next Visit Plan Review isometrics    PT Home Exercise Plan Access Code: [facility]    Consulted and Agree with Plan of Care Patient             Patient will benefit from skilled therapeutic intervention in order to improve the following deficits and impairments:  Decreased range of motion, Obesity, Impaired UE functional use, Increased muscle spasms, Decreased activity tolerance, Pain, Impaired flexibility, Postural dysfunction, Decreased strength  Visit Diagnosis: Chronic right shoulder pain  Stiffness of right shoulder, not elsewhere classified  Muscle weakness (generalized)  Cramp and spasm  Difficulty in walking, not elsewhere classified     Problem List Patient Active Problem List   Diagnosis Date Noted   Cough 07/07/2020   Atypical chest pain 06/10/2020   Fibromyalgia 02/24/2020   Hypertension associated with type 2 diabetes mellitus ([place]) 11/10/2019   Hyperlipidemia associated with type 2 diabetes mellitus ([place]) 11/10/2019   Diabetes mellitus ([place]) 10/23/2019   Hypertension associated with diabetes ([place]) 10/23/2019   Mixed diabetic hyperlipidemia associated with type 2 diabetes mellitus ([place]) 10/23/2019   Vitamin D deficiency 10/23/2019   B12 deficiency 10/23/2019   Absolute anemia 10/23/2019   BMI 50.0-59.9, adult ([place]) 10/23/2019   Class 3 severe obesity with serious comorbidity and body mass index (BMI) of 50.0 to 59.9 in adult ([place]) 06/16/2015   Abdominal wall mass of suprapubic region 06/16/2015    Jasan Doughtie, PTA 09/20/2020, 12:17 PM  [facility] [address], [place],  [place], [address] Phone: [phone]   Fax:  [phone]  Name: Jennifer Dickson MRN: [id number] Date of Birth: [birth year]

## 2020-09-27 ENCOUNTER — Encounter: Payer: Self-pay | Admitting: Physical Therapy

## 2020-09-27 ENCOUNTER — Other Ambulatory Visit: Payer: Self-pay

## 2020-09-27 ENCOUNTER — Ambulatory Visit: Payer: Medicaid Other | Admitting: Physical Therapy

## 2020-09-27 DIAGNOSIS — M25511 Pain in right shoulder: Secondary | ICD-10-CM

## 2020-09-27 DIAGNOSIS — G8929 Other chronic pain: Secondary | ICD-10-CM

## 2020-09-27 DIAGNOSIS — M6281 Muscle weakness (generalized): Secondary | ICD-10-CM

## 2020-09-27 DIAGNOSIS — R262 Difficulty in walking, not elsewhere classified: Secondary | ICD-10-CM

## 2020-09-27 DIAGNOSIS — M25611 Stiffness of right shoulder, not elsewhere classified: Secondary | ICD-10-CM

## 2020-09-27 DIAGNOSIS — R252 Cramp and spasm: Secondary | ICD-10-CM

## 2020-09-27 NOTE — Therapy (Signed)
[facility] [address] [place], [place], [place], [address] Phone: [phone]   Fax:  [phone]  Physical Therapy Treatment  Patient Details  Name: Jennifer Dickson MRN: [id number] Date of Birth: 1975/02/16 Referring Provider (PT): Fredonia Highland MD   Encounter Date: 09/27/2020   PT End of Session - 09/27/20 1148     Visit Number 3    Number of Visits 5    Date for PT Re-Evaluation 10/04/20    Authorization Type MCD [place] Access    Authorization Time Period 09/20/20-10/05/20 (then need to request 2x/wk until 11/19/20)    Authorization - Visit Number 3    Authorization - Number of Visits 4    PT Start Time 1147    PT Stop Time 1235    PT Time Calculation (min) 48 min    Activity Tolerance Patient tolerated treatment well    Behavior During Therapy [facility] for tasks assessed/performed             Past Medical History:  Diagnosis Date   Anemia    Anxiety    Back pain    Carpal tunnel syndrome    Cervical radiculopathy    Cervical radiculopathy    Chest pain    Chronic fatigue syndrome    Constipation    COVID-19    Depression    Diabetes mellitus without complication ([name])    Diverticulosis    DJD (degenerative joint disease)    DVT (deep vein thrombosis) in pregnancy    DVT (deep venous thrombosis) ([name])    Endometrioma    Endometriosis    Fatty liver    Fibromyalgia    Fibromyalgia    Gallbladder problem    Heartburn    High blood pressure    High cholesterol    Hypertension    IBS (irritable bowel syndrome)    Interstitial cystitis    Joint pain    Knee dislocation    Mini stroke ([name])    Osteoarthritis    Osteoarthritis    Palpitations    Pinched nerve    Sleep apnea    SOB (shortness of breath) on exertion    Swallowing difficulty    Swelling of both lower extremities    Tendonitis    Varicose veins of both legs with edema    Vitamin D deficiency     Past Surgical History:   Procedure Laterality Date   [place]    There were no vitals filed for this visit.   Subjective Assessment - 09/27/20 1149     Subjective I lot my papers so I didn't do much of what you wanted me to do,    Pertinent History fibromayalgia, DM, spinal stenosis (lumbar and cervical), bil knee pain, anemia, obesity    Currently in Pain? Yes    Pain Score 6     Pain Location Shoulder    Pain Orientation Right    Pain Descriptors / Indicators Dull    Aggravating Factors  Overuse    Pain Relieving Factors not sure    Multiple Pain Sites No                [facility] PT Assessment - 09/27/20 0001       AROM   Right Shoulder Flexion 90 Degrees  [place] Adult PT Treatment/Exercise - 09/27/20 0001       Neck Exercises: Seated   Cervical Rotation Both;10 reps   AROM   Lateral Flexion Both;10 reps   AROM     Shoulder Exercises: Supine   Flexion AAROM;Both   2x10  with cane, pt semi reclined     Shoulder Exercises: Seated   Flexion AAROM;Right;10 reps   UE ranger, required modifying ROM secondary "feeling like it is going to pop out."   Other Seated Exercises Gentel shoulder rolls 10x,    Other Seated Exercises isometrics all planes: 5 sec hold 5x each: Emphasize gentle and pain free   Given for HEP     Electrical Stimulation   Electrical Stimulation Location RT shoulder   Pt seated 10 min   Electrical Stimulation Action IFC    Electrical Stimulation Parameters 80-150 HZ to tolerance    Electrical Stimulation Goals Pain      Manual Therapy   Other Manual Therapy Attempted but pt could not tolerate                     PT Education - 09/27/20 1222     Education Details [place]    Person(s) Educated Patient    Methods Explanation    Comprehension Verbalized understanding              PT Short Term Goals - 09/14/20 1750       PT SHORT TERM GOAL #1   Title Pt will be independent  and compliant with initial HEP.    Baseline no program    Time 3    Period Weeks    Status New    Target Date 10/04/20      PT SHORT TERM GOAL #2   Title Pt will decrease pain by 25%  in right shoulder with ADLS    Baseline [id number] pain or higher    Time 3    Period Weeks    Status New      PT SHORT TERM GOAL #3   Title -               PT Long Term Goals - 09/14/20 1754       PT LONG TERM GOAL #1   Title She will be indpendent with advanced HEP issued    Baseline no HEP    Time 8    Period Weeks    Status New    Target Date 11/19/20      PT LONG TERM GOAL #2   Title Decreased pain in right shoulder with ADLS by >= [id number]    Baseline 8/10 or higher    Time 8    Period Weeks    Status New      PT LONG TERM GOAL #3   Title improved right shoulder flexion and ABD to 155 deg or better to normalize ADLS overhead    Baseline Rt shoulder flex 125 deg and ABD 85 deg    Time 8    Period Weeks    Status New      PT LONG TERM GOAL #4   Title Improved right shoulder ER to >= 50 degrees to aide with dressing.    Baseline 15 deg    Time 8    Period Weeks    Status New      PT LONG TERM GOAL #5   Title Patient able to sleep at least 5 hours without waking from shoulder pain.  Baseline wakes throughout the night due to pain    Time 8    Period Weeks    Status New      Additional Long Term Goals   Additional Long Term Goals Yes      PT LONG TERM GOAL #6   Title Improved right shoulder strength to 4/5 or greater to help with ADLS including carrying and lifting.    Baseline right shoulder strength 3-/5 to 4-/5    Time 8    Period Weeks    Status New                   Plan - 09/27/20 1208     Clinical Impression Statement Pt arrives with moderate pain but reports this a "good day and feels good." Pt did not tolerate much exercise or gentle soft tissue work manually or with Addaday. Pt' pain ramped up to 8-9/10, exercises were stopped and we applied IFC for  10 min and also applied first ionto patch to greatest area of complaint. Pt was educated inwear time and skin care. pt'a AROM only measured 90 degrees today.    Personal Factors and Comorbidities Comorbidity 3+    Comorbidities fibromayalgia, DM, spinal stenosis (lumbar and cervical), bil knee pain, anemia, obesity    Examination-Activity Limitations Carry;Dressing;Hygiene/Grooming;Reach Overhead;Sleep    Stability/Clinical Decision Making Evolving/Moderate complexity    Rehab Potential Good    PT Frequency 2x / week    PT Duration 8 weeks    PT Treatment/Interventions ADLs/Self Care Home Management;Aquatic Therapy;Cryotherapy;Electrical Stimulation;Iontophoresis '4mg'$ /ml Dexamethasone;Moist Heat;Therapeutic exercise;Therapeutic activities;Patient/family education;Manual techniques;Dry needling;Taping    PT Next Visit Plan Assess pain, see if ionto was helpful. continue if helpful or reassess and try new area?    PT Home Exercise Plan Access Code: [facility]    Consulted and Agree with Plan of Care Patient             Patient will benefit from skilled therapeutic intervention in order to improve the following deficits and impairments:  Decreased range of motion, Obesity, Impaired UE functional use, Increased muscle spasms, Decreased activity tolerance, Pain, Impaired flexibility, Postural dysfunction, Decreased strength  Visit Diagnosis: Chronic right shoulder pain  Stiffness of right shoulder, not elsewhere classified  Muscle weakness (generalized)  Cramp and spasm  Difficulty in walking, not elsewhere classified     Problem List Patient Active Problem List   Diagnosis Date Noted   Cough 07/07/2020   Atypical chest pain 06/10/2020   Fibromyalgia 02/24/2020   Hypertension associated with type 2 diabetes mellitus ([name]) 11/10/2019   Hyperlipidemia associated with type 2 diabetes mellitus ([place]) 11/10/2019   Diabetes mellitus ([place]) 10/23/2019   Hypertension associated with  diabetes ([place]) 10/23/2019   Mixed diabetic hyperlipidemia associated with type 2 diabetes mellitus ([place]) 10/23/2019   Vitamin D deficiency 10/23/2019   B12 deficiency 10/23/2019   Absolute anemia 10/23/2019   BMI 50.0-59.9, adult ([place]) 10/23/2019   Class 3 severe obesity with serious comorbidity and body mass index (BMI) of 50.0 to 59.9 in adult ([place]) 06/16/2015   Abdominal wall mass of suprapubic region 06/16/2015    Zalea Pete, PTA 09/27/2020, 2:39 PM  [facility] [address] [place], [place], [address] Phone: [phone]   Fax:  [phone]  Name: Jennifer Dickson MRN: [id number] Date of Birth: Sep 02, 1975

## 2020-09-27 NOTE — Patient Instructions (Signed)

## 2020-10-04 ENCOUNTER — Ambulatory Visit: Payer: Medicaid Other | Admitting: Physical Therapy

## 2020-10-06 ENCOUNTER — Ambulatory Visit (INDEPENDENT_AMBULATORY_CARE_PROVIDER_SITE_OTHER): Payer: Medicaid Other | Admitting: Adult Health

## 2020-10-06 ENCOUNTER — Encounter (INDEPENDENT_AMBULATORY_CARE_PROVIDER_SITE_OTHER): Payer: Self-pay | Admitting: Adult Health

## 2020-10-06 ENCOUNTER — Other Ambulatory Visit: Payer: Self-pay

## 2020-10-06 VITALS — BP 111/77 | HR 89 | Temp 97.9°F | Ht 67.0 in | Wt 316.0 lb

## 2020-10-06 DIAGNOSIS — M797 Fibromyalgia: Secondary | ICD-10-CM

## 2020-10-06 DIAGNOSIS — E1169 Type 2 diabetes mellitus with other specified complication: Secondary | ICD-10-CM

## 2020-10-06 DIAGNOSIS — E66813 Obesity, class 3: Secondary | ICD-10-CM

## 2020-10-06 DIAGNOSIS — Z6841 Body Mass Index (BMI) 40.0 and over, adult: Secondary | ICD-10-CM | POA: Diagnosis not present

## 2020-10-06 NOTE — Progress Notes (Signed)
Chief Complaint:   OBESITY Jennifer Dickson is here to discuss her progress with her obesity treatment plan along with follow-up of her obesity related diagnoses. Jennifer Dickson is on the Category 3 Plan and states she is following her eating plan approximately 50% of the time. Jennifer Dickson states she is walking in home, aerobics for 16-30 minutes 3-4 times per week.  Today's visit was #: 12 Starting weight: 359 lbs Starting date: 10/23/2019 Today's weight: 316 lbs Today's date: 10/06/2020 Total lbs lost to date: 43 lbs Total lbs lost since last in-office visit: 3 lbs  Interim History: Jennifer Dickson saw her PCP on 8/92/1194, and her Trulicity was increased from 0.75 mg to 1.5 mg. She has remained on the 0.75 mg (Sunday injection) due to insurance requiring PA with new dosage.  Of note:  She reports increase in stress/depression since her children were placed with her ex-husband 2-3 years ago.  She has not seen her children in over 2 years.  Last verbal communication was over 1 year ago.  She has biweekly therapy appointments with [facility] through [facility].  She denies SI/HI.  Subjective:   1. Type 2 diabetes mellitus with other specified complication, without long-term current use of insulin (Jennifer Dickson) On 08/02/2020, A1c 6.6 - at goal with elevated BG of 118 and elevated insulin of 25.9.  Fasting BG 110s.  She denies episodes of hypoglycemia.  PCP increased Trulicity from 1.74 mg to 1.5 mg on 09/20/2020.   She has remained on the 0.75 mg (Sunday injection) due to insurance requiring PA with new dosage.QD She is also on Jardiance 10 mg daily.  2. Fibromyalgia Previously managed by the pain clinic, however, stopped due to lack of transportation.   PCP now manages Lyrica 50 mg QD.  PDMP reviewed - Lyrica 50 mg, 180 capsules last refilled on 04/21/2020.   Fibromyalgia is one of the disorders that placed her on disability.  Assessment/Plan:   1. Type 2 diabetes mellitus with other specified complication, without  long-term current use of insulin (Jennifer Dickson) Call insurance.  Call PCP.  Call pharmacy- to move along PA for Trulicity dosage increase. Good blood sugar control is important to decrease the likelihood of diabetic complications such as nephropathy, neuropathy, limb loss, blindness, coronary artery disease, and death. Intensive lifestyle modification including diet, exercise and weight loss are the first line of treatment for diabetes.   2. Fibromyalgia Continue with weight loss efforts and Lyrica per PCP.  Intensive lifestyle modifications are the first line treatment for this issue. We discussed several lifestyle modifications today and she will continue to work on diet, exercise and weight loss efforts.We will continue to monitor. Orders and follow up as documented in patient record.   Counseling Try [email or web address]/, which is a series of self-care modules designed to teach patients several techniques to manage pain.   3. Obesity with current BMI 49.5  Jennifer Dickson is currently in the action stage of change. As such, her goal is to continue with weight loss efforts. She has agreed to the Category 3 Plan.   Exercise goals:  As is.  Behavioral modification strategies: increasing lean protein intake, decreasing simple carbohydrates, meal planning and cooking strategies, keeping healthy foods in the home, and planning for success.  Jennifer Dickson has agreed to follow-up with our clinic in 4 weeks. She was informed of the importance of frequent follow-up visits to maximize her success with intensive lifestyle modifications for her multiple health conditions.   Objective:   Blood pressure 111/77, pulse 89, temperature  97.9 F (36.6 C), height 5\' 7"  (1.702 m), weight (!) 316 lb (143.3 kg), SpO2 97 %. Body mass index is 49.49 kg/m.  General: Cooperative, alert, well developed, in no acute distress. HEENT: Conjunctivae and lids unremarkable. Cardiovascular: Regular rhythm.  Lungs: Normal work of  breathing. Neurologic: No focal deficits.   Lab Results  Component Value Date   CREATININE 0.76 08/02/2020   BUN 14 08/02/2020   NA 140 08/02/2020   K 3.9 08/02/2020   CL 97 08/02/2020   CO2 29 08/02/2020   Lab Results  Component Value Date   ALT 24 08/02/2020   AST 18 08/02/2020   ALKPHOS 157 (H) 08/02/2020   BILITOT 0.5 08/02/2020   Lab Results  Component Value Date   HGBA1C 6.6 (H) 08/02/2020   Lab Results  Component Value Date   INSULIN 25.9 (H) 08/02/2020   INSULIN 45.1 (H) 10/23/2019   Lab Results  Component Value Date   TSH 1.710 10/23/2019   Lab Results  Component Value Date   VD25OH 61.0 08/02/2020   VD25OH 28.3 (L) 10/23/2019   Lab Results  Component Value Date   WBC 13.7 (H) 04/23/2020   HGB 12.9 04/23/2020   HCT 41.9 04/23/2020   MCV 82.8 04/23/2020   PLT 465 (H) 04/23/2020   Attestation Statements:   Reviewed by clinician on day of visit: allergies, medications, problem list, medical history, surgical history, family history, social history, and previous encounter notes.  Time spent on visit including pre-visit chart review and post-visit care and charting was 28 minutes.   I, Water quality scientist, CMA, am acting as Location manager for Jennifer Marble, NP.  I have reviewed the above documentation for accuracy and completeness, and I agree with the above. -  Jennifer Bee d. Jeanni Allshouse, NP-C

## 2020-10-12 ENCOUNTER — Encounter: Payer: Self-pay | Admitting: Physical Therapy

## 2020-10-12 ENCOUNTER — Ambulatory Visit: Payer: Medicaid Other | Attending: Orthopedic Surgery | Admitting: Physical Therapy

## 2020-10-12 ENCOUNTER — Other Ambulatory Visit: Payer: Self-pay

## 2020-10-12 DIAGNOSIS — R252 Cramp and spasm: Secondary | ICD-10-CM | POA: Diagnosis present

## 2020-10-12 DIAGNOSIS — G8929 Other chronic pain: Secondary | ICD-10-CM | POA: Insufficient documentation

## 2020-10-12 DIAGNOSIS — M25611 Stiffness of right shoulder, not elsewhere classified: Secondary | ICD-10-CM | POA: Diagnosis present

## 2020-10-12 DIAGNOSIS — M6281 Muscle weakness (generalized): Secondary | ICD-10-CM | POA: Diagnosis present

## 2020-10-12 DIAGNOSIS — M25511 Pain in right shoulder: Secondary | ICD-10-CM | POA: Insufficient documentation

## 2020-10-12 NOTE — Therapy (Signed)
[facility] @ [place], [place], [address] Phone:     Fax:     Physical Therapy Treatment  Patient Details  Name: Jennifer Dickson MRN: [id number] Date of Birth: 1975/07/15 Referring Provider (PT): Fredonia Highland MD   Encounter Date: 10/12/2020   PT End of Session - 10/12/20 1230     Visit Number 4    Number of Visits 15    Date for PT Re-Evaluation 11/23/20    Authorization Type MCD [place] Time Period approved 12 visits 10/12/20-11/22/20    Authorization - Visit Number 4    Authorization - Number of Visits 15    PT Start Time 1147    PT Stop Time 1228    PT Time Calculation (min) 41 min    Activity Tolerance Patient limited by pain    Behavior During Therapy [facility] for tasks assessed/performed             Past Medical History:  Diagnosis Date   Anemia    Anxiety    Back pain    Carpal tunnel syndrome    Cervical radiculopathy    Cervical radiculopathy    Chest pain    Chronic fatigue syndrome    Constipation    COVID-19    Depression    Diabetes mellitus without complication ([name])    Diverticulosis    DJD (degenerative joint disease)    DVT (deep vein thrombosis) in pregnancy    DVT (deep venous thrombosis) ([name])    Endometrioma    Endometriosis    Fatty liver    Fibromyalgia    Fibromyalgia    Gallbladder problem    Heartburn    High blood pressure    High cholesterol    Hypertension    IBS (irritable bowel syndrome)    Interstitial cystitis    Joint pain    Knee dislocation    Mini stroke    Osteoarthritis    Osteoarthritis    Palpitations    Pinched nerve    Sleep apnea    SOB (shortness of breath) on exertion    Swallowing difficulty    Swelling of both lower extremities    Tendonitis    Varicose veins of both legs with edema    Vitamin D deficiency     Past Surgical History:  Procedure Laterality Date   [place]    There were no vitals filed for this visit.   Subjective Assessment - 10/12/20 1149     Subjective The HEP irritates the shoulder.  I am not sure the patch helped me last time.    Pertinent History fibromayalgia, DM, spinal stenosis (lumbar and cervical), bil knee pain, anemia, obesity    Limitations House hold activities;Other (comment)    Diagnostic tests MRI  slap tear    Patient Stated Goals to decrease pain and have surgery to fix it    Currently in Pain? Yes    Pain Score 8     Pain Location Shoulder    Pain Orientation Right    Pain Descriptors / Indicators Aching;Dull    Pain Type Chronic pain    Pain Onset More than a month ago    Pain Frequency Constant    Aggravating Factors  using it                [facility] PT Assessment - 10/12/20  0001       Assessment   Medical Diagnosis Rt shoulder SLAP tear/tendonosis Non-op    Referring Provider (PT) Fredonia Highland MD    Onset Date/Surgical Date 07/09/20    Hand Dominance Right    [facility]      Prior Function   Level of Independence Independent with community mobility with device    Vocation On disability      ROM / Strength   AROM / PROM / Strength AROM;Strength      AROM   AROM Assessment Site Shoulder    Right/Left Shoulder Right    Right Shoulder Flexion 95 Degrees      PROM   Right Shoulder Flexion 95 Degrees                           [facility] Adult PT Treatment/Exercise - 10/12/20 0001       Exercises   Exercises Shoulder      Shoulder Exercises: Supine   Flexion AAROM;Both;10 reps    Flexion Limitations with cane as dowel to 90 deg, PT guided GH joint      Shoulder Exercises: Sidelying   Other Sidelying Exercises in Lt SL: Rt scapular A/ROM 12:00-6:00, 8:00 to 2:00 PT guided AA/ROM 1x10 then A/ROM 1x10      Shoulder Exercises: Isometric Strengthening   Extension 5X5"    External Rotation 5X5"    Internal Rotation 5X5"    Other Isometric Exercises  seated and standing: reivew for HEP but with improved cue for neutral GH joint vs rounded shoulder      Modalities   Modalities Cryotherapy      Cryotherapy   Number Minutes Cryotherapy 8 Minutes    Cryotherapy Location Shoulder   Rt   Type of Cryotherapy Ice pack      Iontophoresis   Type of Iontophoresis Dexamethasone    Location Rt posterior RC    Time 4-6 hour wear                       PT Short Term Goals - 10/12/20 1154       PT SHORT TERM GOAL #1   Title Pt will be independent and compliant with initial HEP.    Status On-going      PT SHORT TERM GOAL #2   Title Pt will decrease pain by 25%  in right shoulder with ADLS    Baseline still 8/10 with ADLs    Status On-going               PT Long Term Goals - 10/12/20 1154       PT LONG TERM GOAL #1   Title She will be indpendent with advanced HEP issued    Status On-going      PT LONG TERM GOAL #2   Title Decreased pain in right shoulder with ADLS by >= 32%    Baseline 8/10 or higher    Status On-going      PT LONG TERM GOAL #3   Title improved right shoulder flexion and ABD to 155 deg or better to normalize ADLS overhead      PT LONG TERM GOAL #4   Title Improved right shoulder ER to >= 50 degrees to aide with dressing.      PT LONG TERM GOAL #5   Title Patient able to sleep at least 5 hours without waking from  shoulder pain.    Baseline needs meds to sleep, gets 2-3 hours of sleep    Status On-going      PT LONG TERM GOAL #6   Title Improved right shoulder strength to 4/5 or greater to help with ADLS including carrying and lifting.    Baseline right shoulder strength 3-/5 to 4-/5    Status On-going                   Plan - 10/12/20 1317     Clinical Impression Statement Pt continues to experience high levels of pain in Rt shoulder, rated 8/10 or higher with attempted use.  Sleep is signif disrupted due to pain.  She was unable to tolerate many reps of AA/ROM today.  Her Rt  shoulder appears to be getting more stiff with less ROM measured today both actively and passively than at her initial visit.  PT used ice and ionto patch for pain today.  PT reviewed proper posture for scapular setting and centering of humeral head prior to performing resisted isometrics which were reviewed today.  Unable to progress beyond current HEP due to pain today.  May consider early d/c and send back to MD if Pt unable to progress and find reduced pain over next few visits.    Comorbidities fibromayalgia, DM, spinal stenosis (lumbar and cervical), bil knee pain, anemia, obesity    PT Frequency 2x / week    PT Duration 6 weeks    PT Treatment/Interventions ADLs/Self Care Home Management;Aquatic Therapy;Cryotherapy;Electrical Stimulation;Iontophoresis 4mg /ml Dexamethasone;Moist Heat;Therapeutic exercise;Therapeutic activities;Patient/family education;Manual techniques;Dry needling;Taping    PT Next Visit Plan assess pain, review scapular setting, try scapular row with yellow band, try yellow small range ER tensioner bil UEs arms at sides, ice, ionto patch, consider early d/c back to MD if Pt not making progress over next 2 weeks    PT Home Exercise Plan Access Code: [facility]    Consulted and Agree with Plan of Care Patient             Patient will benefit from skilled therapeutic intervention in order to improve the following deficits and impairments:     Visit Diagnosis: Chronic right shoulder pain - Plan: PT plan of care cert/re-cert  Stiffness of right shoulder, not elsewhere classified - Plan: PT plan of care cert/re-cert  Muscle weakness (generalized) - Plan: PT plan of care cert/re-cert     Problem List Patient Active Problem List   Diagnosis Date Noted   Cough 07/07/2020   Atypical chest pain 06/10/2020   Fibromyalgia 02/24/2020   Hypertension associated with type 2 diabetes mellitus ([place]) 11/10/2019   Hyperlipidemia associated with type 2 diabetes mellitus ([place])  11/10/2019   Diabetes mellitus ([place]) 10/23/2019   Hypertension associated with diabetes ([place]) 10/23/2019   Mixed diabetic hyperlipidemia associated with type 2 diabetes mellitus ([place]) 10/23/2019   Vitamin D deficiency 10/23/2019   B12 deficiency 10/23/2019   Absolute anemia 10/23/2019   BMI 50.0-59.9, adult ([place]) 10/23/2019   Class 3 severe obesity with serious comorbidity and body mass index (BMI) of 50.0 to 59.9 in adult ([place]) 06/16/2015   Abdominal wall mass of suprapubic region 06/16/2015   Baruch Merl, PT 10/12/20 1:26 PM   [facility] @ [place], [place], [address] Phone:     Fax:     Name: Icel Castles MRN: [id number] Date of Birth: 1975-12-25

## 2020-10-14 ENCOUNTER — Other Ambulatory Visit: Payer: Self-pay

## 2020-10-14 ENCOUNTER — Ambulatory Visit: Payer: Medicaid Other | Admitting: Physical Therapy

## 2020-10-14 ENCOUNTER — Encounter: Payer: Self-pay | Admitting: Physical Therapy

## 2020-10-14 DIAGNOSIS — R252 Cramp and spasm: Secondary | ICD-10-CM

## 2020-10-14 DIAGNOSIS — G8929 Other chronic pain: Secondary | ICD-10-CM

## 2020-10-14 DIAGNOSIS — M6281 Muscle weakness (generalized): Secondary | ICD-10-CM

## 2020-10-14 DIAGNOSIS — M25611 Stiffness of right shoulder, not elsewhere classified: Secondary | ICD-10-CM

## 2020-10-14 DIAGNOSIS — M25511 Pain in right shoulder: Secondary | ICD-10-CM | POA: Diagnosis not present

## 2020-10-14 NOTE — Therapy (Signed)
[facility] @ [place], [place], [address] Phone:     Fax:     Physical Therapy Treatment  Patient Details  Name: Jennifer Dickson MRN: [id number] Date of Birth: 29-May-1975 Referring Provider (PT): Fredonia Highland MD   Encounter Date: 10/14/2020   PT End of Session - 10/14/20 1101     Visit Number 5    Number of Visits 15    Date for PT Re-Evaluation 11/23/20    Authorization Type MCD [place] Time Period approved 12 visits 10/12/20-11/22/20    Authorization - Visit Number 5    Authorization - Number of Visits 15    PT Start Time 1101    PT Stop Time 1141    PT Time Calculation (min) 40 min    Activity Tolerance Patient tolerated treatment well;Patient limited by pain    Behavior During Therapy [facility] for tasks assessed/performed             Past Medical History:  Diagnosis Date   Anemia    Anxiety    Back pain    Carpal tunnel syndrome    Cervical radiculopathy    Cervical radiculopathy    Chest pain    Chronic fatigue syndrome    Constipation    COVID-19    Depression    Diabetes mellitus without complication ([name])    Diverticulosis    DJD (degenerative joint disease)    DVT (deep vein thrombosis) in pregnancy    DVT (deep venous thrombosis) ([name])    Endometrioma    Endometriosis    Fatty liver    Fibromyalgia    Fibromyalgia    Gallbladder problem    Heartburn    High blood pressure    High cholesterol    Hypertension    IBS (irritable bowel syndrome)    Interstitial cystitis    Joint pain    Knee dislocation    Mini stroke    Osteoarthritis    Osteoarthritis    Palpitations    Pinched nerve    Sleep apnea    SOB (shortness of breath) on exertion    Swallowing difficulty    Swelling of both lower extremities    Tendonitis    Varicose veins of both legs with edema    Vitamin D deficiency     Past Surgical History:  Procedure Laterality Date    [place]    There were no vitals filed for this visit.   Subjective Assessment - 10/14/20 1102     Subjective No change in pain with the patch. Pain not as bad today.    Pertinent History fibromayalgia, DM, spinal stenosis (lumbar and cervical), bil knee pain, anemia, obesity    Limitations House hold activities;Other (comment)    Patient Stated Goals to decrease pain and have surgery to fix it    Currently in Pain? Yes    Pain Score 6     Pain Location Shoulder    Pain Orientation Right    Pain Descriptors / Indicators Aching                               [facility] Adult PT Treatment/Exercise - 10/14/20 0001       Shoulder Exercises: Supine   Flexion --      Shoulder Exercises: Seated   Extension  10 reps    Theraband Level (Shoulder Extension) Level 1 (Yellow)    Row 10 reps    Theraband Level (Shoulder Row) Level 1 (Yellow)    Horizontal ABduction 5 reps;Both;Theraband    Theraband Level (Shoulder Horizontal ABduction) Level 1 (Yellow)    Horizontal ABduction Limitations pain in ant shouder    Diagonals Right;10 reps    Theraband Level (Shoulder Diagonals) Level 1 (Yellow)    Diagonals Limitations 2x5 done in standing    Other Seated Exercises shoulder rolls x 5      Shoulder Exercises: Standing   Other Standing Exercises leaning on blue theraball T's x 10 bil; extension x 5 stopped due to ant shoulder pain      Shoulder Exercises: ROM/Strengthening   Other ROM/Strengthening Exercises wall ladder x 5 for ABD      Shoulder Exercises: Isometric Strengthening   Extension 5X5"    External Rotation 5X5"    External Rotation Limitations cues to keep arm in neutral    Internal Rotation 5X5"    ABduction 5X5"    ABduction Limitations straight arm      Shoulder Exercises: Stretch   External Rotation Stretch 2 reps;30 seconds   elbow at 90/90 goal post in door                    PT Education - 10/14/20 1143      Education Details HEP progressed    Person(s) Educated Patient    Methods Explanation;Demonstration;Handout    Comprehension Verbalized understanding;Returned demonstration              PT Short Term Goals - 10/14/20 1210       PT SHORT TERM GOAL #1   Title Pt will be independent and compliant with initial HEP.    Status Partially Met               PT Long Term Goals - 10/12/20 1154       PT LONG TERM GOAL #1   Title She will be indpendent with advanced HEP issued    Status On-going      PT LONG TERM GOAL #2   Title Decreased pain in right shoulder with ADLS by >= 88%    Baseline 8/10 or higher    Status On-going      PT LONG TERM GOAL #3   Title improved right shoulder flexion and ABD to 155 deg or better to normalize ADLS overhead      PT LONG TERM GOAL #4   Title Improved right shoulder ER to >= 50 degrees to aide with dressing.      PT LONG TERM GOAL #5   Title Patient able to sleep at least 5 hours without waking from shoulder pain.    Baseline needs meds to sleep, gets 2-3 hours of sleep    Status On-going      PT LONG TERM GOAL #6   Title Improved right shoulder strength to 4/5 or greater to help with ADLS including carrying and lifting.    Baseline right shoulder strength 3-/5 to 4-/5    Status On-going                   Plan - 10/14/20 1152     Clinical Impression Statement Patient reporting a little less pain today to 6/10. She did not notice any difference with ionto, so we held this today. She was able to tolerate new Tband exercises fairly well and  some were issued to HEP. Importance of posture stressed throughout session. She still experiences greatest pain with ABD and ext and sometimes feels the shoulder is going to "pop out" Educated patient on importance of strengthening post shoulder and stretching out tight pectorals for both posture and shoulder rehab.    Comorbidities fibromayalgia, DM, spinal stenosis (lumbar and  cervical), bil knee pain, anemia, obesity    PT Treatment/Interventions ADLs/Self Care Home Management;Aquatic Therapy;Cryotherapy;Electrical Stimulation;Iontophoresis 30m/ml Dexamethasone;Moist Heat;Therapeutic exercise;Therapeutic activities;Patient/family education;Manual techniques;Dry needling;Taping    PT Next Visit Plan review scapular setting and new HEP, try yellow small range ER tensioner bil UEs arms at sides, ice, consider early d/c back to MD if Pt not making progress over next 2 weeks    PT Home Exercise Plan Access Code: [facility]   Consulted and Agree with Plan of Care Patient             Patient will benefit from skilled therapeutic intervention in order to improve the following deficits and impairments:  Decreased range of motion, Obesity, Impaired UE functional use, Increased muscle spasms, Decreased activity tolerance, Pain, Impaired flexibility, Postural dysfunction, Decreased strength  Visit Diagnosis: Chronic right shoulder pain  Stiffness of right shoulder, not elsewhere classified  Muscle weakness (generalized)  Cramp and spasm     Problem List Patient Active Problem List   Diagnosis Date Noted   Cough 07/07/2020   Atypical chest pain 06/10/2020   Fibromyalgia 02/24/2020   Hypertension associated with type 2 diabetes mellitus ([place] 11/10/2019   Hyperlipidemia associated with type 2 diabetes mellitus ([place] 11/10/2019   Diabetes mellitus (HB[place] 10/23/2019   Hypertension associated with diabetes (HD[place] 10/23/2019   Mixed diabetic hyperlipidemia associated with type 2 diabetes mellitus (HB[place] 10/23/2019   Vitamin D deficiency 10/23/2019   B12 deficiency 10/23/2019   Absolute anemia 10/23/2019   BMI 50.0-59.9, adult (HTres [place] 10/23/2019   Class 3 severe obesity with serious comorbidity and body mass index (BMI) of 50.0 to 59.9 in adult (HCol[place] 06/16/2015   Abdominal wall mass of suprapubic region 06/16/2015    JMadelyn FlavorsPT 10/14/2020, 12:16 PM  [email or web address] [address]Phone:     Fax:     Name: BJanna OakMRN: [id number] of Birth: [birth year]

## 2020-10-14 NOTE — Patient Instructions (Signed)
Access Code: [facility] URL: [email or web address] Date: 10/14/2020 Prepared by: Almyra Free  Exercises Seated Shoulder Rolls - 3 x daily - 7 x weekly - 1 sets - 5 reps Seated Isometric Shoulder Flexion - 1 x daily - 7 x weekly - 1 sets - 5 reps - 5 hold Seated Isometric Shoulder Extension at Counter - 1 x daily - 7 x weekly - 1 sets - 5 reps - 5 hold Seated Isometric Shoulder External Rotation - 1 x daily - 7 x weekly - 1 sets - 5 reps Seated Isometric Shoulder Internal Rotation with Towel - 1 x daily - 7 x weekly - 1 sets - 5 reps - 5 hold Seated Cervical Rotation AROM - 2 x daily - 7 x weekly - 10 reps Seated Cervical Sidebending AROM - 2 x daily - 7 x weekly - 10 reps Isometric Shoulder Abduction with Ball - Arm Straight at Wall - 1 x daily - 7 x weekly - 1 sets - 5 reps - 5 sec hold Standing Shoulder Row with Anchored Resistance - 1 x daily - 4 x weekly - 1-3 sets - 10 reps Shoulder Extension with Resistance - 1 x daily - 4 x weekly - 1-3 sets - 10 reps Standing Shoulder Single Arm PNF D2 Flexion with Anchored Resistance (Mirrored) - 1 x daily - 4 x weekly - 1-3 sets - 10 reps

## 2020-10-18 ENCOUNTER — Encounter: Payer: Medicaid Other | Admitting: Physical Therapy

## 2020-10-18 ENCOUNTER — Telehealth: Payer: Self-pay | Admitting: Physical Therapy

## 2020-10-18 NOTE — Telephone Encounter (Signed)
Called pt for missed apt. Pt reports she called to cancel appt d/t not having the money to pay her copay today.  Myrene Galas, PTA @TODAY @ 12:06 PM

## 2020-10-20 ENCOUNTER — Encounter: Payer: Medicaid Other | Admitting: Physical Therapy

## 2020-10-21 ENCOUNTER — Ambulatory Visit: Payer: Medicaid Other | Admitting: Physical Therapy

## 2020-10-21 ENCOUNTER — Other Ambulatory Visit: Payer: Self-pay

## 2020-10-21 ENCOUNTER — Encounter: Payer: Self-pay | Admitting: Physical Therapy

## 2020-10-21 DIAGNOSIS — R252 Cramp and spasm: Secondary | ICD-10-CM

## 2020-10-21 DIAGNOSIS — M25511 Pain in right shoulder: Secondary | ICD-10-CM | POA: Diagnosis not present

## 2020-10-21 DIAGNOSIS — M6281 Muscle weakness (generalized): Secondary | ICD-10-CM

## 2020-10-21 DIAGNOSIS — M25611 Stiffness of right shoulder, not elsewhere classified: Secondary | ICD-10-CM

## 2020-10-21 DIAGNOSIS — G8929 Other chronic pain: Secondary | ICD-10-CM

## 2020-10-21 NOTE — Therapy (Addendum)
[facility] @ [place], [place], [address] Phone: [phone]   Fax:  [phone]  Physical Therapy Treatment  Patient Details  Name: Jennifer Dickson MRN: [id number] Date of Birth: 1975-09-05 Referring Provider (PT): Fredonia Highland MD   Encounter Date: 10/21/2020   PT End of Session - 10/21/20 1137     Visit Number 6    Number of Visits 15    Date for PT Re-Evaluation 11/23/20    Authorization Type MCD [place] Time Period approved 12 visits 10/12/20-11/22/20    Authorization - Visit Number 6    Authorization - Number of Visits 15    PT Start Time 1100    PT Stop Time 1140    PT Time Calculation (min) 40 min    Activity Tolerance Patient tolerated treatment well;Patient limited by pain    Behavior During Therapy [facility] for tasks assessed/performed             Past Medical History:  Diagnosis Date   Anemia    Anxiety    Back pain    Carpal tunnel syndrome    Cervical radiculopathy    Cervical radiculopathy    Chest pain    Chronic fatigue syndrome    Constipation    COVID-19    Depression    Diabetes mellitus without complication ([name])    Diverticulosis    DJD (degenerative joint disease)    DVT (deep vein thrombosis) in pregnancy    DVT (deep venous thrombosis) ([name])    Endometrioma    Endometriosis    Fatty liver    Fibromyalgia    Fibromyalgia    Gallbladder problem    Heartburn    High blood pressure    High cholesterol    Hypertension    IBS (irritable bowel syndrome)    Interstitial cystitis    Joint pain    Knee dislocation    Mini stroke    Osteoarthritis    Osteoarthritis    Palpitations    Pinched nerve    Sleep apnea    SOB (shortness of breath) on exertion    Swallowing difficulty    Swelling of both lower extremities    Tendonitis    Varicose veins of both legs with edema    Vitamin D deficiency     Past Surgical History:  Procedure  Laterality Date   [place]    There were no vitals filed for this visit.   Subjective Assessment - 10/21/20 1105     Subjective My shoulder is about the same.  I am going to be moving out of the area in end of the month or Nov.    Pertinent History fibromayalgia, DM, spinal stenosis (lumbar and cervical), bil knee pain, anemia, obesity    Limitations House hold activities;Other (comment)    Diagnostic tests MRI  slap tear    Patient Stated Goals to decrease pain and have surgery to fix it    Currently in Pain? Yes    Pain Score 6     Pain Location Shoulder    Pain Orientation Right    Pain Descriptors / Indicators Aching;Sharp    Pain Type Chronic pain    Pain Onset More than a month ago    Pain Frequency Constant    Aggravating Factors  using it, moving arm behind or out to side    Pain  Relieving Factors not sure                               [facility] Adult PT Treatment/Exercise - 10/21/20 0001       Exercises   Exercises Shoulder      Shoulder Exercises: Seated   Extension 10 reps    Theraband Level (Shoulder Extension) Level 1 (Yellow)    Row 10 reps    Theraband Level (Shoulder Row) Level 1 (Yellow)    External Rotation Strengthening;Both;Theraband;20 reps    Theraband Level (Shoulder External Rotation) Level 1 (Yellow)    External Rotation Limitations tensioner within painfree range    Diagonals Right;Theraband;10 reps    Theraband Level (Shoulder Diagonals) Level 1 (Yellow)    Diagonals Limitations 2x5 Rt shoulder ext diagonal only      Shoulder Exercises: Standing   Other Standing Exercises pec doorway stretch rocking in/out x 10 reps      Shoulder Exercises: ROM/Strengthening   UBE (Upper Arm Bike) L1 3' seat 10, PT cued use Lt>Rt arm for improved tolerance with Rt arm    [place] seated, Rt flexion and scaption reaches x 15 each    Ball on Wall elbow bent, fist into ball, circles each way 2x10    Other  ROM/Strengthening Exercises wall slides Rt flexion in doorway x 10 reps, Pt reaching 120 deg flexion    Other ROM/Strengthening Exercises finger ladder x 5 each flexion (to 30) and abduction, Rt      Modalities   Modalities Cryotherapy      Cryotherapy   Number Minutes Cryotherapy 5 Minutes    Cryotherapy Location Shoulder   Rt   Type of Cryotherapy Ice pack                     PT Education - 10/21/20 1133     Education Details added seated yellow band tensioner for ER, ball on wall elbow bent circles    Person(s) Educated Patient    Methods Explanation;Demonstration;Handout    Comprehension Verbalized understanding;Returned demonstration              PT Short Term Goals - 10/14/20 1210       PT SHORT TERM GOAL #1   Title Pt will be independent and compliant with initial HEP.    Status Partially Met               PT Long Term Goals - 10/12/20 1154       PT LONG TERM GOAL #1   Title She will be indpendent with advanced HEP issued    Status On-going      PT LONG TERM GOAL #2   Title Decreased pain in right shoulder with ADLS by >= 25%    Baseline 8/10 or higher    Status On-going      PT LONG TERM GOAL #3   Title improved right shoulder flexion and ABD to 155 deg or better to normalize ADLS overhead      PT LONG TERM GOAL #4   Title Improved right shoulder ER to >= 50 degrees to aide with dressing.      PT LONG TERM GOAL #5   Title Patient able to sleep at least 5 hours without waking from shoulder pain.    Baseline needs meds to sleep, gets 2-3 hours of sleep    Status On-going      PT LONG TERM  GOAL #6   Title Improved right shoulder strength to 4/5 or greater to help with ADLS including carrying and lifting.    Baseline right shoulder strength 3-/5 to 4-/5    Status On-going                   Plan - 10/21/20 1139     Clinical Impression Statement Pt states pain is about the same.  She was able to progress participation  within session today adding UBE, ball on the wall, yellow band seated ER tensioner, and doorway wall slides to 120 deg today.  She has signif pain with extension beyond neutral and abduction attempts feeling the shoulder will pop out.  A/ROM continues to be limited but AA/ROM was more encouraging today reaching 120 deg.  Pt reports she is likely relocating out of the area later this month or next and will keep [place] posted.    Comorbidities fibromayalgia, DM, spinal stenosis (lumbar and cervical), bil knee pain, anemia, obesity    Rehab Potential Good    PT Frequency 2x / week    PT Duration 6 weeks    PT Treatment/Interventions ADLs/Self Care Home Management;Aquatic Therapy;Cryotherapy;Electrical Stimulation;Iontophoresis 29m/ml Dexamethasone;Moist Heat;Therapeutic exercise;Therapeutic activities;Patient/family education;Manual techniques;Dry needling;Taping    PT Next Visit Plan continue UBE, ball on wall, review ER tensioner, AA/ROM progression to A/ROM as tol    PT Home Exercise Plan [facility]   Consulted and Agree with Plan of Care Patient             Patient will benefit from skilled therapeutic intervention in order to improve the following deficits and impairments:  Decreased range of motion, Obesity, Impaired UE functional use, Increased muscle spasms, Decreased activity tolerance, Pain, Impaired flexibility, Postural dysfunction, Decreased strength  Visit Diagnosis: Chronic right shoulder pain  Stiffness of right shoulder, not elsewhere classified  Muscle weakness (generalized)  Cramp and spasm     Problem List Patient Active Problem List   Diagnosis Date Noted   Cough 07/07/2020   Atypical chest pain 06/10/2020   Fibromyalgia 02/24/2020   Hypertension associated with type 2 diabetes mellitus (HMil[place] 11/10/2019   Hyperlipidemia associated with type 2 diabetes mellitus (HBe[place] 11/10/2019   Diabetes mellitus (HEld[place] 10/23/2019   Hypertension associated with diabetes  (HCarm[place] 10/23/2019   Mixed diabetic hyperlipidemia associated with type 2 diabetes mellitus (HOld [place] 10/23/2019   Vitamin D deficiency 10/23/2019   B12 deficiency 10/23/2019   Absolute anemia 10/23/2019   BMI 50.0-59.9, adult (HHaworth 10/23/2019   Class 3 severe obesity with serious comorbidity and body mass index (BMI) of 50.0 to 59.9 in adult (H[place] 06/16/2015   Abdominal wall mass of suprapubic region 06/16/2015    Johanna Beuhring, PT 10/21/20 11:45 AM  PHYSICAL THERAPY DISCHARGE SUMMARY  Visits from Start of Care: 6  Current functional level related to goals / functional outcomes: Pt has new PT Rx for knee pain.  She requested d/c for Rt shoulder since she has made some improvements and has HEP.  Next visit will be eval for knee pain.    Remaining deficits: Ongoing weakness, pain, stability with limited A/ROM of Rt shoulder limiting ADLs, lifting, reaching and sleep.   Education / Equipment: HEP  Patient agrees to discharge. Patient goals were partially met. Patient is being discharged due to the patient's request.   JBaruch Merl PT 10/24/20 6:17 AM   [email or web address] [address]Phone: [phone]  Fax:  [phone]  Name: Mickenzie Stolar MRN: [id number] Date of Birth: 03-Feb-1975

## 2020-10-21 NOTE — Patient Instructions (Signed)
Access Code: [facility] URL: [email or web address] Date: 10/21/2020 Prepared by: Venetia Night Tashay Bozich  Exercises Seated Shoulder Rolls - 3 x daily - 7 x weekly - 1 sets - 5 reps Seated Isometric Shoulder Flexion - 1 x daily - 7 x weekly - 1 sets - 5 reps - 5 hold Seated Isometric Shoulder Extension at Counter - 1 x daily - 7 x weekly - 1 sets - 5 reps - 5 hold Seated Isometric Shoulder External Rotation - 1 x daily - 7 x weekly - 1 sets - 5 reps Seated Isometric Shoulder Internal Rotation with Towel - 1 x daily - 7 x weekly - 1 sets - 5 reps - 5 hold Seated Cervical Rotation AROM - 2 x daily - 7 x weekly - 10 reps Seated Cervical Sidebending AROM - 2 x daily - 7 x weekly - 10 reps Isometric Shoulder Abduction with Ball - Arm Straight at Wall - 1 x daily - 7 x weekly - 1 sets - 5 reps - 5 sec hold Standing Shoulder Row with Anchored Resistance - 1 x daily - 4 x weekly - 1-3 sets - 10 reps Shoulder Extension with Resistance - 1 x daily - 4 x weekly - 1-3 sets - 10 reps Standing Shoulder Single Arm PNF D2 Flexion with Anchored Resistance (Mirrored) - 1 x daily - 4 x weekly - 1-3 sets - 10 reps Standing Wall Ball Circles with Mini Swiss Ball - 1 x daily - 7 x weekly - 2 sets - 10 reps Shoulder External Rotation and Scapular Retraction with Resistance - 1 x daily - 7 x weekly - 2 sets - 10 reps - 5 hold

## 2020-10-25 ENCOUNTER — Encounter: Payer: Medicaid Other | Admitting: Physical Therapy

## 2020-10-26 ENCOUNTER — Ambulatory Visit: Payer: Medicaid Other | Attending: Orthopedic Surgery | Admitting: Physical Therapy

## 2020-10-26 ENCOUNTER — Other Ambulatory Visit: Payer: Self-pay

## 2020-10-26 ENCOUNTER — Encounter: Payer: Self-pay | Admitting: Physical Therapy

## 2020-10-26 DIAGNOSIS — M222X2 Patellofemoral disorders, left knee: Secondary | ICD-10-CM | POA: Insufficient documentation

## 2020-10-26 DIAGNOSIS — G8929 Other chronic pain: Secondary | ICD-10-CM

## 2020-10-26 DIAGNOSIS — M25562 Pain in left knee: Secondary | ICD-10-CM | POA: Insufficient documentation

## 2020-10-26 DIAGNOSIS — R293 Abnormal posture: Secondary | ICD-10-CM

## 2020-10-26 DIAGNOSIS — M6281 Muscle weakness (generalized): Secondary | ICD-10-CM

## 2020-10-26 NOTE — Therapy (Signed)
[facility] @ [place], [place], [address] Phone: [phone]   Fax:  [phone]  Physical Therapy Evaluation  Patient Details  Name: Jennifer Dickson MRN: [id number] Date of Birth: 09-Mar-1975 Referring Provider (PT): Edmonia Lynch, MD   Encounter Date: 10/26/2020   PT End of Session - 10/26/20 1312     Visit Number 1    Number of Visits 4    Date for PT Re-Evaluation 12/21/20    Authorization Type MCD [place] Access    Authorization Time Period requesting 1x/week x 3 weeks for initial auth    Authorization - Visit Number 7    Authorization - Number of Visits 22    PT Start Time 1232    PT Stop Time 1305    PT Time Calculation (min) 33 min    Activity Tolerance Patient limited by pain    Behavior During Therapy [facility] for tasks assessed/performed             Past Medical History:  Diagnosis Date   Anemia    Anxiety    Back pain    Carpal tunnel syndrome    Cervical radiculopathy    Cervical radiculopathy    Chest pain    Chronic fatigue syndrome    Constipation    COVID-19    Depression    Diabetes mellitus without complication ([name])    Diverticulosis    DJD (degenerative joint disease)    DVT (deep vein thrombosis) in pregnancy    DVT (deep venous thrombosis) ([name])    Endometrioma    Endometriosis    Fatty liver    Fibromyalgia    Fibromyalgia    Gallbladder problem    Heartburn    High blood pressure    High cholesterol    Hypertension    IBS (irritable bowel syndrome)    Interstitial cystitis    Joint pain    Knee dislocation    Mini stroke    Osteoarthritis    Osteoarthritis    Palpitations    Pinched nerve    Sleep apnea    SOB (shortness of breath) on exertion    Swallowing difficulty    Swelling of both lower extremities    Tendonitis    Varicose veins of both legs with edema    Vitamin D deficiency     Past Surgical History:  Procedure Laterality Date    [place]    There were no vitals filed for this visit.    Subjective Assessment - 10/26/20 1236     Subjective Pt referred to OPPT for Lt knee pain which started last year when she dislocated her kneecap while sitting in electric chair and reaching into deep freezer.  She was able to relocate the kneecap.  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  Pain is worse with standing, walking.  Pt uses SPC and has brace but isn't always wearing it due to fit with clothing challenge.    Pertinent History fibromayalgia, DM, spinal stenosis (lumbar and cervical), bil knee pain, anemia, obesity    Limitations Walking;Standing;Sitting    How long can you stand comfortably? 5 min    How long can you walk comfortably? household distances and short community distance    Diagnostic tests Pt told she needs surgery for bil knees (TKA)    Patient Stated Goals be able to walk more and stand more  Currently in Pain? Yes    Pain Score 6     Pain Location Knee    Pain Orientation Left;Medial;Upper;Lower;Anterior    Pain Descriptors / Indicators Dull;Aching    Pain Type Chronic pain    Pain Onset More than a month ago    Pain Frequency Constant    Aggravating Factors  walking and standing, stairs    Pain Relieving Factors stretching                [facility] PT Assessment - 10/26/20 0001       Assessment   Medical Diagnosis Lt knee pain    Referring Provider (PT) Edmonia Lynch, MD    Onset Date/Surgical Date --   last year   Hand Dominance Left      Precautions   Precaution Comments recurring Lt patellar dislocations      Balance Screen   Has the patient fallen in the past 6 months No      [place] residence    Living Arrangements Alone    Type of [place] to enter    Entrance Stairs-Number of Steps 3    Entrance Stairs-Rails Can reach both    Home Layout One  level    [place] - single point;Wheelchair - power;Walker - 4 wheels;Shower seat      Prior Function   Level of Independence Independent with basic ADLs;Independent with household mobility with device;Independent with community mobility with device    Vocation On disability      Posture/Postural Control   Posture/Postural Control Postural limitations    Postural Limitations Increased lumbar lordosis;Weight shift right    Posture Comments overpronation/flat footed Lt>Rt      ROM / Strength   AROM / PROM / Strength AROM;PROM;Strength      AROM   AROM Assessment Site Knee    Right/Left Shoulder Right;Left    Right/Left Knee Right;Left    Right Knee Extension 0    Right Knee Flexion 114    Left Knee Extension 0    Left Knee Flexion 99      PROM   Overall PROM Comments not tested due to signif pain with A/ROM, bil hip mobiity grossly limited 50%      Strength   Overall Strength Comments bil hips 4-/5    Strength Assessment Site Knee    Right/Left Knee Right;Left    Right Knee Flexion 3+/5    Right Knee Extension 4/5    Left Knee Flexion 3/5    Left Knee Extension 3/5      Palpation   Patella mobility reduced medial glide, Lt    Palpation comment Lt: medial and lateral joint lines, patellar tendon, distal ITB, vastus lateralis, Rt knee med/lat joint lines tender      Special Tests    Special Tests Knee Special Tests    Knee Special tests  Patellofemoral Apprehension Test      Patellofemoral Apprehension Test    Findings Positive    Side  Left      Transfers   Transfers Independent with all Transfers      Ambulation/Gait   Ambulation/Gait Yes    Ambulation/Gait Assistance 6: Modified independent (Device/Increase time)    Assistive device Straight cane    Gait Pattern Step-to pattern;Decreased stance time - left;Decreased weight shift to left;Right foot flat;Left foot flat;Antalgic;Abducted - left  Objective  measurements completed on examination: See above findings.                  PT Short Term Goals - 10/26/20 1326       PT SHORT TERM GOAL #1   Title Pt will be independent and compliant with initial HEP.    Time 3    Period Weeks    Status New    Target Date 11/16/20      PT SHORT TERM GOAL #2   Title Pt will use Lt knee brace consistently during activity to reduce pain and assist with knee stability for improved activity tolerance.    Baseline -    Status New    Target Date 11/16/20               PT Long Term Goals - 10/26/20 1327       PT LONG TERM GOAL #1   Title Pt will be ind with advanced HEP    Baseline no knowledge    Time 8    Period Weeks    Status New    Target Date 12/21/20      PT LONG TERM GOAL #2   Title Pt will be able to tolerate standing for light household tasks for up to 10 min    Baseline 5 min    Time 8    Period Weeks    Status New    Target Date 01/04/21      PT LONG TERM GOAL #3   Title Pt will be able to ambulate at least 200' with a single seated break as needed without exacerbation of pain using SPC.    Baseline household distances    Time 8    Period Weeks    Status New    Target Date 12/21/20      PT LONG TERM GOAL #4   Title Pt will achieve improved Lt knee strength of at least 4/5 for improved transfers and gait.    Baseline 3/5 flexion and ext of Lt knee    Time 8    Period Weeks    Status New    Target Date 12/21/20      PT LONG TERM GOAL #5   Title -    Baseline -      PT LONG TERM GOAL #6   Title -    Baseline -                    Plan - 10/26/20 1314     Clinical Impression Statement Pt is referred to OPPT for Lt knee pain related to recurring patellar dislocations.  Pt has been told she needs a Lt TKR.  Pt has pain rating 6/10 which worsens with standing > 5', walking and sitting.  She presents with limited Lt knee flexion A/ROM, signif weakness in Lt knee, gait dysfunction, + patellar  apprehension test, and diffuse tenderness along medial/lateral joint lines, patellar tendon and distal ITB/vastus lateralis.  She has signif pes planus bil Lt>Rt.  Pt reports she was given a knee brace but that it doesn't stay up on knee/slides down.  PT encouraged Pt to bring brace next visit to determine fit and explained benefits of wear and use especially as we try to strengthen her knee.  Pt will benefit from skilled PT to address pain and findings from intial evaluation to improve tolerance of basic mobility and functional use of Lt LE.    Personal  Factors and Comorbidities Comorbidity 3+;Fitness;Time since onset of injury/illness/exacerbation    Comorbidities fibromyalgia, DM, spinal stenosis (lumbar and cervical), bil knee pain, obesity    Examination-Activity Limitations Locomotion Level;Transfers;Squat;Stairs;Sit;Stand    Examination-Participation Restrictions Community Activity;Meal Prep;Cleaning;Shop;Laundry    Stability/Clinical Decision Making Stable/Uncomplicated    Clinical Decision Making Low    Rehab Potential Fair    PT Frequency 2x / week    PT Duration 8 weeks    PT Treatment/Interventions ADLs/Self Care Home Management;Aquatic Therapy;Cryotherapy;Electrical Stimulation;Moist Heat;Gait training;Stair training;Functional mobility training;Therapeutic activities;Therapeutic exercise;Neuromuscular re-education;Manual techniques;Patient/family education;Taping;Passive range of motion    PT Next Visit Plan assess fit of knee brace if Pt brings it OR tape Lt knee, initiate hip and knee strength on Lt as tol, weight shifting    PT Home Exercise Plan Access Code: [facility]    Consulted and Agree with Plan of Care Patient             Patient will benefit from skilled therapeutic intervention in order to improve the following deficits and impairments:  Difficulty walking, Obesity, Pain, Postural dysfunction, Decreased strength, Decreased mobility, Impaired flexibility, Abnormal gait,  Decreased range of motion, Cardiopulmonary status limiting activity, Decreased activity tolerance, Improper body mechanics  Visit Diagnosis: Chronic pain of left knee - Plan: PT plan of care cert/re-cert  Muscle weakness (generalized) - Plan: PT plan of care cert/re-cert  Abnormal posture - Plan: PT plan of care cert/re-cert     Problem List Patient Active Problem List   Diagnosis Date Noted   Cough 07/07/2020   Atypical chest pain 06/10/2020   Fibromyalgia 02/24/2020   Hypertension associated with type 2 diabetes mellitus ([place]) 11/10/2019   Hyperlipidemia associated with type 2 diabetes mellitus ([place]) 11/10/2019   Diabetes mellitus ([place]) 10/23/2019   Hypertension associated with diabetes ([place]) 10/23/2019   Mixed diabetic hyperlipidemia associated with type 2 diabetes mellitus ([place]) 10/23/2019   Vitamin D deficiency 10/23/2019   B12 deficiency 10/23/2019   Absolute anemia 10/23/2019   BMI 50.0-59.9, adult ([place]) 10/23/2019   Class 3 severe obesity with serious comorbidity and body mass index (BMI) of 50.0 to 59.9 in adult ([place]) 06/16/2015   Abdominal wall mass of suprapubic region 06/16/2015    Baruch Merl, PT 10/26/20 1:35 PM   [facility] @ [place], [place], [address] Phone: [phone]   Fax:  [phone]  Name: Verdene Creson MRN: [id number] Date of Birth: Jun 15, 1975

## 2020-10-27 ENCOUNTER — Encounter: Payer: Medicaid Other | Admitting: Physical Therapy

## 2020-10-28 ENCOUNTER — Encounter: Payer: Medicaid Other | Admitting: Physical Therapy

## 2020-11-01 ENCOUNTER — Encounter: Payer: Medicaid Other | Admitting: Physical Therapy

## 2020-11-02 ENCOUNTER — Other Ambulatory Visit (INDEPENDENT_AMBULATORY_CARE_PROVIDER_SITE_OTHER): Payer: Self-pay | Admitting: Adult Health

## 2020-11-02 DIAGNOSIS — E559 Vitamin D deficiency, unspecified: Secondary | ICD-10-CM

## 2020-11-02 NOTE — Telephone Encounter (Signed)
Katy ?

## 2020-11-03 ENCOUNTER — Other Ambulatory Visit: Payer: Self-pay

## 2020-11-03 ENCOUNTER — Encounter: Payer: Self-pay | Admitting: Physical Therapy

## 2020-11-03 ENCOUNTER — Ambulatory Visit: Payer: Medicaid Other | Admitting: Physical Therapy

## 2020-11-03 DIAGNOSIS — G8929 Other chronic pain: Secondary | ICD-10-CM

## 2020-11-03 DIAGNOSIS — R262 Difficulty in walking, not elsewhere classified: Secondary | ICD-10-CM

## 2020-11-03 DIAGNOSIS — M6281 Muscle weakness (generalized): Secondary | ICD-10-CM

## 2020-11-03 DIAGNOSIS — M25562 Pain in left knee: Secondary | ICD-10-CM | POA: Diagnosis not present

## 2020-11-03 NOTE — Therapy (Signed)
[facility] @ [place], [place], [address] Phone: [phone]   Fax:  [phone]  Physical Therapy Treatment  Patient Details  Name: Jennifer Dickson MRN: [id number] Date of Birth: 10-12-1975 Referring Provider (PT): Edmonia Lynch, MD   Encounter Date: 11/03/2020   PT End of Session - 11/03/20 1052     Visit Number 2    Number of Visits 4    Date for PT Re-Evaluation 12/21/20    Authorization Type MCD [place] Time Period MCD approved 3 visits 10/26-11/15    Authorization - Visit Number 8    Authorization - Number of Visits 59    PT Start Time 1053    PT Stop Time 1131    PT Time Calculation (min) 38 min    Activity Tolerance Patient limited by pain    Behavior During Therapy [facility] for tasks assessed/performed             Past Medical History:  Diagnosis Date   Anemia    Anxiety    Back pain    Carpal tunnel syndrome    Cervical radiculopathy    Cervical radiculopathy    Chest pain    Chronic fatigue syndrome    Constipation    COVID-19    Depression    Diabetes mellitus without complication ([name])    Diverticulosis    DJD (degenerative joint disease)    DVT (deep vein thrombosis) in pregnancy    DVT (deep venous thrombosis) ([name])    Endometrioma    Endometriosis    Fatty liver    Fibromyalgia    Fibromyalgia    Gallbladder problem    Heartburn    High blood pressure    High cholesterol    Hypertension    IBS (irritable bowel syndrome)    Interstitial cystitis    Joint pain    Knee dislocation    Mini stroke    Osteoarthritis    Osteoarthritis    Palpitations    Pinched nerve    Sleep apnea    SOB (shortness of breath) on exertion    Swallowing difficulty    Swelling of both lower extremities    Tendonitis    Varicose veins of both legs with edema    Vitamin D deficiency     Past Surgical History:  Procedure Laterality Date   [place]    There were no vitals filed for this visit.   Subjective Assessment - 11/03/20 1052     Subjective Pt brought her knee brace today.  Pain is 6/10 in Lt knee.    Pertinent History fibromayalgia, DM, spinal stenosis (lumbar and cervical), bil knee pain, anemia, obesity    Limitations Walking;Standing;Sitting    How long can you stand comfortably? 5 min    How long can you walk comfortably? household distances and short community distance    Diagnostic tests Pt told she needs surgery for bil knees (TKA)    Patient Stated Goals be able to walk more and stand more    Currently in Pain? Yes    Pain Score 6     Pain Location Knee    Pain Orientation Left;Medial;Upper;Lower;Anterior    Pain Descriptors / Indicators Aching;Dull    Pain Type Chronic pain    Pain Onset More than a month ago    Pain Frequency Constant    Aggravating Factors  walk, stand, stairs    Pain Relieving Factors stretching                               [facility] Adult PT Treatment/Exercise - 11/03/20 0001       Self-Care   Self-Care Other Self-Care Comments    Other Self-Care Comments  donning Lt knee brace for improved fit      Neuro Re-ed    Neuro Re-ed Details  sit to stand with VC for keeping COG between bil LEs vs Rt>Lt LE      Exercises   Exercises Knee/Hip      Knee/Hip Exercises: Aerobic   Nustep L2 x 5' with Lt knee brace on, PT present to monitor for pain      Knee/Hip Exercises: Standing   Gait Training 2 rounds of lateral weight shifts at counter x 15, PT VC and demo to activate Lt quad and glutes      Knee/Hip Exercises: Seated   Heel Slides Left;20 reps    Heel Slides Limitations slider under foot    Ball Squeeze 10x5"    Clamshell with TheraBand Red   2x10   Other Seated Knee/Hip Exercises heel/toe raises x 20    Marching 20 reps;Strengthening;Both    Marching Limitations red band loop around thighs    Sit to [employer] 3 sets                      PT Education - 11/03/20 1124     Education Details Access Code: [email or web address]    Person(s) Educated Patient    Methods Explanation;Handout;Demonstration    Comprehension Verbalized understanding              PT Short Term Goals - 11/03/20 1144       PT SHORT TERM GOAL #1   Title Pt will be independent and compliant with initial HEP.    Status On-going      PT SHORT TERM GOAL #2   Title Pt will use Lt knee brace consistently during activity to reduce pain and assist with knee stability for improved activity tolerance.    Baseline trialed use but Pt felt it increased her pain, may try KT tape instead    Status On-going               PT Long Term Goals - 10/26/20 1327       PT LONG TERM GOAL #1   Title Pt will be ind with advanced HEP    Baseline no knowledge    Time 8    Period Weeks    Status New    Target Date 12/21/20      PT LONG TERM GOAL #2   Title Pt will be able to tolerate standing for light household tasks for up to 10 min    Baseline 5 min    Time 8    Period Weeks    Status New    Target Date 01/04/21      PT LONG TERM GOAL #3   Title Pt will be able to ambulate at least 200' with a single seated break as needed without exacerbation of pain using SPC.    Baseline household distances    Time 8    Period Weeks    Status New    Target Date 12/21/20      PT LONG TERM GOAL #4   Title Pt  will achieve improved Lt knee strength of at least 4/5 for improved transfers and gait.    Baseline 3/5 flexion and ext of Lt knee    Time 8    Period Weeks    Status New    Target Date 12/21/20      PT LONG TERM GOAL #5   Title -    Baseline -      PT LONG TERM GOAL #6   Title -    Baseline -                   Plan - 11/03/20 1138     Clinical Impression Statement Pt arrives for first time follow up with 6/10 pain and brought Lt knee brace.  PT helped with donning for proper fit although brace does seem a bit too big.  She  reported discomfort within the brace mid-session so it was removed.  She has done well with KT tape in the past so will try that next session.  Pt was able to tolerate NuStep x 5' today along with seated ther ex, sit to stands and counter lateral weight shifts with Lt quad and glut activation today.  She uses Rt>Lt LE with sit to stand and tends to avoid full WB into Lt LE in standing and with gait.  She was able to demo Lt LE weight shift with quad and glut activation with UE support with cueing.  She did report increased pain to 7/10 end of session.  Pt limited with overall mobility, tolerance of ther ex especially in standing, and chronic pain in multiple body regions.    Comorbidities fibromyalgia, DM, spinal stenosis (lumbar and cervical), bil knee pain, obesity    Rehab Potential Fair    PT Frequency 2x / week    PT Duration 8 weeks    PT Treatment/Interventions ADLs/Self Care Home Management;Aquatic Therapy;Cryotherapy;Electrical Stimulation;Moist Heat;Gait training;Stair training;Functional mobility training;Therapeutic activities;Therapeutic exercise;Neuromuscular re-education;Manual techniques;Patient/family education;Taping;Passive range of motion    PT Next Visit Plan try KT tape for patellar support beginning of session, NuStep, review HEP, work on sit to stands Rt=Lt LE, add stagger weight shifting    PT Home Exercise Plan Access Code: [facility]    Consulted and Agree with Plan of Care Patient             Patient will benefit from skilled therapeutic intervention in order to improve the following deficits and impairments:     Visit Diagnosis: Chronic pain of left knee  Muscle weakness (generalized)  Difficulty in walking, not elsewhere classified     Problem List Patient Active Problem List   Diagnosis Date Noted   Cough 07/07/2020   Atypical chest pain 06/10/2020   Fibromyalgia 02/24/2020   Hypertension associated with type 2 diabetes mellitus ([place]) 11/10/2019    Hyperlipidemia associated with type 2 diabetes mellitus ([place]) 11/10/2019   Diabetes mellitus ([place]) 10/23/2019   Hypertension associated with diabetes ([place]) 10/23/2019   Mixed diabetic hyperlipidemia associated with type 2 diabetes mellitus ([place]) 10/23/2019   Vitamin D deficiency 10/23/2019   B12 deficiency 10/23/2019   Absolute anemia 10/23/2019   BMI 50.0-59.9, adult ([place]) 10/23/2019   Class 3 severe obesity with serious comorbidity and body mass index (BMI) of 50.0 to 59.9 in adult ([place]) 06/16/2015   Abdominal wall mass of suprapubic region 06/16/2015    Baruch Merl, PT 11/03/20 11:45 AM   [facility] @ [place], [place], [address]  Phone: [phone]   Fax:  [phone]  Name: Jennifer Dickson MRN: [id number] Date of Birth: November 15, 1975

## 2020-11-03 NOTE — Patient Instructions (Signed)
Access Code: [email or web address] URL: [email or web address] Date: 11/03/2020 Prepared by: Venetia Night Girtha Kilgore  Exercises Seated Hip Adduction Isometrics with Ball - 1 x daily - 7 x weekly - 1 sets - 10 reps - 5 hold Sit to Stand with Hands on Knees - 1 x daily - 7 x weekly - 2 sets - 3 reps Seated Hip Abduction with Resistance - 1 x daily - 7 x weekly - 2 sets - 10 reps Seated [place] - 1 x daily - 7 x weekly - 2 sets - 20 reps Side to Side Weight Shift with Counter Support - 2 x daily - 7 x weekly - 1 sets - 15 reps

## 2020-11-04 ENCOUNTER — Ambulatory Visit (INDEPENDENT_AMBULATORY_CARE_PROVIDER_SITE_OTHER): Payer: Medicaid Other | Admitting: Adult Health

## 2020-11-08 ENCOUNTER — Encounter: Payer: Medicaid Other | Admitting: Physical Therapy

## 2020-11-10 ENCOUNTER — Encounter: Payer: Medicaid Other | Admitting: Physical Therapy

## 2020-11-15 ENCOUNTER — Encounter: Payer: Medicaid Other | Admitting: Physical Therapy

## 2020-11-17 ENCOUNTER — Other Ambulatory Visit: Payer: Self-pay

## 2020-11-17 ENCOUNTER — Encounter: Payer: Self-pay | Admitting: Physical Therapy

## 2020-11-17 ENCOUNTER — Ambulatory Visit: Payer: Medicaid Other | Attending: Orthopedic Surgery | Admitting: Physical Therapy

## 2020-11-17 DIAGNOSIS — G8929 Other chronic pain: Secondary | ICD-10-CM | POA: Insufficient documentation

## 2020-11-17 DIAGNOSIS — M6281 Muscle weakness (generalized): Secondary | ICD-10-CM | POA: Diagnosis present

## 2020-11-17 DIAGNOSIS — R262 Difficulty in walking, not elsewhere classified: Secondary | ICD-10-CM | POA: Diagnosis present

## 2020-11-17 DIAGNOSIS — M25562 Pain in left knee: Secondary | ICD-10-CM | POA: Diagnosis present

## 2020-11-17 NOTE — Therapy (Signed)
[facility] @ [facility] [place], [place], [address] Phone: [phone]   Fax:  [phone]  Physical Therapy Treatment  Patient Details  Name: Jennifer Dickson MRN: [id number] Date of Birth: [birth year] Referring Provider (PT): Edmonia Lynch, MD   Encounter Date: 11/17/2020   PT End of Session - 11/17/20 1144     Visit Number 3    Number of Visits 4    Date for PT Re-Evaluation 12/21/20    Authorization Type [facility] [place] Access    Authorization Time Period requesting 2x/week x 6 weeks    Authorization - Visit Number 9   used PT visits for another body part earlier this year   Authorization - Number of Visits 27    PT Start Time 1100    PT Stop Time 1140    PT Time Calculation (min) 40 min    Activity Tolerance Patient limited by pain    Behavior During Therapy WFL for tasks assessed/performed             Past Medical History:  Diagnosis Date   Anemia    Anxiety    Back pain    Carpal tunnel syndrome    Cervical radiculopathy    Cervical radiculopathy    Chest pain    Chronic fatigue syndrome    Constipation    COVID-19    Depression    Diabetes mellitus without complication ([name])    Diverticulosis    DJD (degenerative joint disease)    DVT (deep vein thrombosis) in pregnancy    DVT (deep venous thrombosis) ([name])    Endometrioma    Endometriosis    Fatty liver    Fibromyalgia    Fibromyalgia    Gallbladder problem    Heartburn    High blood pressure    High cholesterol    Hypertension    IBS (irritable bowel syndrome)    Interstitial cystitis    Joint pain    Knee dislocation    Mini stroke    Osteoarthritis    Osteoarthritis    Palpitations    Pinched nerve    Sleep apnea    SOB (shortness of breath) on exertion    Swallowing difficulty    Swelling of both lower extremities    Tendonitis    Varicose veins of both legs with edema    Vitamin D deficiency     Past Surgical  History:  Procedure Laterality Date   CESAREAN SECTION     CHOLECYSTECTOMY  1996    There were no vitals filed for this visit.   Subjective Assessment - 11/17/20 1106     Subjective My knee is doing terrible.  I lost my HEP papers and haven't been able to do my exercises.    Pertinent History fibromayalgia, DM, spinal stenosis (lumbar and cervical), bil knee pain, anemia, obesity    Limitations Walking;Standing;Sitting    How long can you stand comfortably? 5 min    How long can you walk comfortably? household distances and short community distance    Diagnostic tests Pt told she needs surgery for bil knees (TKA)    Patient Stated Goals be able to walk more and stand more    Currently in Pain? Yes    Pain Score 7     Pain Location Knee    Pain Orientation Left;Medial;Upper;Lower;Anterior    Pain Descriptors / Indicators Aching;Dull    Pain Type Chronic pain    Pain Onset  More than a month ago    Pain Frequency Constant    Aggravating Factors  walk, stand, stairs, bend the knee    Pain Relieving Factors stretching    Effect of Pain on Daily Activities limited in all functional weight bearing activities                [facility] PT Assessment - 11/17/20 0001       Assessment   Medical Diagnosis Lt knee pain    Referring Provider (PT) Edmonia Lynch, MD    Onset Date/Surgical Date --   last year   Hand Dominance Left      Precautions   Precaution Comments recurring Lt patellar dislocations      Balance Screen   Has the patient fallen in the past 6 months No    Has the patient had a decrease in activity level because of a fear of falling?  No    Is the patient reluctant to leave their home because of a fear of falling?  No      Home Environment   Living Environment Private residence    Living Arrangements Alone    Type of [place] to enter    Entrance Stairs-Number of Steps 3    Entrance Stairs-Rails Can reach both    [place] One level     [place] - single point;Wheelchair - power;Walker - 4 wheels;Shower seat      Prior Function   Level of Independence Independent with basic ADLs;Independent with household mobility with device;Independent with community mobility with device;Needs assistance with ADLs;Requires assistive device for independence    Vocation On disability      Posture/Postural Control   Posture/Postural Control Postural limitations    Postural Limitations Increased lumbar lordosis;Weight shift right    Posture Comments overpronation/flat footed Lt>Rt      AROM   AROM Assessment Site Knee    Right/Left Shoulder Right;Left    Right Knee Extension 0    Right Knee Flexion 114    Left Knee Extension -4   hyperextension   Left Knee Flexion 101   Pt tearful in pain with this effort to measure max flexion     PROM   Overall PROM Comments bil hip ROM grossly limited 50%      Strength   Overall Strength Comments bil hips 4-/5    Strength Assessment Site Knee    Right/Left Knee Right;Left    Right Knee Flexion 3+/5    Right Knee Extension 4/5    Left Knee Flexion 3+/5    Left Knee Extension 3+/5      Palpation   Patella mobility reduced medial glide, Lt    Palpation comment Lt: medial and lateral joint lines, patellar tendon, distal ITB, vastus lateralis, Rt knee med/lat joint lines tender      Special Tests    Special Tests Knee Special Tests    Knee Special tests  Patellofemoral Apprehension Test      Patellofemoral Apprehension Test    Findings Positive    Side  Left      Ambulation/Gait   Gait Comments VC for closed chain glut activation on Lt in stance phase, avoid Lt knee hyperextension                           [facility] Adult PT Treatment/Exercise - 11/17/20 0001       Ambulation/Gait  Ambulation/Gait Yes    Ambulation/Gait Assistance 6: Modified independent (Device/Increase time)    Ambulation Distance (Feet) 230 Feet    Assistive device Straight cane     Gait Pattern Step-through pattern;Decreased stance time - left;Decreased weight shift to left;Antalgic      Exercises   Exercises Knee/Hip      Knee/Hip Exercises: Aerobic   Nustep L2 x 8' seat 12      Knee/Hip Exercises: Seated   Long Arc Quad AROM;Left;10 reps    Heel Slides Left;20 reps    Heel Slides Limitations slider under foot    Ball Squeeze 10x5"    Clamshell with TheraBand Red   2x10   Other Seated Knee/Hip Exercises heel/toe raises x 20    Marching 20 reps      Knee/Hip Exercises: Supine   Heel Slides Left;1 set;10 reps;AAROM      Manual Therapy   Manual Therapy Taping    Kinesiotex Facilitate Muscle      Kinesiotix   Facilitate Muscle  Lt patellar stabilization                       PT Short Term Goals - 11/17/20 1504       PT SHORT TERM GOAL #1   Title Pt will be independent and compliant with initial HEP.    Status On-going      PT SHORT TERM GOAL #2   Title Pt will use Lt knee brace consistently during activity to reduce pain and assist with knee stability for improved activity tolerance.    Baseline Pt reports she does not tolerate knee brace - feels too big    Status Deferred               PT Long Term Goals - 11/17/20 1504       PT LONG TERM GOAL #1   Title Pt will be ind with advanced HEP    Status New      PT LONG TERM GOAL #2   Title Pt will be able to tolerate standing for light household tasks for up to 10 min    Baseline 5 min    Status New      PT LONG TERM GOAL #3   Title Pt will be able to ambulate at least 300' without exacerbation of Lt knee pain using SPC.    Baseline 230', increased pain with this    Status Revised      PT LONG TERM GOAL #4   Title Pt will achieve improved Lt knee strength of at least 4/5 for improved transfers and gait.    Baseline 3+/5    Status On-going      PT LONG TERM GOAL #5   Title Pt will be able to participate fully with min need for breaks and without exacerbation of Lt knee  pain during aquatic PT to demo improved endurance and ther ex tolerance for improved strength    Time 8    Period Weeks    Status New    Target Date 01/12/21                   Plan - 11/17/20 1146     Clinical Impression Statement Pt continues to be limited with gait distance, transfers and bed mobility secondary to Lt knee pain, bil hip pain and LBP (currently in PT for Lt knee).  She rates pain today as 7-8/10 with report of recurring painful popping.  She did ambulate 58' with SPC today using antalgic step through gait pattern and limited weight shift and stance time on Lt.  She continues to use Rt>Lt LE with sit to stand.  Bed mobility is limited secondary to bil hip and lumbar pain.  She lost her HEP papers and also lacked time to do HEP since last visit due to a family emergency.  Pt does not tolerate wearing her Lt knee brace.  PT did apply KT tape today which Pt felt gave her more confidence to bear more weight with gait.  Given Pt's limited tolerance of land-based ther ex related to Lt knee, bil hip and lumbar pain, PT recommends aquatic PT 2x/week x 6 weeks to increase tolerance of gait, endurance and strength in an environment that will be more successful in unweighting joints.  PT encouraged Pt to bring walker vs SPC to aquatic sessions given the distance from parking lot to pool and back.    Personal Factors and Comorbidities Comorbidity 3+;Fitness;Time since onset of injury/illness/exacerbation    Comorbidities fibromyalgia, DM, spinal stenosis (lumbar and cervical), bil knee pain, obesity    Examination-Activity Limitations Locomotion Level;Transfers;Squat;Stairs;Sit;Stand    Examination-Participation Restrictions Community Activity;Meal Prep;Cleaning;Shop;Laundry    Stability/Clinical Decision Making Stable/Uncomplicated    Clinical Decision Making Low    Rehab Potential Fair    PT Frequency 2x / week   aquatic PT 2x/week x 6 weeks   PT Duration 8 weeks    PT  Treatment/Interventions ADLs/Self Care Home Management;Aquatic Therapy;Cryotherapy;Electrical Stimulation;Moist Heat;Gait training;Stair training;Functional mobility training;Therapeutic activities;Therapeutic exercise;Neuromuscular re-education;Manual techniques;Patient/family education;Taping    PT Next Visit Plan aquatic PT to work on gait, endurance, improve strength of Lt knee and bil hips, core, weight shifting tolerance into Lt LE    PT Home Exercise Plan Access Code: [facility]    Consulted and Agree with Plan of Care Patient             Patient will benefit from skilled therapeutic intervention in order to improve the following deficits and impairments:  Difficulty walking, Obesity, Pain, Postural dysfunction, Decreased strength, Decreased mobility, Impaired flexibility, Abnormal gait, Decreased range of motion, Cardiopulmonary status limiting activity, Decreased activity tolerance, Improper body mechanics  Visit Diagnosis: Chronic pain of left knee - Plan: PT plan of care cert/re-cert  Muscle weakness (generalized) - Plan: PT plan of care cert/re-cert  Difficulty in walking, not elsewhere classified - Plan: PT plan of care cert/re-cert     Problem List Patient Active Problem List   Diagnosis Date Noted   Cough 07/07/2020   Atypical chest pain 06/10/2020   Fibromyalgia 02/24/2020   Hypertension associated with type 2 diabetes mellitus ([place]) 11/10/2019   Hyperlipidemia associated with type 2 diabetes mellitus ([place]) 11/10/2019   Diabetes mellitus ([place]) 10/23/2019   Hypertension associated with diabetes ([place]) 10/23/2019   Mixed diabetic hyperlipidemia associated with type 2 diabetes mellitus ([place]) 10/23/2019   Vitamin D deficiency 10/23/2019   B12 deficiency 10/23/2019   Absolute anemia 10/23/2019   BMI 50.0-59.9, adult ([place]) 10/23/2019   Class 3 severe obesity with serious comorbidity and body mass index (BMI) of 50.0 to 59.9 in adult ([place]) 06/16/2015   Abdominal wall mass  of suprapubic region 06/16/2015    Baruch Merl, PT 11/17/20 3:13 PM   [facility] @ [facility] [place], [place], [address] Phone: [phone]   Fax:  [phone]  Name: Jennifer Dickson MRN: [id number] Date of Birth: 1975/02/07

## 2020-11-17 NOTE — Patient Instructions (Signed)
     [facility] Welcome to [facility]! Here you will find all the information you will need regarding your pool therapy. If you have further questions at any time, please call our office at [phone]. After completing your initial evaluation in the [facility], you may be eligible to complete a portion of your therapy in the pool. A typical week of therapy will consist of 1-2 typical physical therapy visits at our [facility] location and an additional session of therapy in the pool located at the [facility] at [address]. [address], [place] [address]. The phone number at the pool site is [phone]. Please call this number if you are running late or need to cancel your appointment.  Aquatic therapy will be offered on Friday afternoons. Each session will last approximately 45 minutes. All scheduling and payments for aquatic therapy sessions, including cancelations, will be done through our [facility] location.  To be eligible for aquatic therapy, these criteria must be met: You must be able to independently change in the locker room and get to the pool deck. A caregiver can come with you to help if needed. There are bleachers for a caregiver to sit on next to the pool. No one with an open wound is permitted in the pool.  Handicap parking is available in the front and there is a drop off option for even closer accessibility. Please arrive 15 minutes prior to your appointment to prepare for your pool session. You must sign in at the front desk upon your arrival. Please be sure to attend to any toileting needs prior to entering the pool. Locker rooms for changing are located to the right of the check-in desk. There is direct access to the pool deck from the locker room. You can lock your belongings in a locker but must bring a lock. Your therapist will greet you on the pool deck. There may be other swimmers in the pool at the  same time but your session is one-on-one with the therapist.   

## 2020-11-23 ENCOUNTER — Encounter (INDEPENDENT_AMBULATORY_CARE_PROVIDER_SITE_OTHER): Payer: Self-pay | Admitting: Adult Health

## 2020-11-23 ENCOUNTER — Other Ambulatory Visit: Payer: Self-pay

## 2020-11-23 ENCOUNTER — Ambulatory Visit (INDEPENDENT_AMBULATORY_CARE_PROVIDER_SITE_OTHER): Payer: Medicaid Other | Admitting: Adult Health

## 2020-11-23 VITALS — BP 125/74 | HR 88 | Temp 97.9°F | Ht 67.0 in | Wt 321.0 lb

## 2020-11-23 DIAGNOSIS — Z6841 Body Mass Index (BMI) 40.0 and over, adult: Secondary | ICD-10-CM

## 2020-11-23 DIAGNOSIS — E559 Vitamin D deficiency, unspecified: Secondary | ICD-10-CM | POA: Diagnosis not present

## 2020-11-23 DIAGNOSIS — E1169 Type 2 diabetes mellitus with other specified complication: Secondary | ICD-10-CM

## 2020-11-23 NOTE — Progress Notes (Signed)
Chief Complaint:   OBESITY Jennifer Dickson is here to discuss her progress with her obesity treatment plan along with follow-up of her obesity related diagnoses. Jennifer Dickson is on the Category 3 Plan and states she is following her eating plan approximately 0% of the time. Jennifer Dickson states she is PT/walking for 15-30 minutes 2 times per week.  Today's visit was #: 13 Starting weight: 359 lbs Starting date: 10/23/2019 Today's weight: 321 lbs Today's date: 11/23/2020 Total lbs lost to date: 38 lbs Total lbs lost since last in-office visit: 0  Interim History:  Jennifer Dickson is not using her walker, however, she is still using a cane and wheelchair at home due frequent falls.  Last fall - 2 days ago.  She typically falls 1700-0000.  Bilateral knees will spontaneously give out, left > right.  She started outpatient PT.  However, due to stiffness and mobility limitations,Jennifer Dickson was converted to aquatic therapy - has been scheduled for November/December. Due to financial constraints, she is limited with what food she can afford.  Breakfast - sausage links, 2 eggs, yogurt. Lunch - 4 ounce chicken breast with salad. Snack - Gummy bears, Tootsie Rolls, chips, popcorn (100 calorie). Dinner - She would prefer meat protein, but due to financial constraints, she will often eat potatos and mac-n-cheese.  Her refrigerator has malfunctioned twice - she has lost 2 stores of food.  Subjective:   1. Type 2 diabetes mellitus with other specified complication, without long-term current use of insulin ([place]) On 08/02/2020, A1c 6.6 - at goal. PCP manages Trulicity 1.5 mg (increased on 09/20/2020) once weekly.  She denies mass in neck, dysphagia, dyspepsia, or persistent hoarseness. She denies GI upset.  2. Vitamin D deficiency She has been off ergocalciferol for months.  Reports increased fatigue. She would like to restart ergocalciferol.  On 08/02/2020, vitamin D level - 61.0.  Assessment/Plan:   1. Type 2 diabetes  mellitus with other specified complication, without long-term current use of insulin ([name]) Check labs today. Continue Trulicity per PCP.  - Hemoglobin A1c  2. Vitamin D deficiency Check labs today. MyChart after labs.  - VITAMIN D 25 Hydroxy (Vit-D Deficiency, Fractures)  3. Obesity with current BMI 50.3  Jennifer Dickson is currently in the action stage of change. As such, her goal is to continue with weight loss efforts. She has agreed to the Category 3 Plan.   Check fasting labs at next office visit.  Exercise goals:  As is.  Behavioral modification strategies: increasing lean protein intake, decreasing simple carbohydrates, meal planning and cooking strategies, keeping healthy foods in the home, and planning for success.  Jennifer Dickson has agreed to follow-up with our clinic in 4 weeks. She was informed of the importance of frequent follow-up visits to maximize her success with intensive lifestyle modifications for her multiple health conditions.   Objective:   Blood pressure 125/74, pulse 88, temperature 97.9 F (36.6 C), height _0  (1.702 m), weight (!) 321 lb (145.6 kg), SpO2 100 %. Body mass index is 50.28 kg/m.  General: Cooperative, alert, well developed, in no acute distress. HEENT: Conjunctivae and lids unremarkable. Cardiovascular: Regular rhythm.  Lungs: Normal work of breathing. Neurologic: No focal deficits.   Lab Results  Component Value Date   CREATININE 0.76 08/02/2020   BUN 14 08/02/2020   NA 140 08/02/2020   K 3.9 08/02/2020   CL 97 08/02/2020   CO2 29 08/02/2020   Lab Results  Component Value Date   ALT 24 08/02/2020   AST  18 08/02/2020   ALKPHOS 157 (H) 08/02/2020   BILITOT 0.5 08/02/2020   Lab Results  Component Value Date   HGBA1C 6.6 (H) 08/02/2020   Lab Results  Component Value Date   INSULIN 25.9 (H) 08/02/2020   INSULIN 45.1 (H) 10/23/2019   Lab Results  Component Value Date   TSH 1.710 10/23/2019   Lab Results  Component Value Date    VD25OH 61.0 08/02/2020   VD25OH 28.3 (L) 10/23/2019   Lab Results  Component Value Date   WBC 13.7 (H) 04/23/2020   HGB 12.9 04/23/2020   HCT 41.9 04/23/2020   MCV 82.8 04/23/2020   PLT 465 (H) 04/23/2020   Attestation Statements:   Reviewed by clinician on day of visit: allergies, medications, problem list, medical history, surgical history, family history, social history, and previous encounter notes.  Time spent on visit including pre-visit chart review and post-visit care and charting was 35 minutes.   I, Water quality scientist, CMA, am acting as Location manager for Mina Marble, NP.  I have reviewed the above documentation for accuracy and completeness, and I agree with the above. -  Davelyn Gwinn d. Adaleigh Warf, NP-C

## 2020-11-24 ENCOUNTER — Encounter (INDEPENDENT_AMBULATORY_CARE_PROVIDER_SITE_OTHER): Payer: Self-pay | Admitting: Adult Health

## 2020-11-24 ENCOUNTER — Other Ambulatory Visit (INDEPENDENT_AMBULATORY_CARE_PROVIDER_SITE_OTHER): Payer: Self-pay | Admitting: Adult Health

## 2020-11-24 LAB — VITAMIN D 25 HYDROXY (VIT D DEFICIENCY, FRACTURES): Vit D, 25-Hydroxy: 34.4 ng/mL (ref 30.0–100.0)

## 2020-11-24 LAB — HEMOGLOBIN A1C
Est. average glucose Bld gHb Est-mCnc: 128 mg/dL
Hgb A1c MFr Bld: 6.1 % — ABNORMAL HIGH (ref 4.8–5.6)

## 2020-11-24 MED ORDER — VITAMIN D (ERGOCALCIFEROL) 1.25 MG (50000 UNIT) PO CAPS: 50000.0000 [IU] | ORAL_CAPSULE | ORAL | 0 refills | Status: AC

## 2020-11-24 NOTE — Progress Notes (Signed)
x

## 2020-11-29 ENCOUNTER — Encounter (HOSPITAL_BASED_OUTPATIENT_CLINIC_OR_DEPARTMENT_OTHER): Payer: Self-pay | Admitting: Physical Therapy

## 2020-11-29 ENCOUNTER — Ambulatory Visit (HOSPITAL_BASED_OUTPATIENT_CLINIC_OR_DEPARTMENT_OTHER): Payer: Medicaid Other | Attending: Orthopedic Surgery | Admitting: Physical Therapy

## 2020-11-29 ENCOUNTER — Other Ambulatory Visit: Payer: Self-pay

## 2020-11-29 DIAGNOSIS — R262 Difficulty in walking, not elsewhere classified: Secondary | ICD-10-CM | POA: Insufficient documentation

## 2020-11-29 DIAGNOSIS — R2681 Unsteadiness on feet: Secondary | ICD-10-CM | POA: Diagnosis present

## 2020-11-29 DIAGNOSIS — M6281 Muscle weakness (generalized): Secondary | ICD-10-CM | POA: Insufficient documentation

## 2020-11-29 DIAGNOSIS — G8929 Other chronic pain: Secondary | ICD-10-CM | POA: Diagnosis present

## 2020-11-29 DIAGNOSIS — M25561 Pain in right knee: Secondary | ICD-10-CM | POA: Diagnosis present

## 2020-11-29 DIAGNOSIS — R293 Abnormal posture: Secondary | ICD-10-CM | POA: Diagnosis present

## 2020-11-29 DIAGNOSIS — R2689 Other abnormalities of gait and mobility: Secondary | ICD-10-CM | POA: Diagnosis present

## 2020-11-29 NOTE — Therapy (Signed)
[facility] [address] [place], [place], [address] Phone: [phone]   Fax:  [phone]  Physical Therapy Treatment  Patient Details  Name: Jennifer Dickson MRN: [id number] Date of Birth: [birth year] Referring Provider (PT): Edmonia Lynch, MD   Encounter Date: 11/29/2020   PT End of Session - 11/29/20 1642     Visit Number 4    Number of Visits 5    Date for PT Re-Evaluation 02/04/21    Authorization Type [facility] [place] Access    Authorization Time Period requesting 2x/week x 6 weeks    Authorization - Visit Number 9   used PT visits for another body part earlier this year   Authorization - Number of Visits 49    PT Start Time 0335    PT Stop Time 0415    PT Time Calculation (min) 40 min    Activity Tolerance Patient limited by pain    Behavior During Therapy [facility] for tasks assessed/performed             Past Medical History:  Diagnosis Date   Anemia    Anxiety    Back pain    Carpal tunnel syndrome    Cervical radiculopathy    Cervical radiculopathy    Chest pain    Chronic fatigue syndrome    Constipation    COVID-19    Depression    Diabetes mellitus without complication ([name])    Diverticulosis    DJD (degenerative joint disease)    DVT (deep vein thrombosis) in pregnancy    DVT (deep venous thrombosis) ([name])    Endometrioma    Endometriosis    Fatty liver    Fibromyalgia    Fibromyalgia    Gallbladder problem    Heartburn    High blood pressure    High cholesterol    Hypertension    IBS (irritable bowel syndrome)    Interstitial cystitis    Joint pain    Knee dislocation    Mini stroke    Osteoarthritis    Osteoarthritis    Palpitations    Pinched nerve    Sleep apnea    SOB (shortness of breath) on exertion    Swallowing difficulty    Swelling of both lower extremities    Tendonitis    Varicose veins of both legs with edema    Vitamin D deficiency     Past Surgical History:   Procedure Laterality Date   [place]    There were no vitals filed for this visit.   Subjective Assessment - 11/29/20 1556     Subjective "Im a littlenervous about getting in pool    Currently in Pain? Yes    Pain Score 6     Pain Location Knee    Pain Type Chronic pain    Pain Frequency Constant                                        PT Education - 11/29/20 1825     Education Details Properties of water, benefits of aquatic therapy    Person(s) Educated Patient    Methods Explanation    Comprehension Verbalized understanding              PT Short Term Goals - 11/17/20 1504       PT SHORT TERM GOAL #  1   Title Pt will be independent and compliant with initial HEP.    Status On-going      PT SHORT TERM GOAL #2   Title Pt will use Lt knee brace consistently during activity to reduce pain and assist with knee stability for improved activity tolerance.    Baseline Pt reports she does not tolerate knee brace - feels too big    Status Deferred               PT Long Term Goals - 11/17/20 1504       PT LONG TERM GOAL #1   Title Pt will be ind with advanced HEP    Status New      PT LONG TERM GOAL #2   Title Pt will be able to tolerate standing for light household tasks for up to 10 min    Baseline 5 min    Status New      PT LONG TERM GOAL #3   Title Pt will be able to ambulate at least 300' without exacerbation of Lt knee pain using SPC.    Baseline 230', increased pain with this    Status Revised      PT LONG TERM GOAL #4   Title Pt will achieve improved Lt knee strength of at least 4/5 for improved transfers and gait.    Baseline 3+/5    Status On-going      PT LONG TERM GOAL #5   Title Pt will be able to participate fully with min need for breaks and without exacerbation of Lt knee pain during aquatic PT to demo improved endurance and ther ex tolerance for improved strength    Time 8     Period Weeks    Status New    Target Date 01/12/21           Pt seen for aquatic therapy today.  Treatment took place in water 3.25-4.8 ft in depth at the [facility] pool. Temp of water was 91.  Pt entered/exited the pool via stairs step to pattern independently with bilat rail. Pt entered water for aquatic therapy for first time and was introduced to principles and therapeutic effects of water as she ambulated and acclimated to pool.  Amb for warm up in all depths forward, backward and side stepping holding to yellow noodle.  VC for improved gait. Core activation by holding hand buoys by sides and amb x 4 widths  Seated stretching: gastroc, hamstrings.  Manual assist for improved stretch exercises knee flex 2x10  Standing Holding to pool wall: Knee flex x10 Hip add/abd x10 Hip extension x10 Hip flex x10 Marching x10 Step ups submerged to chest R/L x10  Gait training supported by hand buoys. Vc for heel strike and toe off, increasing knee flex and step length Pt reports some tightness and slight discomfort in left knee throughout session.   Pt requires buoyancy for support and to offload joints with strengthening exercises. Viscosity of the water is needed for resistance of strengthening; water current perturbations provides challenge to standing balance unsupported, requiring increased core activation.         Plan - 11/29/20 1826     Clinical Impression Statement Pt introduced to setting. She is apprehensive initially but becomes more comfortable as session continues.  VC for left knee flex with amb in pool with encouragement for heel strike and toe off to improve knee flex. She reports popping and cracking in right knee with exercise and  tightness in left knee. Pt apprehensive about flexing left knee in standing and seated.  Pain with gentle left patellar mobs. Upon completion of session pt demonstrated improved ability/decreased guarding of L and R knee flex  with improved weight shifting and progress towards normalized gait pattern submerged.  She is an excellent cadidate for aquatic therapy to improve strength and mobility.    Clinical Decision Making Low    Rehab Potential Fair    PT Frequency 2x / week    PT Treatment/Interventions ADLs/Self Care Home Management;Aquatic Therapy;Cryotherapy;Electrical Stimulation;Moist Heat;Gait training;Stair training;Functional mobility training;Therapeutic activities;Therapeutic exercise;Neuromuscular re-education;Manual techniques;Patient/family education;Taping    PT Next Visit Plan Aquatics: proximal strengtheing    PT Home Exercise Plan Access Code: [facility]             Patient will benefit from skilled therapeutic intervention in order to improve the following deficits and impairments:  Difficulty walking, Obesity, Pain, Postural dysfunction, Decreased strength, Decreased mobility, Impaired flexibility, Abnormal gait, Decreased range of motion, Cardiopulmonary status limiting activity, Decreased activity tolerance, Improper body mechanics  Visit Diagnosis: Abnormal posture  Chronic pain of right knee  Difficulty in walking, not elsewhere classified  Muscle weakness (generalized)  Unsteadiness on feet  Other abnormalities of gait and mobility     Problem List Patient Active Problem List   Diagnosis Date Noted   Cough 07/07/2020   Atypical chest pain 06/10/2020   Fibromyalgia 02/24/2020   Hypertension associated with type 2 diabetes mellitus ([place]) 11/10/2019   Hyperlipidemia associated with type 2 diabetes mellitus ([place]) 11/10/2019   Diabetes mellitus ([place]) 10/23/2019   Hypertension associated with diabetes ([place]) 10/23/2019   Mixed diabetic hyperlipidemia associated with type 2 diabetes mellitus ([place]) 10/23/2019   Vitamin D deficiency 10/23/2019   B12 deficiency 10/23/2019   Absolute anemia 10/23/2019   BMI 50.0-59.9, adult ([place]) 10/23/2019   Class 3 severe obesity with serious  comorbidity and body mass index (BMI) of 50.0 to 59.9 in adult ([place]) 06/16/2015   Abdominal wall mass of suprapubic region 06/16/2015    Annamarie Major) Kyrillos Adams MPT 11/29/2020, 6:33 PM  [facility] [address] [place], [place], [address] Phone: [phone]   Fax:  [phone]  Name: Jennifer Dickson MRN: [id number] Date of Birth: [birth year]

## 2020-12-01 ENCOUNTER — Ambulatory Visit (HOSPITAL_BASED_OUTPATIENT_CLINIC_OR_DEPARTMENT_OTHER): Payer: Medicaid Other | Admitting: Physical Therapy

## 2020-12-07 ENCOUNTER — Ambulatory Visit (HOSPITAL_BASED_OUTPATIENT_CLINIC_OR_DEPARTMENT_OTHER): Payer: Medicaid Other | Admitting: Physical Therapy

## 2020-12-14 ENCOUNTER — Ambulatory Visit (HOSPITAL_BASED_OUTPATIENT_CLINIC_OR_DEPARTMENT_OTHER): Payer: Medicaid Other | Admitting: Physical Therapy

## 2020-12-16 ENCOUNTER — Ambulatory Visit (HOSPITAL_BASED_OUTPATIENT_CLINIC_OR_DEPARTMENT_OTHER): Payer: Medicaid Other | Admitting: Physical Therapy

## 2020-12-20 ENCOUNTER — Encounter (HOSPITAL_BASED_OUTPATIENT_CLINIC_OR_DEPARTMENT_OTHER): Payer: Self-pay | Admitting: Physical Therapy

## 2020-12-20 ENCOUNTER — Ambulatory Visit (HOSPITAL_BASED_OUTPATIENT_CLINIC_OR_DEPARTMENT_OTHER): Payer: Medicaid Other | Attending: Orthopedic Surgery | Admitting: Physical Therapy

## 2020-12-20 ENCOUNTER — Other Ambulatory Visit: Payer: Self-pay

## 2020-12-20 DIAGNOSIS — G8929 Other chronic pain: Secondary | ICD-10-CM | POA: Diagnosis present

## 2020-12-20 DIAGNOSIS — M25561 Pain in right knee: Secondary | ICD-10-CM | POA: Diagnosis present

## 2020-12-20 DIAGNOSIS — M6281 Muscle weakness (generalized): Secondary | ICD-10-CM

## 2020-12-20 DIAGNOSIS — R262 Difficulty in walking, not elsewhere classified: Secondary | ICD-10-CM | POA: Diagnosis present

## 2020-12-20 DIAGNOSIS — R2681 Unsteadiness on feet: Secondary | ICD-10-CM | POA: Diagnosis present

## 2020-12-20 NOTE — Therapy (Addendum)
[facility] [address] [place], [place], [address] Phone: [phone]   Fax:  [phone]  Physical Therapy Treatment  Patient Details  Name: Jennifer Dickson MRN: [id number] Date of Birth: Mar 27, 1975 Referring Provider (PT): Edmonia Lynch, MD   Encounter Date: 12/20/2020   PT End of Session - 12/20/20 1214     Visit Number 5    Number of Visits 11    Date for PT Re-Evaluation 02/04/21    Authorization Type [facility] [place] Access    Authorization Time Period requesting 2x/week x 6 weeks    Authorization - Visit Number 9   used PT visits for another body part earlier this year   Authorization - Number of Visits 57    PT Start Time 1208    PT Stop Time 1247    PT Time Calculation (min) 39 min    Activity Tolerance Patient limited by pain    Behavior During Therapy [facility] for tasks assessed/performed             Past Medical History:  Diagnosis Date   Anemia    Anxiety    Back pain    Carpal tunnel syndrome    Cervical radiculopathy    Cervical radiculopathy    Chest pain    Chronic fatigue syndrome    Constipation    COVID-19    Depression    Diabetes mellitus without complication ([name])    Diverticulosis    DJD (degenerative joint disease)    DVT (deep vein thrombosis) in pregnancy    DVT (deep venous thrombosis) ([name])    Endometrioma    Endometriosis    Fatty liver    Fibromyalgia    Fibromyalgia    Gallbladder problem    Heartburn    High blood pressure    High cholesterol    Hypertension    IBS (irritable bowel syndrome)    Interstitial cystitis    Joint pain    Knee dislocation    Mini stroke    Osteoarthritis    Osteoarthritis    Palpitations    Pinched nerve    Sleep apnea    SOB (shortness of breath) on exertion    Swallowing difficulty    Swelling of both lower extremities    Tendonitis    Varicose veins of both legs with edema    Vitamin D deficiency     Past Surgical History:   Procedure Laterality Date   [place]    There were no vitals filed for this visit.   Subjective Assessment - 12/20/20 1255     Subjective "My knee keeps coming out really hurts"    Currently in Pain? Yes    Pain Score 6     Pain Location Knee    Pain Orientation Left;Medial;Upper;Lower;Anterior    Pain Descriptors / Indicators Aching;Dull;Sharp    Pain Type Chronic pain    Pain Onset More than a month ago                                          PT Short Term Goals - 11/17/20 1504       PT SHORT TERM GOAL #1   Title Pt will be independent and compliant with initial HEP.    Status On-going      PT SHORT TERM GOAL #2  Title Pt will use Lt knee brace consistently during activity to reduce pain and assist with knee stability for improved activity tolerance.    Baseline Pt reports she does not tolerate knee brace - feels too big    Status Deferred               PT Long Term Goals - 11/17/20 1504       PT LONG TERM GOAL #1   Title Pt will be ind with advanced HEP    Status New      PT LONG TERM GOAL #2   Title Pt will be able to tolerate standing for light household tasks for up to 10 min    Baseline 5 min    Status New      PT LONG TERM GOAL #3   Title Pt will be able to ambulate at least 300' without exacerbation of Lt knee pain using SPC.    Baseline 230', increased pain with this    Status Revised      PT LONG TERM GOAL #4   Title Pt will achieve improved Lt knee strength of at least 4/5 for improved transfers and gait.    Baseline 3+/5    Status On-going      PT LONG TERM GOAL #5   Title Pt will be able to participate fully with min need for breaks and without exacerbation of Lt knee pain during aquatic PT to demo improved endurance and ther ex tolerance for improved strength    Time 8    Period Weeks    Status New    Target Date 01/12/21           Pt seen for aquatic therapy  today.  Treatment took place in water 3.25-4.8 ft in depth at the [facility] pool. Temp of water was 91.  Pt entered/exited the pool via stairs step to pattern independently with bilat rail. Pt entered water for aquatic therapy for first time and was introduced to principles and therapeutic effects of water as she ambulated and acclimated to pool.  Amb for warm up in all depths forward, backward and side stepping holding to yellow noodle.  VC for improved gait. Core activation by holding hand buoys by sides and amb x 4 widths   Seated stretching: gastroc, hamstrings.   exercises knee flex 2x10; add/abd 2x10 Cycling on 3rd water step 2 x 1 minute   Standing Holding to pool wall/hand buoys as tolerated to engage core Knee flex x10 Hip add/abd 2x10 Hip extension 2x10 Hip flex 2x10 Marching 2x10   Gait training supported by hand buoys. Cadence slowed. VC for increased knee flex during swing. Gait antalgic submerged     Pt requires buoyancy for support and to offload joints with strengthening exercises. Viscosity of the water is needed for resistance of strengthening; water current perturbations provides challenge to standing balance unsupported, requiring increased core activation.         Plan - 12/20/20 1230     Clinical Impression Statement Pt has cancelled multiple appointments. Has actually only participated in 1 aquatic session.  Presents today with no change in pain and complaints of knee dislocating multiple times.  She reports increased discomfort in LB with exercises today further limiting her tolerance. She is apprehnsive to bend left knee, needs encouragement. Is most comfortable with cycle motion.  Soft tissue about area tender to palpation. Pt instructed that we won't see much progress without consistant participation in [place] as well  as completing HEP.    Personal Factors and Comorbidities Comorbidity 3+;Fitness;Time since onset of injury/illness/exacerbation              Patient will benefit from skilled therapeutic intervention in order to improve the following deficits and impairments:     Visit Diagnosis: Chronic pain of right knee  Difficulty in walking, not elsewhere classified  Muscle weakness (generalized)  Unsteadiness on feet     Problem List Patient Active Problem List   Diagnosis Date Noted   Cough 07/07/2020   Atypical chest pain 06/10/2020   Fibromyalgia 02/24/2020   Hypertension associated with type 2 diabetes mellitus ([place]) 11/10/2019   Hyperlipidemia associated with type 2 diabetes mellitus ([place]) 11/10/2019   Diabetes mellitus ([place]) 10/23/2019   Hypertension associated with diabetes ([place]) 10/23/2019   Mixed diabetic hyperlipidemia associated with type 2 diabetes mellitus ([place]) 10/23/2019   Vitamin D deficiency 10/23/2019   B12 deficiency 10/23/2019   Absolute anemia 10/23/2019   BMI 50.0-59.9, adult ([place]) 10/23/2019   Class 3 severe obesity with serious comorbidity and body mass index (BMI) of 50.0 to 59.9 in adult ([place]) 06/16/2015   Abdominal wall mass of suprapubic region 06/16/2015    Annamarie Major) Tzirel Leonor MPT 12/20/2020, 1:02 PM  PHYSICAL THERAPY DISCHARGE SUMMARY  Visits from Start of Care: 5  Current functional level related to goals / functional outcomes: Pt did not return since last visit.  Goals not met.   Remaining deficits: See above   Education / Equipment: HEP  Patient agrees to discharge. Patient goals were not met. Patient is being discharged due to not returning since the last visit.  Baruch Merl, PT 02/08/21 9:17 AM  [facility] [place], [place], [address] Phone: [phone]   Fax:  [phone]  Name: Jennifer Dickson MRN: [id number] Date of Birth: 1975-06-04

## 2020-12-21 ENCOUNTER — Ambulatory Visit (INDEPENDENT_AMBULATORY_CARE_PROVIDER_SITE_OTHER): Payer: Medicaid Other | Admitting: Adult Health

## 2020-12-22 ENCOUNTER — Ambulatory Visit (HOSPITAL_BASED_OUTPATIENT_CLINIC_OR_DEPARTMENT_OTHER): Payer: Medicaid Other | Admitting: Physical Therapy

## 2020-12-27 ENCOUNTER — Ambulatory Visit (HOSPITAL_BASED_OUTPATIENT_CLINIC_OR_DEPARTMENT_OTHER): Payer: Medicaid Other | Admitting: Physical Therapy

## 2020-12-29 ENCOUNTER — Ambulatory Visit (HOSPITAL_BASED_OUTPATIENT_CLINIC_OR_DEPARTMENT_OTHER): Payer: Medicaid Other | Admitting: Physical Therapy

## 2020-12-30 ENCOUNTER — Ambulatory Visit (INDEPENDENT_AMBULATORY_CARE_PROVIDER_SITE_OTHER): Payer: Medicaid Other | Admitting: Adult Health

## 2021-01-04 ENCOUNTER — Ambulatory Visit: Payer: Medicaid Other | Admitting: Physical Therapy

## 2021-02-11 ENCOUNTER — Other Ambulatory Visit (INDEPENDENT_AMBULATORY_CARE_PROVIDER_SITE_OTHER): Payer: Self-pay | Admitting: Adult Health

## 2021-03-01 ENCOUNTER — Encounter: Payer: Self-pay | Admitting: Gastroenterology

## 2021-03-28 ENCOUNTER — Ambulatory Visit: Payer: Medicaid Other | Admitting: Gastroenterology

## 2021-04-11 ENCOUNTER — Ambulatory Visit: Payer: Medicaid Other | Admitting: Obstetrics and Gynecology

## 2021-04-11 ENCOUNTER — Other Ambulatory Visit: Payer: Self-pay

## 2021-05-31 ENCOUNTER — Encounter: Payer: Self-pay | Admitting: Obstetrics & Gynecology

## 2021-05-31 ENCOUNTER — Ambulatory Visit: Payer: Medicaid Other | Admitting: Obstetrics & Gynecology

## 2021-05-31 DIAGNOSIS — D219 Benign neoplasm of connective and other soft tissue, unspecified: Secondary | ICD-10-CM | POA: Insufficient documentation

## 2021-08-17 ENCOUNTER — Encounter (INDEPENDENT_AMBULATORY_CARE_PROVIDER_SITE_OTHER): Payer: Self-pay

## 2021-10-07 ENCOUNTER — Other Ambulatory Visit: Payer: Self-pay | Admitting: Acute Care

## 2021-10-13 ENCOUNTER — Other Ambulatory Visit: Payer: Self-pay | Admitting: Gastroenterology

## 2021-10-13 DIAGNOSIS — K219 Gastro-esophageal reflux disease without esophagitis: Secondary | ICD-10-CM

## 2021-10-19 ENCOUNTER — Other Ambulatory Visit: Payer: Medicaid Other

## 2021-10-21 ENCOUNTER — Ambulatory Visit
Admission: RE | Admit: 2021-10-21 | Discharge: 2021-10-21 | Disposition: A | Discharge status: Home / Self Care | Payer: Medicaid Other | Source: Ambulatory Visit | Attending: Gastroenterology | Admitting: Gastroenterology

## 2021-10-21 DIAGNOSIS — K219 Gastro-esophageal reflux disease without esophagitis: Secondary | ICD-10-CM

## 2021-11-01 ENCOUNTER — Other Ambulatory Visit: Payer: Self-pay | Admitting: Gastroenterology

## 2021-11-01 DIAGNOSIS — R1013 Epigastric pain: Secondary | ICD-10-CM

## 2021-11-15 ENCOUNTER — Ambulatory Visit: Payer: No Typology Code available for payment source | Admitting: Rehabilitative and Restorative Service Providers"

## 2021-11-28 ENCOUNTER — Telehealth: Payer: Self-pay

## 2021-11-28 NOTE — Telephone Encounter (Signed)
Called and left voicemail for patient to call office back to schedule a follow up to get her cleared for her shoulder surgery.

## 2021-12-02 ENCOUNTER — Telehealth: Payer: Self-pay | Admitting: Pulmonary Disease

## 2021-12-02 NOTE — Telephone Encounter (Signed)
Fax received from Dr. Edmonia Lynch with [facility] to perform a Right Shoulder Scope Rotator Cuff Repair on patient.  Patient needs surgery clearance. Surgery is PENDING. Patient was seen on 12/31/2019. Office protocol is a risk assessment can be sent to surgeon if patient has been seen in 60 days or less.   Sending to Dr Vaughan Browner for risk assessment or recommendations if patient needs to be seen in office prior to surgical procedure.    Patient has office visit with Dr Vaughan Browner on 12/13/2021 to get cleared for surgery.

## 2021-12-06 ENCOUNTER — Inpatient Hospital Stay: Admission: RE | Admit: 2021-12-06 | Payer: Medicaid Other | Source: Ambulatory Visit

## 2021-12-08 ENCOUNTER — Ambulatory Visit: Payer: Medicaid Other | Attending: Physician Assistant | Admitting: Physical Therapy

## 2021-12-08 ENCOUNTER — Other Ambulatory Visit: Payer: Self-pay

## 2021-12-08 DIAGNOSIS — M6281 Muscle weakness (generalized): Secondary | ICD-10-CM | POA: Diagnosis present

## 2021-12-08 DIAGNOSIS — M25522 Pain in left elbow: Secondary | ICD-10-CM | POA: Insufficient documentation

## 2021-12-08 NOTE — Therapy (Signed)
OUTPATIENT PHYSICAL THERAPY UPPER EXTREMITY EVALUATION   Patient Name: Jennifer Dickson MRN: [id number] DOB:04/21/1975, 46 y.o., female Today's Date: 12/08/2021  END OF SESSION:  PT End of Session - 12/08/21 1355     Visit Number 1    Date for PT Re-Evaluation 03/02/22    Authorization Type [place] Access Medicaid    PT Start Time 1400    PT Stop Time 1440    PT Time Calculation (min) 40 min    Activity Tolerance Patient tolerated treatment well             Past Medical History:  Diagnosis Date   Anemia    Anxiety    Back pain    Carpal tunnel syndrome    Cervical radiculopathy    Chest pain    Chronic fatigue syndrome    Constipation    COVID-19    Depression    Diabetes mellitus without complication ([place])    Diverticulosis    DJD (degenerative joint disease)    DVT (deep venous thrombosis) ([name])    Endometrioma of cesarean scar    Fatty liver    Fibromyalgia    Heartburn    High cholesterol    Hypertension    IBS (irritable bowel syndrome)    Interstitial cystitis    Joint pain    Knee dislocation    Mini stroke    Osteoarthritis    Palpitations    Pinched nerve    Sleep apnea    SOB (shortness of breath) on exertion    Swallowing difficulty    Swelling of both lower extremities    Tendonitis    Varicose veins of both legs with edema    Vitamin D deficiency    Past Surgical History:  Procedure Laterality Date   [place]   Patient Active Problem List   Diagnosis Date Noted   Fibroid, subserosal 05/31/2021   Fibromyalgia 02/24/2020   Hypertension associated with type 2 diabetes mellitus ([place]) 11/10/2019   Hyperlipidemia associated with type 2 diabetes mellitus ([place]) 11/10/2019   Diabetes mellitus ([place]) 10/23/2019   Vitamin D deficiency 10/23/2019   B12 deficiency 10/23/2019   Class 3 severe obesity with serious comorbidity and body mass index (BMI) of 50.0 to 59.9 in adult ([place]) 06/16/2015   Abdominal  wall mass of suprapubic region 06/16/2015    PCP: Junie Spencer PA  REFERRING PROVIDER: Junie Spencer PA  REFERRING DIAG: B09.62 lateral epicondylitis, left elbow  THERAPY DIAG:  Pain in left elbow  Muscle weakness (generalized)  Rationale for Evaluation and Treatment: Rehabilitation  ONSET DATE: 2 years  SUBJECTIVE:  SUBJECTIVE STATEMENT: Left lateral elbow pain and locking up;  hard to straighten it and pops.  Numbness in fingers;  Going on 2 years but worsened recently.  Thinks it was result of covid vaccination.  Dropping things.  Tried some ex's bicep curls and shoulder lateral raises but painful.   Haven't tried brace.    PERTINENT HISTORY: Diabetes, Fibromyalgia; cervical stenosis; DDD spine;  declined NCV "not a needle person" Right hand dominant Awaiting right rotator cuff repair surgery when cleared by pulmonologist Had collapsed lung when had covid "Heart issues"  PAIN:  PAIN:  Are you having pain? Yes NPRS scale: 7/10 Pain location: left lateral elbow pain Aggravating factors: washing dishes, activities over a long period of time; gripping; holding a 2# dumbbell; turning in bed;  Relieving factors: ice numbs but just for a little, rubbing it   PRECAUTIONS: None  WEIGHT BEARING RESTRICTIONS: No  FALLS:  Has patient fallen in last 6 months? No   OCCUPATION: Not working currently  Writing hobby, taking care of dog  PLOF: Independent  PATIENT GOALS: be able to move arm with less limitation;  I'm trying to lose weight but painful left arm      OBJECTIVE:   DIAGNOSTIC FINDINGS:  Not for arm; has had CT and MRI for neck  PATIENT SURVEYS :  Quick Dash 69.9%  severe impairment  COGNITION: Overall cognitive status: Within functional limits for tasks assessed      UPPER  EXTREMITY ROM:   painful with left elbow extension; mild pain with elbow flexion and supination/prontation  UPPER EXTREMITY MMT: Biceps 3/5, triceps 3/5 Pronation/supination 4/5 Wrist extension 3/5 Wrist flexion 4/5 Grip right 50# bent elbow, straight elbow 40# Left 35# bent elbow, 20# straight elbow very painful  + lateral epicondylagia tests:  pain with resisted wrist extension and middle finger resisted extension  PALPATION:  Marked tenderness lateral epicondyle, wrist extensor wad as well as biceps and triceps distal insertion   TODAY'S TREATMENT:                                                                                                                                         DATE: 12/08/21 Patient given tennis elbow brace and instructed in how to position and when to wear Discussed using ice cup massage 2-3 min Discussed using butter knife handle to massage forearm 3 minutes Check all possible CPT codes: 97164 - PT Re-evaluation, 97110- Therapeutic Exercise, 97112- Neuro Re-education, 97140 - Manual Therapy, 97530 - Therapeutic Activities, 97535 - Self Care, 97014 - Electrical stimulation (unattended), [id number] - Electrical stimulation (Manual), [id number] - Iontophoresis, and [id number] - Ultrasound    Check all conditions that are expected to impact treatment: Morbid obesity, Diabetes mellitus, and Musculoskeletal disorders   If treatment provided at initial evaluation, no treatment charged due to lack of authorization.       PATIENT EDUCATION: Education details: Educated patient  on anatomy and physiology of current symptoms, prognosis, plan of care as well as initial self care strategies to promote recovery  Person educated: Patient Education method: Explanation Education comprehension: verbalized understanding  HOME EXERCISE PROGRAM: To be started  ASSESSMENT:  CLINICAL IMPRESSION: Patient is a 46 y.o. female who was seen today for physical therapy evaluation and treatment for  left elbow pain.   Signs and symptoms consistent with lateral epicondylalgia with a high level of symptom irritability.  She has full elbow, wrist and hand ROM but painful with terminal elbow extension and supination/pronation.  Very painful resisted wrist extension and middle finger extension.  Elbow and wrist strength grossly 3/5.  Decreased grip strength 20# on left (40# on right).  Marked tenderness lateral epicondyle and soft tissue of wrist extensors, biceps and triceps.  She would benefit from PT interventions to address these deficits.     OBJECTIVE IMPAIRMENTS: decreased activity tolerance, decreased knowledge of condition, decreased ROM, decreased strength, increased fascial restrictions, impaired perceived functional ability, impaired UE functional use, and pain.   ACTIVITY LIMITATIONS: carrying, lifting, and hygiene/grooming  PARTICIPATION LIMITATIONS: meal prep, cleaning, and laundry  PERSONAL FACTORS: Time since onset of injury/illness/exacerbation and 1-2 comorbidities: diabetes, fibromyalgia  are also affecting patient's functional outcome.   REHAB POTENTIAL: Good  CLINICAL DECISION MAKING: Stable/uncomplicated  EVALUATION COMPLEXITY: Low  GOALS: Goals reviewed with patient? Yes  SHORT TERM GOALS: Target date: 01/05/2022    The patient will demonstrate knowledge of basic self care strategies and exercises to promote healing   Baseline: Goal status: INITIAL  2.  The patient will report a 30% improvement in pain levels with functional activities which are currently difficult including washing dishes, turning in bed, writing, typing, gripping Baseline:  Goal status: INITIAL  3.  Quick DASH improved to 59% Baseline:  Goal status: INITIAL   LONG TERM GOALS: Target date: 03/02/2022      The patient will be independent in a safe self progression of a home exercise program to promote further recovery of function   Baseline:  lacks knowledge of self care and HEP Goal  status: INITIAL  2.  The patient will report a 50% improvement in pain levels with functional activities which are currently difficult including washing dishes, turning in bed, writing/typing, gripping Baseline:   pain is severe at least 7/10 with functional activities Goal status: INITIAL  3.  Grip strength improved to 25# on left with straight elbow  Baseline: 20# Goal status: INITIAL  4.  Left elbow and wrist strength improved to grossly 4/5 needed to push up to get out of bed, wash dishes Baseline: grossly 3/5 Goal status: INITIAL  5.  Quick DASH score improved to 55% indicating improved function with less pain Baseline:  65.9% Goal status: INITIAL    PLAN: PT FREQUENCY: 1x/week  PT DURATION: 12 weeks  PLANNED INTERVENTIONS: Therapeutic exercises, Therapeutic activity, Neuromuscular re-education, Patient/Family education, Self Care, Joint mobilization, Dry Needling, Electrical stimulation, Cryotherapy, Moist heat, Taping, Ultrasound, Ionotophoresis '4mg'$ /ml Dexamethasone, Manual therapy, and Re-evaluation  PLAN FOR NEXT SESSION: high symptom irritability;  ionto if cert signed by medical provider; U/S; manual therapy, ice massage; patient education on symptom management; low level exercise  Ruben Im, PT 12/08/21 5:11 PM Phone: [phone] Fax: [phone]

## 2021-12-13 ENCOUNTER — Ambulatory Visit: Payer: Medicaid Other | Admitting: Pulmonary Disease

## 2021-12-15 NOTE — Telephone Encounter (Signed)
Called and left voicemail for patient to call office back for surgical clearance appointment.

## 2021-12-16 NOTE — Telephone Encounter (Signed)
Pt was scheduled for appt for surgery clearance 12/13/21. She cancelled bc she is sick. I offered appt. She states she will have to call us back. She understands we will not be able to clear her for surgery until she is seen in clinic. Will forward to surg pool for f/u on this later. Thanks.

## 2021-12-16 NOTE — Telephone Encounter (Signed)
Patient has not been seen in office in 2 years. Please make office appointment for surgical clearance.

## 2021-12-22 ENCOUNTER — Ambulatory Visit: Payer: Medicaid Other | Admitting: Physical Therapy

## 2021-12-27 NOTE — Telephone Encounter (Signed)
Patient will call office back when ready to schedule surgical clearance. Closing encounter till patient calls back.

## 2021-12-29 ENCOUNTER — Ambulatory Visit: Payer: Medicaid Other | Attending: Physician Assistant | Admitting: Physical Therapy

## 2021-12-29 ENCOUNTER — Encounter: Payer: Self-pay | Admitting: Physical Therapy

## 2021-12-29 DIAGNOSIS — M25522 Pain in left elbow: Secondary | ICD-10-CM | POA: Insufficient documentation

## 2021-12-29 DIAGNOSIS — M6281 Muscle weakness (generalized): Secondary | ICD-10-CM | POA: Diagnosis present

## 2021-12-29 NOTE — Therapy (Signed)
OUTPATIENT PHYSICAL THERAPY TREATMENT NOTE   Patient Name: Jennifer Dickson MRN: [id number] DOB:February 20, 1975, 46 y.o., female Today's Date: 12/29/2021  PCP:  Junie Spencer PA REFERRING PROVIDER: Junie Spencer PA  END OF SESSION:   PT End of Session - 12/29/21 1440     Visit Number 2    Date for PT Re-Evaluation 03/02/22    Authorization Type [place] Access Medicaid - submitting for 1x/week x 8 weeks 01/05/22 - 03/02/22    Authorization - Visit Number 2    Authorization - Number of Visits 4    PT Start Time 8676    PT Stop Time 1525    PT Time Calculation (min) 38 min    Activity Tolerance Patient tolerated treatment well    Behavior During Therapy WFL for tasks assessed/performed             Past Medical History:  Diagnosis Date   Anemia    Anxiety    Back pain    Carpal tunnel syndrome    Cervical radiculopathy    Chest pain    Chronic fatigue syndrome    Constipation    COVID-19    Depression    Diabetes mellitus without complication ([name])    Diverticulosis    DJD (degenerative joint disease)    DVT (deep venous thrombosis) ([name])    Endometrioma of cesarean scar    Fatty liver    Fibromyalgia    Heartburn    High cholesterol    Hypertension    IBS (irritable bowel syndrome)    Interstitial cystitis    Joint pain    Knee dislocation    Mini stroke    Osteoarthritis    Palpitations    Pinched nerve    Sleep apnea    SOB (shortness of breath) on exertion    Swallowing difficulty    Swelling of both lower extremities    Tendonitis    Varicose veins of both legs with edema    Vitamin D deficiency    Past Surgical History:  Procedure Laterality Date   [place]   Patient Active Problem List   Diagnosis Date Noted   Fibroid, subserosal 05/31/2021   Fibromyalgia 02/24/2020   Hypertension associated with type 2 diabetes mellitus ([place]) 11/10/2019   Hyperlipidemia associated with type 2 diabetes mellitus ([place])  11/10/2019   Diabetes mellitus ([place]) 10/23/2019   Vitamin D deficiency 10/23/2019   B12 deficiency 10/23/2019   Class 3 severe obesity with serious comorbidity and body mass index (BMI) of 50.0 to 59.9 in adult ([place]) 06/16/2015   Abdominal wall mass of suprapubic region 06/16/2015    REFERRING DIAG: M77.12 lateral epicondylitis, left elbow  THERAPY DIAG:  Pain in left elbow  Muscle weakness (generalized)  Rationale for Evaluation and Treatment Rehabilitation  PERTINENT HISTORY:  Diabetes, Fibromyalgia; cervical stenosis; DDD spine;  declined NCV "not a needle person" Right hand dominant Awaiting right rotator cuff repair surgery when cleared by pulmonologist Had collapsed lung when had covid "Heart issues"  PRECAUTIONS: n/a  OCCUPATION: Not working currently  Writing hobby, taking care of dog   PLOF: Independent   PATIENT GOALS: be able to move arm with less limitation;  I'm trying to lose weight but painful left arm   SUBJECTIVE:  SUBJECTIVE STATEMENT:  I was sick so missed last appointment.  Elbow is about the same. I have worn the elbow brace when I remembered to but not sure if it has helped.  I have it on today.  I haven't tried the massage or ice to elbow.   PAIN:  Are you having pain? Yes NPRS scale: 5/10 Pain location: left lateral elbow pain Aggravating factors: washing dishes, activities over a long period of time; gripping; holding a 2# dumbbell; turning in bed;  Relieving factors: ice numbs but just for a little, rubbing it    OBJECTIVE: (objective measures completed at initial evaluation unless otherwise dated)   DIAGNOSTIC FINDINGS:  Not for arm; has had CT and MRI for neck   PATIENT SURVEYS :  Quick Dash 69.9%  severe impairment   COGNITION: Overall cognitive status:  Within functional limits for tasks assessed                                       UPPER EXTREMITY ROM:   painful with left elbow extension; mild pain with elbow flexion and supination/prontation   UPPER EXTREMITY MMT: Biceps 3/5, triceps 3/5 Pronation/supination 4/5 Wrist extension 3/5 Wrist flexion 4/5 Grip right 50# bent elbow, straight elbow 40# Left 35# bent elbow, 20# straight elbow very painful   + lateral epicondylagia tests:  pain with resisted wrist extension and middle finger resisted extension   PALPATION:  Marked tenderness lateral epicondyle, wrist extensor wad as well as biceps and triceps distal insertion              TODAY'S TREATMENT:                                                                                                                            DATE: 12/29/21  IASTM  and manual STM to Lt wrist extensors and tendons at elbow Sub-max wrist extension isometrics 10x5" (HEP) Supported Lt wrist circumduction x 10 each way (HEP) Ultrasound to Lt wrist extensor tendons at lateral epicondyle x 8': 1MHz 20% duty cycle 1W/cm Ice stick massage to Lt elbow and soft tissues x 3' HEP: wrist extension isometrics and supported Lt wrist circumduction - handouts issued Pt education: continue using elbow brace, try icing  DATE: 12/08/21 Patient given tennis elbow brace and instructed in how to position and when to wear Discussed using ice cup massage 2-3 min Discussed using butter knife handle to massage forearm 3 minutes Check all possible CPT codes: 97164 - PT Re-evaluation, 97110- Therapeutic Exercise, 97112- Neuro Re-education, 97140 - Manual Therapy, 97530 - Therapeutic Activities, 97535 - Self Care, 97014 - Electrical stimulation (unattended), [id number] - Electrical stimulation (Manual), [id number] - Iontophoresis, and [id number] - Ultrasound                       Check all conditions that are expected to  impact treatment: Morbid obesity, Diabetes mellitus, and Musculoskeletal  disorders    If treatment provided at initial evaluation, no treatment charged due to lack of authorization.                   PATIENT EDUCATION: Education details: Educated patient on anatomy and physiology of current symptoms, prognosis, plan of care as well as initial self care strategies to promote recovery  Person educated: Patient Education method: Explanation Education comprehension: verbalized understanding   HOME EXERCISE PROGRAM: Access Code: [email or web address] URL: [email or web address] Date: 12/29/2021 Prepared by: Venetia Night Javontay Vandam  Exercises - Isometric Wrist Extension Pronated  - 3 x daily - 7 x weekly - 1 sets - 10 reps - 5 hold - Supported Wrist Circumduction  - 3 x daily - 7 x weekly - 1 sets - 10 reps   ASSESSMENT:   CLINICAL IMPRESSION: Patient due for more authorization today but has not had any treatment to date secondary to illness causing cancelled previous appointment.  She has been partially compliant with wearing Lt elbow brace but has not tried icing or self-massage.  She is unsure whether the brace has helped, and states the pain varies in intensity without pattern.  She was able to tolerate sub-max wrist extension isometrics today and gentle supported wrist A/ROM for circumduction.  She continues to be acutely tender to tendons surrounding lateral epicondyle.  MD had not yet returned signed POC so unable to initiate iontophoresis today.  PT focused on IASTM, manual massage, isometrics, gentle mobility, ultrasound and ice massage to pain region today.  Good tolerance and expressed understanding of plan to start HEP and be more compliant with self-care for elbow pain between visits.  Pt will continue to benefit from skilled PT to address pain, inflammation and reduced functional use of of Lt UE due to painful Lt elbow.     OBJECTIVE IMPAIRMENTS: decreased activity tolerance, decreased knowledge of condition, decreased ROM, decreased strength, increased  fascial restrictions, impaired perceived functional ability, impaired UE functional use, and pain.    ACTIVITY LIMITATIONS: carrying, lifting, and hygiene/grooming   PARTICIPATION LIMITATIONS: meal prep, cleaning, and laundry   PERSONAL FACTORS: Time since onset of injury/illness/exacerbation and 1-2 comorbidities: diabetes, fibromyalgia  are also affecting patient's functional outcome.    REHAB POTENTIAL: Good   CLINICAL DECISION MAKING: Stable/uncomplicated   EVALUATION COMPLEXITY: Low   GOALS: Goals reviewed with patient? Yes   SHORT TERM GOALS: Target date: 01/05/2022     The patient will demonstrate knowledge of basic self care strategies and exercises to promote healing   Baseline: Goal status: ongoing   2.  The patient will report a 30% improvement in pain levels with functional activities which are currently difficult including washing dishes, turning in bed, writing, typing, gripping Baseline:  Goal status: INITIAL   3.  Quick DASH improved to 59% Baseline:  Goal status: INITIAL     LONG TERM GOALS: Target date: 03/02/2022       The patient will be independent in a safe self progression of a home exercise program to promote further recovery of function   Baseline:  lacks knowledge of self care and HEP Goal status: ongoing   2.  The patient will report a 50% improvement in pain levels with functional activities which are currently difficult including washing dishes, turning in bed, writing/typing, gripping Baseline:   pain is severe at least 7/10 with functional activities Goal status: INITIAL   3.  Grip strength improved to 25# on left  with straight elbow  Baseline: 20# Goal status: INITIAL   4.  Left elbow and wrist strength improved to grossly 4/5 needed to push up to get out of bed, wash dishes Baseline: grossly 3/5 Goal status: INITIAL   5.  Quick DASH score improved to 55% indicating improved function with less pain Baseline:  65.9% Goal status:  INITIAL       PLAN: PT FREQUENCY: 1x/week   PT DURATION: 12 weeks   PLANNED INTERVENTIONS: Therapeutic exercises, Therapeutic activity, Neuromuscular re-education, Patient/Family education, Self Care, Joint mobilization, Dry Needling, Electrical stimulation, Cryotherapy, Moist heat, Taping, Ultrasound, Ionotophoresis '4mg'$ /ml Dexamethasone, Manual therapy, and Re-evaluation   PLAN FOR NEXT SESSION: high symptom irritability;  ionto if cert signed by medical provider; U/S; manual therapy, ice massage; patient education on symptom management; low level exercise    Elyana Grabski, PT 12/29/21 3:49 PM

## 2022-01-04 ENCOUNTER — Inpatient Hospital Stay: Admission: RE | Admit: 2022-01-04 | Payer: Medicaid Other | Source: Ambulatory Visit

## 2022-01-05 ENCOUNTER — Ambulatory Visit: Payer: Medicaid Other | Admitting: Physical Therapy

## 2022-01-05 ENCOUNTER — Telehealth: Payer: Self-pay | Admitting: Physical Therapy

## 2022-01-05 NOTE — Telephone Encounter (Signed)
Called regarding missed appt.  Left message to call facility before next appt on Jan 4th to ensure insurance authorization has been approved

## 2022-01-05 NOTE — Telephone Encounter (Deleted)
Called regarding missed appt.  Left message to call facility before next appt on Jan 4th to ensure insurance authorization has been approved

## 2022-01-12 ENCOUNTER — Ambulatory Visit: Payer: Medicaid Other | Admitting: Physical Therapy

## 2022-01-19 ENCOUNTER — Ambulatory Visit: Payer: Medicaid Other | Attending: Physician Assistant | Admitting: Physical Therapy

## 2022-01-19 DIAGNOSIS — M6281 Muscle weakness (generalized): Secondary | ICD-10-CM

## 2022-01-19 DIAGNOSIS — M25522 Pain in left elbow: Secondary | ICD-10-CM

## 2022-01-19 NOTE — Therapy (Signed)
OUTPATIENT PHYSICAL THERAPY TREATMENT NOTE   Patient Name: Jennifer Dickson MRN: [id number] DOB:[birth year], 47 y.o., female Today's Date: 01/19/2022  PCP:  Junie Spencer PA REFERRING PROVIDER: Junie Spencer PA  END OF SESSION:   PT End of Session - 01/19/22 1406     Visit Number 3    Number of Visits 4    Date for PT Re-Evaluation 03/02/22    Authorization Type [place] [employer] - Eval + 3 visits until 2/2    Authorization - Visit Number 3    PT Start Time 5697    PT Stop Time 9480    PT Time Calculation (min) 38 min             Past Medical History:  Diagnosis Date   Anemia    Anxiety    Back pain    Carpal tunnel syndrome    Cervical radiculopathy    Chest pain    Chronic fatigue syndrome    Constipation    COVID-19    Depression    Diabetes mellitus without complication ([name])    Diverticulosis    DJD (degenerative joint disease)    DVT (deep venous thrombosis) ([name])    Endometrioma of cesarean scar    Fatty liver    Fibromyalgia    Heartburn    High cholesterol    Hypertension    IBS (irritable bowel syndrome)    Interstitial cystitis    Joint pain    Knee dislocation    Mini stroke    Osteoarthritis    Palpitations    Pinched nerve    Sleep apnea    SOB (shortness of breath) on exertion    Swallowing difficulty    Swelling of both lower extremities    Tendonitis    Varicose veins of both legs with edema    Vitamin D deficiency    Past Surgical History:  Procedure Laterality Date   [place]   Patient Active Problem List   Diagnosis Date Noted   Fibroid, subserosal 05/31/2021   Fibromyalgia 02/24/2020   Hypertension associated with type 2 diabetes mellitus ([place]) 11/10/2019   Hyperlipidemia associated with type 2 diabetes mellitus ([place]) 11/10/2019   Diabetes mellitus ([place]) 10/23/2019   Vitamin D deficiency 10/23/2019   B12 deficiency 10/23/2019   Class 3 severe obesity with serious comorbidity  and body mass index (BMI) of 50.0 to 59.9 in adult ([place]) 06/16/2015   Abdominal wall mass of suprapubic region 06/16/2015    REFERRING DIAG: M77.12 lateral epicondylitis, left elbow  THERAPY DIAG:  Pain in left elbow  Muscle weakness (generalized)  Rationale for Evaluation and Treatment Rehabilitation  PERTINENT HISTORY:  Diabetes, Fibromyalgia; cervical stenosis; DDD spine;  declined NCV "not a needle person" Right hand dominant Awaiting right rotator cuff repair surgery when cleared by pulmonologist Had collapsed lung when had covid "Heart issues"  PRECAUTIONS: n/a  OCCUPATION: Not working currently  Writing hobby, taking care of dog   PLOF: Independent   PATIENT GOALS: be able to move arm with less limitation;  I'm trying to lose weight but painful left arm   SUBJECTIVE:  SUBJECTIVE STATEMENT:  My elbow is no worse.  I have a new problem with my side (flank) today.   PAIN:  Are you having pain? Yes NPRS scale: 4/10 Pain location: left lateral elbow pain Aggravating factors: washing dishes, activities over a long period of time; gripping; holding a 2# dumbbell; turning in bed;  Relieving factors: ice numbs but just for a little, rubbing it    OBJECTIVE: (objective measures completed at initial evaluation unless otherwise dated)   DIAGNOSTIC FINDINGS:  Not for arm; has had CT and MRI for neck   PATIENT SURVEYS :  Quick Dash 69.9%  severe impairment   COGNITION: Overall cognitive status: Within functional limits for tasks assessed                                       UPPER EXTREMITY ROM:   painful with left elbow extension; mild pain with elbow flexion and supination/prontation   UPPER EXTREMITY MMT: Biceps 3/5, triceps 3/5 Pronation/supination 4/5 Wrist extension 3/5 Wrist  flexion 4/5 Grip right 50# bent elbow, straight elbow 40# Left 35# bent elbow, 20# straight elbow very painful   + lateral epicondylagia tests:  pain with resisted wrist extension and middle finger resisted extension   PALPATION:  Marked tenderness lateral epicondyle, wrist extensor wad as well as biceps and triceps distal insertion              TODAY'S TREATMENT:    01/19/22: Review of current HEP and self care strategies: ice cup massage to control pain followed by self massage, continued use of elbow brace Discussed importance of continued ROM of elbow, wrist and shoulder 5-7 reps performed at each joint Trial of self mob with towel squeeze 7x Manual therapy: soft tissue mobilization to wrist extensors, biceps, triceps, deltoids  Ionto patch 4 ma/ml dexamethasone to lateral epicondyle 4-6 hour wear time; pt instructions given                                                                                                                              DATE: 12/29/21  IASTM  and manual STM to Lt wrist extensors and tendons at elbow Sub-max wrist extension isometrics 10x5" (HEP) Supported Lt wrist circumduction x 10 each way (HEP) Ultrasound to Lt wrist extensor tendons at lateral epicondyle x 8': 1MHz 20% duty cycle 1W/cm Ice stick massage to Lt elbow and soft tissues x 3' HEP: wrist extension isometrics and supported Lt wrist circumduction - handouts issued Pt education: continue using elbow brace, try icing  DATE: 12/08/21 Patient given tennis elbow brace and instructed in how to position and when to wear Discussed using ice cup massage 2-3 min Discussed using butter knife handle to massage forearm 3 minutes Check all possible CPT codes: 97164 - PT Re-evaluation, 97110- Therapeutic Exercise, 21308- Neuro Re-education, 97140 - Manual Therapy, 97530 - Therapeutic Activities,  97535 - Self Care, [id number] - Electrical stimulation (unattended), [phone] - Electrical stimulation (Manual), [id number]  - Iontophoresis, and [id number] - Ultrasound                       Check all conditions that are expected to impact treatment: Morbid obesity, Diabetes mellitus, and Musculoskeletal disorders    If treatment provided at initial evaluation, no treatment charged due to lack of authorization.                   PATIENT EDUCATION: Education details: Educated patient on anatomy and physiology of current symptoms, prognosis, plan of care as well as initial self care; ionto instructions; strategies to promote recovery  Person educated: Patient Education method: Explanation Education comprehension: verbalized understanding   HOME EXERCISE PROGRAM: Access Code: [email or web address] URL: [email or web address] Date: 12/29/2021 Prepared by: Venetia Night Beuhring  Exercises - Isometric Wrist Extension Pronated  - 3 x daily - 7 x weekly - 1 sets - 10 reps - 5 hold - Supported Wrist Circumduction  - 3 x daily - 7 x weekly - 1 sets - 10 reps   ASSESSMENT:   CLINICAL IMPRESSION: Continues to have high symptom irritability.   Low tolerance to ROM and manual therapy.  Initiated trial of ionto to reduce inflammation.  Discussed other strategies for inflammatory control.       OBJECTIVE IMPAIRMENTS: decreased activity tolerance, decreased knowledge of condition, decreased ROM, decreased strength, increased fascial restrictions, impaired perceived functional ability, impaired UE functional use, and pain.    ACTIVITY LIMITATIONS: carrying, lifting, and hygiene/grooming   PARTICIPATION LIMITATIONS: meal prep, cleaning, and laundry   PERSONAL FACTORS: Time since onset of injury/illness/exacerbation and 1-2 comorbidities: diabetes, fibromyalgia  are also affecting patient's functional outcome.    REHAB POTENTIAL: Good   CLINICAL DECISION MAKING: Stable/uncomplicated   EVALUATION COMPLEXITY: Low   GOALS: Goals reviewed with patient? Yes   SHORT TERM GOALS: Target date: 01/05/2022     The patient will  demonstrate knowledge of basic self care strategies and exercises to promote healing   Baseline: Goal status: ongoing   2.  The patient will report a 30% improvement in pain levels with functional activities which are currently difficult including washing dishes, turning in bed, writing, typing, gripping Baseline:  Goal status: INITIAL   3.  Quick DASH improved to 59% Baseline:  Goal status: INITIAL     LONG TERM GOALS: Target date: 03/02/2022       The patient will be independent in a safe self progression of a home exercise program to promote further recovery of function   Baseline:  lacks knowledge of self care and HEP Goal status: ongoing   2.  The patient will report a 50% improvement in pain levels with functional activities which are currently difficult including washing dishes, turning in bed, writing/typing, gripping Baseline:   pain is severe at least 7/10 with functional activities Goal status: INITIAL   3.  Grip strength improved to 25# on left with straight elbow  Baseline: 20# Goal status: INITIAL   4.  Left elbow and wrist strength improved to grossly 4/5 needed to push up to get out of bed, wash dishes Baseline: grossly 3/5 Goal status: INITIAL   5.  Quick DASH score improved to 55% indicating improved function with less pain Baseline:  65.9% Goal status: INITIAL       PLAN: PT FREQUENCY: 1x/week   PT DURATION: 12 weeks   PLANNED INTERVENTIONS: Therapeutic exercises,  Therapeutic activity, Neuromuscular re-education, Patient/Family education, Self Care, Joint mobilization, Dry Needling, Electrical stimulation, Cryotherapy, Moist heat, Taping, Ultrasound, Ionotophoresis '4mg'$ /ml Dexamethasone, Manual therapy, and Re-evaluation   PLAN FOR NEXT SESSION:  Quick DASH, grip strength recheck next visit;  high symptom irritability; assess response to  ionto #1; U/S; manual therapy, ice massage; patient education on symptom management; low level exercise  Ruben Im, PT 01/19/22 2:44 PM Phone: [phone] Fax: [phone]

## 2022-01-19 NOTE — Patient Instructions (Signed)

## 2022-01-26 ENCOUNTER — Ambulatory Visit: Payer: Medicaid Other | Admitting: Physical Therapy

## 2022-01-26 DIAGNOSIS — M25522 Pain in left elbow: Secondary | ICD-10-CM

## 2022-01-26 DIAGNOSIS — M6281 Muscle weakness (generalized): Secondary | ICD-10-CM

## 2022-01-26 NOTE — Therapy (Signed)
OUTPATIENT PHYSICAL THERAPY TREATMENT NOTE/DISCHARGE SUMMARY   Patient Name: Jennifer Dickson MRN: [id number] DOB:09/04/1975, 47 y.o., female Today's Date: 01/26/2022  PCP:  Junie Spencer PA REFERRING PROVIDER: Junie Spencer PA  END OF SESSION:   PT End of Session - 01/26/22 1355     Visit Number 4    Number of Visits 4    Date for PT Re-Evaluation 03/02/22    Authorization Type [place] [employer] Medicaid - Eval + 3 visits until 2/2    Authorization - Visit Number 4    Authorization - Number of Visits 4    PT Start Time 1400    PT Stop Time 4782   PT Time Calculation (min) 39 min    Activity Tolerance Patient tolerated treatment well             Past Medical History:  Diagnosis Date   Anemia    Anxiety    Back pain    Carpal tunnel syndrome    Cervical radiculopathy    Chest pain    Chronic fatigue syndrome    Constipation    COVID-19    Depression    Diabetes mellitus without complication ([name])    Diverticulosis    DJD (degenerative joint disease)    DVT (deep venous thrombosis) ([name])    Endometrioma of cesarean scar    Fatty liver    Fibromyalgia    Heartburn    High cholesterol    Hypertension    IBS (irritable bowel syndrome)    Interstitial cystitis    Joint pain    Knee dislocation    Mini stroke    Osteoarthritis    Palpitations    Pinched nerve    Sleep apnea    SOB (shortness of breath) on exertion    Swallowing difficulty    Swelling of both lower extremities    Tendonitis    Varicose veins of both legs with edema    Vitamin D deficiency    Past Surgical History:  Procedure Laterality Date   [place]   Patient Active Problem List   Diagnosis Date Noted   Fibroid, subserosal 05/31/2021   Fibromyalgia 02/24/2020   Hypertension associated with type 2 diabetes mellitus ([place]) 11/10/2019   Hyperlipidemia associated with type 2 diabetes mellitus ([place]) 11/10/2019   Diabetes mellitus ([place]) 10/23/2019    Vitamin D deficiency 10/23/2019   B12 deficiency 10/23/2019   Class 3 severe obesity with serious comorbidity and body mass index (BMI) of 50.0 to 59.9 in adult ([place]) 06/16/2015   Abdominal wall mass of suprapubic region 06/16/2015    REFERRING DIAG: M77.12 lateral epicondylitis, left elbow  THERAPY DIAG:  Pain in left elbow  Muscle weakness (generalized)  Rationale for Evaluation and Treatment Rehabilitation  PERTINENT HISTORY:  Diabetes, Fibromyalgia; cervical stenosis; DDD spine;  declined NCV "not a needle person" Right hand dominant Awaiting right rotator cuff repair surgery when cleared by pulmonologist Had collapsed lung when had covid "Heart issues"  PRECAUTIONS: n/a  OCCUPATION: Not working currently  Writing hobby, taking care of dog   PLOF: Independent   PATIENT GOALS: be able to move arm with less limitation;  I'm trying to lose weight but painful left arm   SUBJECTIVE:  SUBJECTIVE STATEMENT:   The elbow is hurting today 5-6/10.  I think the ionto patch helped.  This past week I was doing a lot of cleaning. On laptop more b/c has back in school. I'm better overall about 40% better.   PAIN:  Are you having pain? Yes NPRS scale: 5-6/10 Pain location: left lateral elbow pain Aggravating factors: washing dishes, activities over a long period of time; gripping; holding a 2# dumbbell; turning in bed;  Relieving factors: ice numbs but just for a little, rubbing it    OBJECTIVE: (objective measures completed at initial evaluation unless otherwise dated)   DIAGNOSTIC FINDINGS:  Not for arm; has had CT and MRI for neck   PATIENT SURVEYS :  Quick Dash 69.9%  severe impairment 1/18:  61%   COGNITION: Overall cognitive status: Within functional limits for tasks assessed                                        UPPER EXTREMITY ROM:   painful with left elbow extension; mild pain with elbow flexion and supination/prontation   UPPER EXTREMITY MMT: Biceps 3/5, triceps 3/5 Pronation/supination 4/5 Wrist extension 3/5 Wrist flexion 4/5 Grip right 50# bent elbow, straight elbow 40# Left 35# bent elbow, 20# straight elbow very painful   + lateral epicondylagia tests:  pain with resisted wrist extension and middle finger resisted extension   1/18:  left 40#  bent elbow;  26# straight elbow ; biceps and triceps 3+; wrist extension 4/5   PALPATION:  Marked tenderness lateral epicondyle, wrist extensor wad as well as biceps and triceps distal insertion              TODAY'S TREATMENT:    1/18: Elbow, wrist and hand active ROM- significantly less painful in all ranges MMT elbow,wrist, grip Quick DASH Instruction in mob with movement in doorway with lateral glide with gripping a towel 10x (pt given You Tube instructions) Ionto patch 4 ma/ml dexamethasone to lateral epicondyle 4-6 hour wear time Discussed progress toward goals and plan for discharge to self management       01/19/22: Review of current HEP and self care strategies: ice cup massage to control pain followed by self massage, continued use of elbow brace Discussed importance of continued ROM of elbow, wrist and shoulder 5-7 reps performed at each joint Trial of self mob with towel squeeze 7x Manual therapy: soft tissue mobilization to wrist extensors, biceps, triceps, deltoids  Ionto patch 4 ma/ml dexamethasone to lateral epicondyle 4-6 hour wear time; pt instructions given                                                                                                                              DATE: 12/29/21  IASTM  and manual STM to Lt wrist extensors and tendons at elbow Sub-max wrist extension isometrics 10x5" (HEP)  Supported Lt wrist circumduction x 10 each way (HEP) Ultrasound to Lt wrist extensor  tendons at lateral epicondyle x 8': 1MHz 20% duty cycle 1W/cm Ice stick massage to Lt elbow and soft tissues x 3' HEP: wrist extension isometrics and supported Lt wrist circumduction - handouts issued Pt education: continue using elbow brace, try icing   PATIENT EDUCATION: Education details: Educated patient on anatomy and physiology of current symptoms, prognosis, plan of care as well as initial self care; ionto instructions; strategies to promote recovery  Person educated: Patient Education method: Explanation Education comprehension: verbalized understanding   HOME EXERCISE PROGRAM: You Tube: elbow mobilization with movement Access Code: [email or web address] URL: [email or web address] Date: 12/29/2021 Prepared by: Venetia Night Beuhring  Exercises - Isometric Wrist Extension Pronated  - 3 x daily - 7 x weekly - 1 sets - 10 reps - 5 hold - Supported Wrist Circumduction  - 3 x daily - 7 x weekly - 1 sets - 10 reps   ASSESSMENT:   CLINICAL IMPRESSION: The patient has met partial rehab goals, with improvements in pain reduction, outcome score, ROM, strength and functional mobility.  A HEP and self care strategies have been established and anticipate further improvements over time with regular performance of the program.  Recommend discharge from PT at this time. Patient has some other health and musculoskeletal issues she plans to follow up with her doctor about and may return to PT at some point to address neck pain and stiffness.       OBJECTIVE IMPAIRMENTS: decreased activity tolerance, decreased knowledge of condition, decreased ROM, decreased strength, increased fascial restrictions, impaired perceived functional ability, impaired UE functional use, and pain.    ACTIVITY LIMITATIONS: carrying, lifting, and hygiene/grooming   PARTICIPATION LIMITATIONS: meal prep, cleaning, and laundry   PERSONAL FACTORS: Time since onset of injury/illness/exacerbation and 1-2 comorbidities:  diabetes, fibromyalgia  are also affecting patient's functional outcome.    REHAB POTENTIAL: Good   CLINICAL DECISION MAKING: Stable/uncomplicated   EVALUATION COMPLEXITY: Low   GOALS: Goals reviewed with patient? Yes   SHORT TERM GOALS: Target date: 01/05/2022     The patient will demonstrate knowledge of basic self care strategies and exercises to promote healing   Baseline: Goal status: ongoing   2.  The patient will report a 30% improvement in pain levels with functional activities which are currently difficult including washing dishes, turning in bed, writing, typing, gripping Baseline:  Goal status: met 1/18   3.  Quick DASH improved to 59% Baseline:  Goal status: partially met     LONG TERM GOALS: Target date: 03/02/2022       The patient will be independent in a safe self progression of a home exercise program to promote further recovery of function   Baseline:  lacks knowledge of self care and HEP Goal status: met 1/18   2.  The patient will report a 50% improvement in pain levels with functional activities which are currently difficult including washing dishes, turning in bed, writing/typing, gripping Baseline:   pain is severe at least 7/10 with functional activities Goal status: partially met   3.  Grip strength improved to 25# on left with straight elbow  Baseline: 20# Goal status: met 1/18   4.  Left elbow and wrist strength improved to grossly 4/5 needed to push up to get out of bed, wash dishes Baseline: grossly 3/5 Goal status: partially met   5.  Quick DASH score improved to 55% indicating improved function with less pain Baseline:  65.9% Goal status: partially met  PHYSICAL THERAPY DISCHARGE SUMMARY  Visits from Start of Care: 4  Current functional level related to goals / functional outcomes: See clinical impressions above   Remaining deficits: As above   Education / Equipment: HEP   Patient agrees to discharge. Patient goals were  partially met. Patient is being discharged due to being pleased with the current functional level.  Ruben Im, PT 01/26/22 4:47 PM Phone: [phone] Fax: [phone]

## 2022-02-02 ENCOUNTER — Other Ambulatory Visit: Payer: Medicaid Other

## 2022-02-02 ENCOUNTER — Encounter: Payer: Medicaid Other | Admitting: Physical Therapy

## 2022-02-09 ENCOUNTER — Encounter: Payer: Medicaid Other | Admitting: Physical Therapy

## 2022-04-04 NOTE — Telephone Encounter (Signed)
Spoke with patient, she advised she is not going to have surgery. Advised to call the office back if this ever changed. Closing encounte r

## 2022-04-11 ENCOUNTER — Other Ambulatory Visit: Payer: Medicaid Other

## 2022-04-14 ENCOUNTER — Ambulatory Visit
Admission: RE | Admit: 2022-04-14 | Discharge: 2022-04-14 | Disposition: A | Discharge status: Home / Self Care | Payer: Medicaid Other | Source: Ambulatory Visit | Attending: Gastroenterology | Admitting: Gastroenterology

## 2022-04-14 DIAGNOSIS — R1013 Epigastric pain: Secondary | ICD-10-CM

## 2022-04-14 MED ORDER — IOPAMIDOL (ISOVUE-300) INJECTION 61%
100.0000 mL | Freq: Once | INTRAVENOUS | Status: AC | PRN
  Administered 2022-04-14: 100 mL via INTRAVENOUS
